# Patient Record
Sex: Male | Born: 1955
Health system: Southern US, Community
[De-identification: ages and names within clinical notes are randomized; demographics above are authoritative.]

## PROBLEM LIST (undated history)

## (undated) DIAGNOSIS — E7800 Pure hypercholesterolemia, unspecified: Secondary | ICD-10-CM

## (undated) DIAGNOSIS — Z85828 Personal history of other malignant neoplasm of skin: Secondary | ICD-10-CM

## (undated) DIAGNOSIS — F419 Anxiety disorder, unspecified: Secondary | ICD-10-CM

## (undated) DIAGNOSIS — Z8489 Family history of other specified conditions: Secondary | ICD-10-CM

## (undated) DIAGNOSIS — G4733 Obstructive sleep apnea (adult) (pediatric): Secondary | ICD-10-CM

## (undated) DIAGNOSIS — E785 Hyperlipidemia, unspecified: Secondary | ICD-10-CM

## (undated) DIAGNOSIS — Z9289 Personal history of other medical treatment: Secondary | ICD-10-CM

## (undated) DIAGNOSIS — R351 Nocturia: Secondary | ICD-10-CM

## (undated) DIAGNOSIS — E291 Testicular hypofunction: Secondary | ICD-10-CM

## (undated) DIAGNOSIS — C801 Malignant (primary) neoplasm, unspecified: Secondary | ICD-10-CM

## (undated) DIAGNOSIS — G4730 Sleep apnea, unspecified: Secondary | ICD-10-CM

## (undated) DIAGNOSIS — K224 Dyskinesia of esophagus: Secondary | ICD-10-CM

## (undated) DIAGNOSIS — M1990 Unspecified osteoarthritis, unspecified site: Secondary | ICD-10-CM

## (undated) DIAGNOSIS — M109 Gout, unspecified: Secondary | ICD-10-CM

## (undated) DIAGNOSIS — I10 Essential (primary) hypertension: Secondary | ICD-10-CM

## (undated) DIAGNOSIS — I2510 Atherosclerotic heart disease of native coronary artery without angina pectoris: Secondary | ICD-10-CM

## (undated) DIAGNOSIS — M67921 Unspecified disorder of synovium and tendon, right upper arm: Secondary | ICD-10-CM

## (undated) DIAGNOSIS — E790 Hyperuricemia without signs of inflammatory arthritis and tophaceous disease: Secondary | ICD-10-CM

## (undated) DIAGNOSIS — M25511 Pain in right shoulder: Secondary | ICD-10-CM

## (undated) DIAGNOSIS — E039 Hypothyroidism, unspecified: Secondary | ICD-10-CM

## (undated) DIAGNOSIS — Z8719 Personal history of other diseases of the digestive system: Secondary | ICD-10-CM

## (undated) DIAGNOSIS — R7303 Prediabetes: Secondary | ICD-10-CM

## (undated) DIAGNOSIS — M25512 Pain in left shoulder: Secondary | ICD-10-CM

## (undated) DIAGNOSIS — M75101 Unspecified rotator cuff tear or rupture of right shoulder, not specified as traumatic: Secondary | ICD-10-CM

## (undated) DIAGNOSIS — K219 Gastro-esophageal reflux disease without esophagitis: Secondary | ICD-10-CM

## (undated) HISTORY — PX: CARPAL TUNNEL RELEASE: SHX101

## (undated) HISTORY — PX: SHOULDER SURGERY: SHX246

## (undated) HISTORY — PX: SHOULDER ARTHROSCOPY: SHX128

## (undated) HISTORY — PX: FOOT SURGERY: SHX648

## (undated) HISTORY — PX: KNEE SURGERY: SHX244

## (undated) HISTORY — PX: CATARACT EXTRACTION W/ INTRAOCULAR LENS  IMPLANT, BILATERAL: SHX1307

## (undated) HISTORY — PX: ANAL FISSURE REPAIR: SHX2312

## (undated) HISTORY — PX: SHOULDER ARTHROSCOPY WITH ROTATOR CUFF REPAIR AND SUBACROMIAL DECOMPRESSION: SHX5686

## (undated) HISTORY — PX: PLANTAR FASCIA SURGERY: SHX746

## (undated) HISTORY — DX: Testicular hypofunction: E29.1

## (undated) HISTORY — PX: PR UNLISTED PROCEDURE SHOULDER: 23929

## (undated) HISTORY — DX: Hypothyroidism, unspecified: E03.9

## (undated) HISTORY — DX: Unspecified osteoarthritis, unspecified site: M19.90

## (undated) HISTORY — PX: CARPAL TUNNEL RELEASE: SHX5017

## (undated) HISTORY — DX: Essential (primary) hypertension: I10

## (undated) HISTORY — PX: SURGICAL HX OTHER: 99

## (undated) HISTORY — PX: PR UNLISTED PROCEDURE FEMUR/KNEE: 27599

---
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
## 2002-05-20 ENCOUNTER — Emergency Department (HOSPITAL_COMMUNITY): Admission: EM | Admit: 2002-05-20 | Discharge: 2002-05-20 | Payer: Self-pay | Admitting: Emergency Medicine

## 2002-05-20 ENCOUNTER — Encounter: Payer: Self-pay | Admitting: Emergency Medicine

## 2002-05-29 ENCOUNTER — Encounter: Admission: RE | Admit: 2002-05-29 | Discharge: 2002-05-29 | Payer: Self-pay | Admitting: Internal Medicine

## 2002-05-29 ENCOUNTER — Encounter: Payer: Self-pay | Admitting: Internal Medicine

## 2003-03-04 ENCOUNTER — Encounter: Admission: RE | Admit: 2003-03-04 | Discharge: 2003-03-04 | Payer: Self-pay | Admitting: *Deleted

## 2003-03-04 ENCOUNTER — Encounter: Payer: Self-pay | Admitting: *Deleted

## 2003-03-05 ENCOUNTER — Encounter: Admission: RE | Admit: 2003-03-05 | Discharge: 2003-03-05 | Payer: Self-pay | Admitting: Internal Medicine

## 2003-03-05 ENCOUNTER — Encounter: Payer: Self-pay | Admitting: Internal Medicine

## 2003-07-25 ENCOUNTER — Emergency Department (HOSPITAL_COMMUNITY): Admission: AD | Admit: 2003-07-25 | Discharge: 2003-07-25 | Payer: Self-pay | Admitting: Family Medicine

## 2004-07-17 ENCOUNTER — Encounter: Admission: RE | Admit: 2004-07-17 | Discharge: 2004-07-17 | Payer: Self-pay | Admitting: Internal Medicine

## 2004-07-31 ENCOUNTER — Encounter: Admission: RE | Admit: 2004-07-31 | Discharge: 2004-08-15 | Payer: Self-pay | Admitting: Neurosurgery

## 2006-06-18 DIAGNOSIS — Z8719 Personal history of other diseases of the digestive system: Secondary | ICD-10-CM

## 2006-06-18 HISTORY — DX: Personal history of other diseases of the digestive system: Z87.19

## 2006-11-05 ENCOUNTER — Ambulatory Visit: Payer: Self-pay | Admitting: Family Medicine

## 2006-11-19 ENCOUNTER — Ambulatory Visit: Payer: Self-pay | Admitting: Family Medicine

## 2006-11-19 LAB — CONVERTED CEMR LAB
ALT: 33 units/L (ref 0–40)
AST: 41 units/L — ABNORMAL HIGH (ref 0–37)
Albumin: 4.2 g/dL (ref 3.5–5.2)
Alkaline Phosphatase: 53 units/L (ref 39–117)
Basophils Absolute: 0 10*3/uL (ref 0.0–0.1)
Calcium: 9.3 mg/dL (ref 8.4–10.5)
Chloride: 101 meq/L (ref 96–112)
Eosinophils Absolute: 0.1 10*3/uL (ref 0.0–0.6)
GFR calc non Af Amer: 74 mL/min
HDL: 40.1 mg/dL (ref >39.0)
MCHC: 34.7 g/dL (ref 30.0–36.0)
MCV: 89.8 fL (ref 78.0–100.0)
Platelets: 197 10*3/uL (ref 150–400)
RBC: 4.61 M/uL (ref 4.22–5.81)
RDW: 13.6 % (ref 11.5–14.6)
TSH: >100 microintl units/mL — ABNORMAL HIGH (ref 0.35–5.50)
Total CHOL/HDL Ratio: 7.2
Triglycerides: 162 mg/dL — ABNORMAL HIGH (ref 0–149)
WBC: 6.7 10*3/uL (ref 4.5–10.5)

## 2006-11-26 ENCOUNTER — Ambulatory Visit: Payer: Self-pay | Admitting: Family Medicine

## 2006-12-11 ENCOUNTER — Ambulatory Visit: Payer: Self-pay | Admitting: Pulmonary Disease

## 2006-12-26 ENCOUNTER — Encounter: Payer: Self-pay | Admitting: Family Medicine

## 2007-01-13 ENCOUNTER — Ambulatory Visit (HOSPITAL_BASED_OUTPATIENT_CLINIC_OR_DEPARTMENT_OTHER): Admission: RE | Admit: 2007-01-13 | Discharge: 2007-01-13 | Payer: Self-pay | Admitting: Pulmonary Disease

## 2007-01-29 ENCOUNTER — Ambulatory Visit: Payer: Self-pay | Admitting: Pulmonary Disease

## 2007-02-03 ENCOUNTER — Ambulatory Visit: Payer: Self-pay | Admitting: Pulmonary Disease

## 2007-03-05 ENCOUNTER — Ambulatory Visit: Payer: Self-pay | Admitting: Family Medicine

## 2007-03-05 DIAGNOSIS — E119 Type 2 diabetes mellitus without complications: Secondary | ICD-10-CM | POA: Insufficient documentation

## 2007-03-05 DIAGNOSIS — E039 Hypothyroidism, unspecified: Secondary | ICD-10-CM | POA: Insufficient documentation

## 2007-03-05 DIAGNOSIS — M1990 Unspecified osteoarthritis, unspecified site: Secondary | ICD-10-CM | POA: Insufficient documentation

## 2007-03-05 DIAGNOSIS — E782 Mixed hyperlipidemia: Secondary | ICD-10-CM | POA: Insufficient documentation

## 2007-03-05 DIAGNOSIS — F329 Major depressive disorder, single episode, unspecified: Secondary | ICD-10-CM | POA: Insufficient documentation

## 2007-03-05 DIAGNOSIS — E785 Hyperlipidemia, unspecified: Secondary | ICD-10-CM

## 2007-03-05 DIAGNOSIS — I10 Essential (primary) hypertension: Secondary | ICD-10-CM | POA: Insufficient documentation

## 2007-03-06 ENCOUNTER — Ambulatory Visit: Payer: Self-pay | Admitting: Pulmonary Disease

## 2007-03-07 ENCOUNTER — Encounter: Payer: Self-pay | Admitting: Family Medicine

## 2007-03-07 LAB — CONVERTED CEMR LAB
Alkaline Phosphatase: 45 units/L (ref 39–117)
Cholesterol: 116 mg/dL (ref 0–200)
Creatinine,U: 141.2 mg/dL
HDL: 30.2 mg/dL — ABNORMAL LOW (ref >39.0)
Microalb, Ur: 0.2 mg/dL (ref 0.0–1.9)
Total Bilirubin: 0.5 mg/dL (ref 0.3–1.2)
Total CHOL/HDL Ratio: 3.8
Total Protein: 6.3 g/dL (ref 6.0–8.3)
Triglycerides: 88 mg/dL (ref 0–149)

## 2007-05-06 ENCOUNTER — Telehealth (INDEPENDENT_AMBULATORY_CARE_PROVIDER_SITE_OTHER): Payer: Self-pay | Admitting: *Deleted

## 2007-05-09 ENCOUNTER — Ambulatory Visit: Payer: Self-pay | Admitting: Family Medicine

## 2007-05-09 DIAGNOSIS — J018 Other acute sinusitis: Secondary | ICD-10-CM | POA: Insufficient documentation

## 2007-06-02 ENCOUNTER — Encounter (INDEPENDENT_AMBULATORY_CARE_PROVIDER_SITE_OTHER): Payer: Self-pay | Admitting: *Deleted

## 2007-06-16 ENCOUNTER — Ambulatory Visit: Payer: Self-pay | Admitting: Pulmonary Disease

## 2007-06-16 DIAGNOSIS — G4733 Obstructive sleep apnea (adult) (pediatric): Secondary | ICD-10-CM | POA: Insufficient documentation

## 2007-06-17 ENCOUNTER — Ambulatory Visit: Payer: Self-pay | Admitting: Family Medicine

## 2007-06-17 DIAGNOSIS — M722 Plantar fascial fibromatosis: Secondary | ICD-10-CM | POA: Insufficient documentation

## 2007-09-17 ENCOUNTER — Ambulatory Visit: Payer: Self-pay | Admitting: Family Medicine

## 2007-12-13 ENCOUNTER — Encounter: Payer: Self-pay | Admitting: Pulmonary Disease

## 2007-12-17 ENCOUNTER — Ambulatory Visit: Payer: Self-pay | Admitting: Family Medicine

## 2008-02-03 ENCOUNTER — Ambulatory Visit: Payer: Self-pay | Admitting: Family Medicine

## 2008-02-03 LAB — CONVERTED CEMR LAB
Blood in Urine, dipstick: NEGATIVE
Glucose, Urine, Semiquant: NEGATIVE
Protein, U semiquant: NEGATIVE
WBC Urine, dipstick: NEGATIVE
pH: 7

## 2008-02-05 LAB — CONVERTED CEMR LAB
AST: 24 units/L (ref 0–37)
Alkaline Phosphatase: 47 units/L (ref 39–117)
Basophils Absolute: 0 10*3/uL (ref 0.0–0.1)
Chloride: 103 meq/L (ref 96–112)
Eosinophils Absolute: 0.1 10*3/uL (ref 0.0–0.7)
GFR calc non Af Amer: 83 mL/min
HDL: 35.3 mg/dL — ABNORMAL LOW (ref >39.0)
Hgb A1c MFr Bld: 6.2 % — ABNORMAL HIGH (ref 4.6–6.0)
MCHC: 35 g/dL (ref 30.0–36.0)
MCV: 91 fL (ref 78.0–100.0)
Microalb Creat Ratio: 1.3 mg/g (ref 0.0–30.0)
Neutrophils Relative %: 49 % (ref 43.0–77.0)
PSA: 0.4 ng/mL (ref 0.10–4.00)
Platelets: 231 10*3/uL (ref 150–400)
Potassium: 3.7 meq/L (ref 3.5–5.1)
RDW: 12.4 % (ref 11.5–14.6)
Sodium: 139 meq/L (ref 135–145)
Total Bilirubin: 0.7 mg/dL (ref 0.3–1.2)
VLDL: 14 mg/dL (ref 0–40)
WBC: 4.8 10*3/uL (ref 4.5–10.5)

## 2008-02-10 ENCOUNTER — Ambulatory Visit: Payer: Self-pay | Admitting: Family Medicine

## 2008-04-20 ENCOUNTER — Telehealth: Payer: Self-pay | Admitting: Family Medicine

## 2008-06-18 HISTORY — PX: EYE SURGERY: SHX253

## 2008-06-25 ENCOUNTER — Telehealth: Payer: Self-pay | Admitting: Family Medicine

## 2008-07-06 ENCOUNTER — Ambulatory Visit: Payer: Self-pay | Admitting: Family Medicine

## 2008-07-14 ENCOUNTER — Encounter: Payer: Self-pay | Admitting: Family Medicine

## 2008-08-02 ENCOUNTER — Encounter: Payer: Self-pay | Admitting: Family Medicine

## 2008-08-02 ENCOUNTER — Ambulatory Visit (HOSPITAL_COMMUNITY): Admission: RE | Admit: 2008-08-02 | Discharge: 2008-08-03 | Payer: Self-pay | Admitting: Orthopedic Surgery

## 2008-08-12 ENCOUNTER — Encounter: Payer: Self-pay | Admitting: Family Medicine

## 2009-01-11 ENCOUNTER — Telehealth: Payer: Self-pay | Admitting: Family Medicine

## 2009-02-01 ENCOUNTER — Ambulatory Visit: Payer: Self-pay | Admitting: Family Medicine

## 2009-02-01 LAB — CONVERTED CEMR LAB
Bilirubin Urine: NEGATIVE
Glucose, Urine, Semiquant: NEGATIVE
Urobilinogen, UA: 0.2
WBC Urine, dipstick: NEGATIVE
pH: 7

## 2009-02-02 LAB — CONVERTED CEMR LAB
ALT: 31 units/L (ref 0–53)
AST: 32 units/L (ref 0–37)
Albumin: 3.9 g/dL (ref 3.5–5.2)
BUN: 21 mg/dL (ref 6–23)
Basophils Relative: 0.7 % (ref 0.0–3.0)
Chloride: 109 meq/L (ref 96–112)
Cholesterol: 133 mg/dL (ref 0–200)
Eosinophils Relative: 2.5 % (ref 0.0–5.0)
HCT: 37.8 % — ABNORMAL LOW (ref 39.0–52.0)
Hemoglobin: 13.2 g/dL (ref 13.0–17.0)
Hgb A1c MFr Bld: 6 % (ref 4.6–6.5)
LDL Cholesterol: 75 mg/dL (ref 0–99)
Lymphs Abs: 2.2 10*3/uL (ref 0.7–4.0)
MCV: 92.2 fL (ref 78.0–100.0)
Monocytes Absolute: 0.5 10*3/uL (ref 0.1–1.0)
Monocytes Relative: 7.8 % (ref 3.0–12.0)
Neutro Abs: 3.1 10*3/uL (ref 1.4–7.7)
Platelets: 205 10*3/uL (ref 150.0–400.0)
Potassium: 4.1 meq/L (ref 3.5–5.1)
Sodium: 143 meq/L (ref 135–145)
TSH: 2.79 microintl units/mL (ref 0.35–5.50)
Total Protein: 7 g/dL (ref 6.0–8.3)
WBC: 5.9 10*3/uL (ref 4.5–10.5)

## 2009-02-09 ENCOUNTER — Telehealth: Payer: Self-pay | Admitting: Family Medicine

## 2009-02-10 ENCOUNTER — Ambulatory Visit: Payer: Self-pay | Admitting: Family Medicine

## 2009-02-11 ENCOUNTER — Telehealth: Payer: Self-pay | Admitting: Family Medicine

## 2009-02-11 LAB — CONVERTED CEMR LAB: Testosterone: 254.73 ng/dL — ABNORMAL LOW (ref 350.00–890.00)

## 2009-02-15 ENCOUNTER — Ambulatory Visit: Payer: Self-pay | Admitting: Family Medicine

## 2009-02-15 DIAGNOSIS — E291 Testicular hypofunction: Secondary | ICD-10-CM | POA: Insufficient documentation

## 2009-02-16 ENCOUNTER — Telehealth (INDEPENDENT_AMBULATORY_CARE_PROVIDER_SITE_OTHER): Payer: Self-pay | Admitting: *Deleted

## 2009-02-17 ENCOUNTER — Ambulatory Visit: Payer: Self-pay

## 2009-02-17 ENCOUNTER — Encounter: Payer: Self-pay | Admitting: Cardiology

## 2009-02-17 DIAGNOSIS — Z9289 Personal history of other medical treatment: Secondary | ICD-10-CM

## 2009-02-17 HISTORY — DX: Personal history of other medical treatment: Z92.89

## 2009-02-18 ENCOUNTER — Telehealth: Payer: Self-pay | Admitting: Family Medicine

## 2009-02-23 ENCOUNTER — Telehealth: Payer: Self-pay | Admitting: Family Medicine

## 2009-03-15 ENCOUNTER — Telehealth: Payer: Self-pay | Admitting: Family Medicine

## 2009-04-14 ENCOUNTER — Ambulatory Visit (HOSPITAL_COMMUNITY): Admission: RE | Admit: 2009-04-14 | Discharge: 2009-04-14 | Payer: Self-pay | Admitting: Orthopedic Surgery

## 2009-06-18 HISTORY — PX: LEG SURGERY: SHX1003

## 2009-11-18 ENCOUNTER — Ambulatory Visit: Payer: Self-pay | Admitting: Family Medicine

## 2009-11-18 DIAGNOSIS — K089 Disorder of teeth and supporting structures, unspecified: Secondary | ICD-10-CM | POA: Insufficient documentation

## 2009-11-18 DIAGNOSIS — J019 Acute sinusitis, unspecified: Secondary | ICD-10-CM | POA: Insufficient documentation

## 2009-11-24 ENCOUNTER — Telehealth: Payer: Self-pay | Admitting: Family Medicine

## 2009-12-05 ENCOUNTER — Telehealth: Payer: Self-pay | Admitting: Family Medicine

## 2010-02-07 ENCOUNTER — Ambulatory Visit: Payer: Self-pay | Admitting: Family Medicine

## 2010-02-07 LAB — CONVERTED CEMR LAB
Bilirubin Urine: NEGATIVE
Blood in Urine, dipstick: NEGATIVE
Glucose, Urine, Semiquant: NEGATIVE
Protein, U semiquant: NEGATIVE
Specific Gravity, Urine: 1.025
WBC Urine, dipstick: NEGATIVE
pH: 6

## 2010-02-09 LAB — CONVERTED CEMR LAB
Albumin: 4 g/dL (ref 3.5–5.2)
BUN: 21 mg/dL (ref 6–23)
Basophils Absolute: 0 10*3/uL (ref 0.0–0.1)
CO2: 29 meq/L (ref 19–32)
Calcium: 9.2 mg/dL (ref 8.4–10.5)
Cholesterol: 122 mg/dL (ref 0–200)
Creatinine,U: 244.2 mg/dL
Eosinophils Absolute: 0.1 10*3/uL (ref 0.0–0.7)
GFR calc non Af Amer: 95.11 mL/min (ref >60)
Glucose, Bld: 96 mg/dL (ref 70–99)
HCT: 40.9 % (ref 39.0–52.0)
HDL: 39.7 mg/dL (ref >39.00)
Hemoglobin: 13.8 g/dL (ref 13.0–17.0)
Lymphs Abs: 2.6 10*3/uL (ref 0.7–4.0)
MCHC: 33.8 g/dL (ref 30.0–36.0)
Microalb Creat Ratio: 0.3 mg/g (ref 0.0–30.0)
Microalb, Ur: 0.7 mg/dL (ref 0.0–1.9)
Neutro Abs: 2.7 10*3/uL (ref 1.4–7.7)
PSA: 0.38 ng/mL (ref 0.10–4.00)
Platelets: 224 10*3/uL (ref 150.0–400.0)
Potassium: 3.9 meq/L (ref 3.5–5.1)
RDW: 13.4 % (ref 11.5–14.6)
Sodium: 140 meq/L (ref 135–145)
TSH: 3.82 microintl units/mL (ref 0.35–5.50)
Total Bilirubin: 0.6 mg/dL (ref 0.3–1.2)
VLDL: 15.8 mg/dL (ref 0.0–40.0)

## 2010-02-13 ENCOUNTER — Ambulatory Visit: Payer: Self-pay | Admitting: Family Medicine

## 2010-03-21 ENCOUNTER — Telehealth: Payer: Self-pay | Admitting: Family Medicine

## 2010-04-06 ENCOUNTER — Encounter: Payer: Self-pay | Admitting: Family Medicine

## 2010-04-27 ENCOUNTER — Ambulatory Visit: Payer: Self-pay | Admitting: Pulmonary Disease

## 2010-04-28 ENCOUNTER — Encounter: Admission: RE | Admit: 2010-04-28 | Discharge: 2010-04-28 | Payer: Self-pay | Admitting: Anesthesiology

## 2010-05-03 ENCOUNTER — Encounter: Admission: RE | Admit: 2010-05-03 | Discharge: 2010-05-03 | Payer: Self-pay | Admitting: Anesthesiology

## 2010-07-18 NOTE — Assessment & Plan Note (Signed)
Summary: acute sick visit for cpap issues.   Visit Type:  Follow-up  CC:  pt last seen 05/2007. pt states he is not being able to sleep using the cpap machine. pt is not using cpap currently at all. Marland Kitchen  History of Present Illness: the pt comes in today for f/u of his severe osa.  He has not been seen since 2008, and has not been using cpap due to pressure intolerance.  He has tried desensitization in the past, but is unable to tolerate the pressure on exhalation.  His symptomatology has really not improved from the prior visit, obviously because he is not wearing cpap.  His weight is neutral from the last visit in 2008 as well.  Current Medications (verified): 1)  Crestor 10 Mg  Tabs (Rosuvastatin Calcium) .... Once Daily 2)  Diovan Hct 160-25 Mg  Tabs (Valsartan-Hydrochlorothiazide) .... Once Daily 3)  Cymbalta 60 Mg  Cpep (Duloxetine Hcl) .... Once Daily 4)  Melatonin 5 Mg Tabs (Melatonin) .Marland Kitchen.. 1 By Mouth At Bedtime 5)  Synthroid 150 Mcg Tabs (Levothyroxine Sodium) .... Once Daily 6)  Fish Oil   Oil (Fish Oil) .... Once Daily 7)  Vitamin D 1000 Unit  Tabs (Cholecalciferol) .... 2 Once Daily 8)  Calcium 1500 Mg Tabs (Calcium Carbonate) .... Once Daily 9)  Multivitamins   Tabs (Multiple Vitamin) .... Once Daily 10)  Celebrex 200 Mg Caps (Celecoxib) .... Once Daily 11)  Testim 1 % Gel (Testosterone) .Marland Kitchen.. 10 Grams Once Daily 12)  Aspir-Low 81 Mg Tbec (Aspirin) .Marland Kitchen.. 1 Once Daily 13)  Voltaren 0.1 % Soln (Diclofenac Sodium) .... As Needed 14)  Glucosamine Chondr 1500 Complx  Caps (Glucosamine-Chondroit-Vit C-Mn) .... Once Daily  Allergies (verified): 1)  ! Sulfa 2)  ! Sulfa  Review of Systems       The patient complains of shortness of breath with activity.  The patient denies shortness of breath at rest, productive cough, non-productive cough, coughing up blood, chest pain, irregular heartbeats, acid heartburn, indigestion, loss of appetite, weight change, abdominal pain, difficulty  swallowing, sore throat, tooth/dental problems, headaches, nasal congestion/difficulty breathing through nose, sneezing, itching, ear ache, anxiety, depression, hand/feet swelling, joint stiffness or pain, rash, change in color of mucus, and fever.    Vital Signs:  Patient profile:   55 year old male Height:      73.5 inches Weight:      266.50 pounds O2 Sat:      96 % on Room air Temp:     98.1 degrees F oral Pulse rate:   65 / minute BP sitting:   140 / 80  (left arm) Cuff size:   large  Vitals Entered By: Carver Fila (April 27, 2010 3:27 PM)  O2 Flow:  Room air CC: pt last seen 05/2007. pt states he is not being able to sleep using the cpap machine. pt is not using cpap currently at all.  Comments meds and allergies updated Phone number updated Carver Fila  April 27, 2010 3:28 PM    Physical Exam  General:  ow male in nad Nose:  no skin breakdown or pressure necrosis from cpap mask Extremities:  no edema or cyanosis  Neurologic:  alert and oriented, moves all 4. does not appear sleepy.   Impression & Recommendations:  Problem # 1:  OBSTRUCTIVE SLEEP APNEA (ICD-327.23) the pt has a h/o severe osa, and has not been able to tolerate cpap.  His main complaint is that of difficulty with exhalation, and  this has continued to be an issue even with desensitization.  I think he would benefit from a change to bilevel given his severe osa and failure to tolerate cpap.  He is willing to give this a try.  I have also encouraged him to work aggressively on weight loss.  Medications Added to Medication List This Visit: 1)  Melatonin 5 Mg Tabs (Melatonin) .Marland Kitchen.. 1 by mouth at bedtime 2)  Voltaren 0.1 % Soln (Diclofenac sodium) .... As needed 3)  Glucosamine Chondr 1500 Complx Caps (Glucosamine-chondroit-vit c-mn) .... Once daily  Other Orders: Est. Patient Level [name] (74259) DME Referral (DME)  Patient Instructions: 1)  will change cpap over to bipap...will start at lower end, and  gradually work up to optimal level.  2)  will get you a new mask...do not make alterations to the structure 3)  work on weight loss 4)  followup with me in 5 weeks (insurance requires face to face after 30 days).   Immunization History:  Influenza Immunization History:    Influenza:  historical (03/18/2010)

## 2010-07-18 NOTE — Assessment & Plan Note (Signed)
Summary: CPX // RS   Vital Signs:  Patient profile:   55 year old male Weight:      262 pounds BMI:     34.22 BP sitting:   120 / 84  (left arm) Cuff size:   regular  Vitals Entered By: Raechel Ache, RN (February 13, 2010 10:16 AM) CC: CPX, labs done. Had Myoview 9/10. C/o R knee pain - sees ortho   History of Present Illness: 55 yr old male for a cpx. He feels fine in general with no complaints. he has decreased his celebrex to once a day.   Allergies: 1)  ! Sulfa 2)  ! Sulfa  Past History:  Past Medical History: Reviewed history from 11/18/2009 and no changes required. HYPERTENSION (ICD-401.9) HYPERLIPIDEMIA (ICD-272.4) DEPRESSION (ICD-311) PLANTAR FASCIITIS (ICD-728.71) OBSTRUCTIVE SLEEP APNEA (ICD-327.23), sees Dr. Shelle Iron OTHER ACUTE SINUSITIS (ICD-461.8) Hypothyroidism Osteoarthritis perianal abscess normal stress test 12-06 Diabetes mellitus, type II low testosterone sees Dr. Lovenia Kim for dermatology exams  Past Surgical History: Reviewed history from 11/18/2009 and no changes required. arthroscopy to both knees right knee open surgery twice colonoscopy 2008 per Dr. Kinnie Scales, repeat in 5 years left gastrocnemius release for plantar fasciitis 08-02-08 per Dr. Lestine Box.  Synvisc injections and cortisone shots to right knee per Dr. Lequita Halt basal cell removed from forehead with MOHS per Dr. Irene Limbo  right shoulder arthroscopy 07-2009 per Dr. Rennis Chris  Family History: Reviewed history from 02/10/2008 and no changes required. Family History of CAD Male 1st degree relative <50 Family History Diabetes 1st degree relative Family History High cholesterol Family History Hypertension Family History Kidney disease Family History of Skin cancer dementia, including Alzheimers and multi-infarct  Social History: Reviewed history from 02/10/2008 and no changes required. 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    Physical Exam  General:  overweight-appearing.   Head:  Normocephalic and atraumatic without obvious abnormalities. No apparent alopecia or balding. Eyes:  No corneal or conjunctival inflammation noted. EOMI. Perrla. Funduscopic exam benign, without hemorrhages, exudates or papilledema. Vision grossly normal. Ears:  External ear exam shows no significant lesions or deformities.  Otoscopic examination reveals clear canals, tympanic membranes are intact bilaterally without bulging, retraction, inflammation or discharge. Hearing is grossly normal bilaterally. Nose:  External nasal examination shows no deformity or inflammation. Nasal mucosa are pink and moist without lesions or exudates. Mouth:  Oral mucosa and oropharynx without lesions or exudates.  Teeth in good repair. Neck:  No deformities, masses, or tenderness noted. Chest Wall:  No deformities, masses, tenderness or gynecomastia noted. Lungs:  Normal respiratory effort, chest expands symmetrically. Lungs are clear to auscultation, no crackles or wheezes. Heart:  Normal rate and regular rhythm. S1 and S2 normal without gallop, murmur, click, rub or other extra sounds. EKG normal Abdomen:  Bowel sounds positive,abdomen soft and non-tender without masses, organomegaly or hernias noted. Rectal:  No external abnormalities noted. Normal sphincter tone. No rectal masses or tenderness. Heme neg Genitalia:   Testes bilaterally descended without nodularity, tenderness or masses. No scrotal masses or lesions. No penis lesions or  urethral discharge. Prostate:  Prostate gland firm and smooth, no enlargement, nodularity, tenderness, mass, asymmetry or induration. Msk:  No deformity or scoliosis noted of thoracic or lumbar spine.   Pulses:  R and L carotid,radial,femoral,dorsalis pedis and posterior tibial pulses are full and equal bilaterally Extremities:  No clubbing, cyanosis, edema, or deformity noted with normal full range of motion of all joints.   Neurologic:  No cranial nerve deficits noted. Station and gait are normal. Plantar reflexes are down-going bilaterally. DTRs are symmetrical throughout. Sensory, motor and coordinative functions appear intact. Skin:  Intact without suspicious lesions or rashes Cervical Nodes:  No lymphadenopathy noted Axillary Nodes:  No palpable lymphadenopathy Inguinal Nodes:  No significant adenopathy Psych:  Cognition and judgment appear intact. Alert and cooperative with normal attention span and concentration. No apparent delusions, illusions, hallucinations   Impression & Recommendations:  Problem # 1:  WELL ADULT EXAM (ICD-V70.0)  Orders: Hemoccult Guaiac-1 spec.(in office) (82270) EKG w/ Interpretation (93000)  Complete Medication List: 1)  Crestor 10 Mg Tabs (Rosuvastatin calcium) .... Once daily 2)  Diovan Hct 160-25 Mg Tabs (Valsartan-hydrochlorothiazide) .... Once daily 3)  Cymbalta 60 Mg Cpep (Duloxetine hcl) .... Once daily 4)  Benadryl 25 Mg Tabs (Diphenhydramine hcl) .Marland Kitchen.. 1 by mouth at bedtime 5)  Synthroid 150 Mcg Tabs (Levothyroxine sodium) .... Once daily 6)  Fish Oil Oil (Fish oil) .... Once daily 7)  Vitamin D 1000 Unit Tabs (Cholecalciferol) .... 2 once daily 8)  Calcium 1500 Mg Tabs (Calcium carbonate) .... Once daily 9)  Multivitamins Tabs (Multiple vitamin) .... Once daily 10)  Celebrex 200 Mg Caps (Celecoxib) .... Once daily 11)   Testim 1 % Gel (Testosterone) .Marland Kitchen.. 10 grams once daily 12)  Aspir-low 81 Mg Tbec (Aspirin) .Marland Kitchen.. 1 once daily  Patient Instructions: 1)  Please schedule a follow-up appointment in 6 months .  2)  It is important that you exercise reguarly at least 20 minutes 5 times a week. If you develop chest pain, have severe difficulty breathing, or feel very tired, stop exercising immediately and seek medical attention.  3)  You need to lose weight. Consider a lower calorie diet and regular exercise.  Prescriptions: CELEBREX 200 MG CAPS (CELECOXIB) once daily  #90 x 3   Entered and Authorized by:   [name]   Signed by:   [name] on 02/13/2010   Method used:   Print then Give to Patient   RxID:   [id number] SYNTHROID 150 MCG TABS (LEVOTHYROXINE SODIUM) once daily  #90 x 3   Entered and Authorized by:   [name]   Signed by:   [name] on 02/13/2010   Method used:   Print then Give to Patient   RxID:   [id number] CRESTOR 10 MG  TABS (ROSUVASTATIN CALCIUM) once daily  #90 x 3   Entered and Authorized by:   [name]   Signed by:   [name] on 02/13/2010   Method used:   Print then Give to Patient   RxID:   [id number]    Preventive Care Screening  Colonoscopy:    Date:  06/18/2006    Results:  normal

## 2010-07-18 NOTE — Consult Note (Signed)
Summary: [facility]  [facility]   Imported By: Maryln Gottron 04/12/2010 09:39:31  _____________________________________________________________________  External Attachment:    Type:   Image     Comment:   External Document

## 2010-07-18 NOTE — Progress Notes (Signed)
Summary: Androgel not covered  Phone Note Call from Patient Call back at [phone]   Caller: Patient Call For: [name] Summary of Call: insurance will not cover Androgel, say will pay for Testim Initial call taken by: Raechel Ache, RN,  November 24, 2009 9:55 AM  Follow-up for Phone Call        [facility] [address] Clent Ridges out of office- insurance will probably send [place] a fax. Follow-up by: Raechel Ache, RN,  November 24, 2009 9:56 AM  Additional Follow-up for Phone Call Additional follow up Details #1::        wants Rx for Testim Additional Follow-up by: Raechel Ache, RN,  November 30, 2009 8:22 AM    Additional Follow-up for Phone Call Additional follow up Details #2::    okay. Switch to testim gel. Apply 10 grams a day. Call in a 6 month supply Follow-up by: [name],  November 30, 2009 12:53 PM  Additional Follow-up for Phone Call Additional follow up Details #3:: Details for Additional Follow-up Action Taken: LMTOCB-need to know what pharmacy to call rx into Romualdo Bolk, CMA (AAMA)  November 30, 2009 2:25 PM   New/Updated Medications: TESTIM 1 % GEL (TESTOSTERONE) 10 grams once daily

## 2010-07-18 NOTE — Assessment & Plan Note (Signed)
Summary: sorethroat/gum inflamed/cjr   Vital Signs:  Patient profile:   55 year old male Weight:      262 pounds BMI:     34.22 Temp:     98.2 degrees F oral BP sitting:   138 / 90  (left arm) Cuff size:   regular  Vitals Entered By: Raechel Ache, RN (November 18, 2009 10:44 AM) CC: C/o sore spot on gum and then developed sore throat. Has dental appt next week.   History of Present Illness: Here for a ST which began about 5 days ago. He also has sinus pressure, PND, and a dry cough. No fever. Incidentally he also developed a painful tooth on the left upper jaw a few days ago. He will see his dentist early next week.   Allergies: 1)  ! Sulfa 2)  ! Sulfa  Past History:  Past Medical History: HYPERTENSION (ICD-401.9) HYPERLIPIDEMIA (ICD-272.4) DEPRESSION (ICD-311) PLANTAR FASCIITIS (ICD-728.71) OBSTRUCTIVE SLEEP APNEA (ICD-327.23), sees Dr. Shelle Iron OTHER ACUTE SINUSITIS (ICD-461.8) Hypothyroidism Osteoarthritis perianal abscess normal stress test 12-06 Diabetes mellitus, type II low testosterone sees Dr. Lovenia Kim for dermatology exams  Past Surgical History: arthroscopy to both knees right knee open surgery twice colonoscopy 2008 per Dr. Kinnie Scales, repeat in 5 years left gastrocnemius release for plantar fasciitis 08-02-08 per Dr. Lestine Box.  Synvisc injections and cortisone shots to right knee per Dr. Lequita Halt basal cell removed from forehead with MOHS per Dr. Irene Limbo  right shoulder arthroscopy 07-2009 per Dr. Rennis Chris  Review of Systems  The patient denies anorexia, fever, weight loss, weight gain, vision loss, decreased hearing, hoarseness, chest pain, syncope, dyspnea on exertion, peripheral edema, prolonged cough, headaches, hemoptysis, abdominal pain, melena, hematochezia, severe indigestion/heartburn, hematuria, incontinence, genital sores, muscle weakness, suspicious skin lesions, transient blindness, difficulty walking, depression, unusual weight change, abnormal  bleeding, enlarged lymph nodes, angioedema, breast masses, and testicular masses.    Physical Exam  General:  Well-developed,well-nourished,in no acute distress; alert,appropriate and cooperative throughout examination Head:  Normocephalic and atraumatic without obvious abnormalities. No apparent alopecia or balding. Eyes:  No corneal or conjunctival inflammation noted. EOMI. Perrla. Funduscopic exam benign, without hemorrhages, exudates or papilledema. Vision grossly normal. Ears:  External ear exam shows no significant lesions or deformities.  Otoscopic examination reveals clear canals, tympanic membranes are intact bilaterally without bulging, retraction, inflammation or discharge. Hearing is grossly normal bilaterally. Nose:  External nasal examination shows no deformity or inflammation. Nasal mucosa are pink and moist without lesions or exudates. Mouth:  Oral mucosa and oropharynx without lesions or exudates.  Teeth in good repair. Neck:  No deformities, masses, or tenderness noted. Lungs:  Normal respiratory effort, chest expands symmetrically. Lungs are clear to auscultation, no crackles or wheezes.   Impression & Recommendations:  Problem # 1:  ACUTE SINUSITIS, UNSPECIFIED (ICD-461.9)  His updated medication list for this problem includes:    Augmentin 875-125 Mg Tabs (Amoxicillin-pot clavulanate) .Marland Kitchen..Marland Kitchen Two times a day  Problem # 2:  DENTAL PAIN (ICD-525.9)  Complete Medication List: 1)  Crestor 10 Mg Tabs (Rosuvastatin calcium) .... Once daily 2)  Diovan Hct 160-25 Mg Tabs (Valsartan-hydrochlorothiazide) .... Once daily 3)  Cymbalta 60 Mg Cpep (Duloxetine hcl) .... Once daily 4)  Benadryl 25 Mg Tabs (Diphenhydramine hcl) .Marland Kitchen.. 1 by mouth at bedtime 5)  Synthroid 150 Mcg Tabs (Levothyroxine sodium) .... Once daily 6)  Fish Oil Oil (Fish oil) .... Once daily 7)  Vitamin D 1000 Unit Tabs (Cholecalciferol) .... 2 once daily 8)  Calcium 1500 Mg  Tabs (Calcium carbonate) .... Once  daily 9)  Multivitamins Tabs (Multiple vitamin) .... Once daily 10)  Celebrex 200 Mg Caps (Celecoxib) .... Two times a day 11)  Androgel Pump 1 % Gel (Testosterone) .... Apply 10 grams once daily 12)  Augmentin 875-125 Mg Tabs (Amoxicillin-pot clavulanate) .... Two times a day  Patient Instructions: 1)  Will use Augmentin for the sinusitis because this will cover any dental infections as well. Motrin as needed . 2)  Please schedule a follow-up appointment as needed .  Prescriptions: AUGMENTIN 875-125 MG TABS (AMOXICILLIN-POT CLAVULANATE) two times a day  #20 x 0   Entered and Authorized by:   [name]   Signed by:   [name] on 11/18/2009   Method used:   Electronically to        [facility] [address] [phone]* (retail)       [address]       [place], [place]  [address]       Ph: [phone] or [phone]       Fax: [phone]   RxID:   [id number]

## 2010-07-18 NOTE — Progress Notes (Signed)
Summary: pain clinic  Phone Note Call from Patient   Caller: Patient Call For: [name] Reason for Call: Acute Illness Summary of Call: [facility] .....Marland KitchenMarland KitchenDamek [name] would like a referral to this [phone] pain clinic of his arthritis diagnosis.  Initial call taken by: Lynann Beaver CMA,  March 21, 2010 4:42 PM  Follow-up for Phone Call        okay please do so Follow-up by: [name],  March 22, 2010 12:59 PM  Additional Follow-up for Phone Call Additional follow up Details #1::        done sent referral to terri Additional Follow-up by: Pura Spice, RN,  March 22, 2010 1:32 PM

## 2010-07-18 NOTE — Progress Notes (Signed)
Summary: Pt says to call Testim,Diovan and Cymbalta into [facility] mail order  Phone Note Call from Patient Call back at Home Phone [phone] Call back at [phone] cell   Caller: Patient Summary of Call: Pt called and said to call Testim into [facility] mail order pharmacy, along will script for Diovan and Cymbalta.   Initial call taken by: Lucy Antigua,  December 05, 2009 1:29 PM    Prescriptions: TESTIM 1 % GEL (TESTOSTERONE) 10 grams once daily  #90 days x 3   Entered by:   Raechel Ache, RN   Authorized by:   [name]   Signed by:   Raechel Ache, RN on 12/05/2009   Method used:   Printed then faxed to ...       [facility] (mail-order)             , [place]         Ph: [phone]       Fax: [phone]   RxID:   [id number] CYMBALTA 60 MG  CPEP (DULOXETINE HCL) once daily  #90 x 3   Entered by:   Raechel Ache, RN   Authorized by:   [name]   Signed by:   Raechel Ache, RN on 12/05/2009   Method used:   Printed then faxed to ...       [facility] (mail-order)             , [place]         Ph: [phone]       Fax: [phone]   RxID:   [id number] DIOVAN HCT 160-25 MG  TABS (VALSARTAN-HYDROCHLOROTHIAZIDE) once daily  #90 x 3   Entered by:   Raechel Ache, RN   Authorized by:   [name]   Signed by:   Raechel Ache, RN on 12/05/2009   Method used:   Printed then faxed to ...       [facility] (mail-order)             , [place]         Ph: [phone]       Fax: [phone]   RxID:   [id number]

## 2010-07-25 ENCOUNTER — Other Ambulatory Visit: Payer: Self-pay

## 2010-07-25 DIAGNOSIS — E291 Testicular hypofunction: Secondary | ICD-10-CM

## 2010-07-25 MED ORDER — TESTOSTERONE 50 MG/5GM (1%) TD GEL: 10.0000 g | Freq: Every day | TRANSDERMAL | Status: DC

## 2010-07-25 NOTE — Telephone Encounter (Signed)
Okay for 6 months 

## 2010-07-25 NOTE — Telephone Encounter (Signed)
rx called in and pt aware 

## 2010-07-28 ENCOUNTER — Telehealth: Payer: Self-pay | Admitting: Family Medicine

## 2010-07-28 DIAGNOSIS — E291 Testicular hypofunction: Secondary | ICD-10-CM

## 2010-07-28 MED ORDER — TESTOSTERONE 50 MG/5GM (1%) TD GEL: 10.0000 g | Freq: Every day | TRANSDERMAL | Status: DC

## 2010-07-28 NOTE — Telephone Encounter (Signed)
[facility] called

## 2010-07-28 NOTE — Telephone Encounter (Signed)
Attempted to call in testim with 11 refills per order from Dr Clent Ridges  but could not due to controlled . [facility] notified and 5 refills given

## 2010-07-28 NOTE — Telephone Encounter (Signed)
Call in Testim gel 50 mg /5 gm, to apply 10 grams to skin daily, 30 day with 11 rf

## 2010-09-21 LAB — CBC
HCT: 42.2 % (ref 39.0–52.0)
MCHC: 34.3 g/dL (ref 30.0–36.0)
MCV: 91.1 fL (ref 78.0–100.0)
Platelets: 209 10*3/uL (ref 150–400)
RDW: 12.7 % (ref 11.5–15.5)

## 2010-09-21 LAB — GLUCOSE, CAPILLARY: Glucose-Capillary: 119 mg/dL — ABNORMAL HIGH (ref 70–99)

## 2010-09-21 LAB — COMPREHENSIVE METABOLIC PANEL
Albumin: 4 g/dL (ref 3.5–5.2)
BUN: 13 mg/dL (ref 6–23)
Calcium: 9.2 mg/dL (ref 8.4–10.5)
Chloride: 104 mEq/L (ref 96–112)
Creatinine, Ser: 0.94 mg/dL (ref 0.4–1.5)
Total Bilirubin: 0.5 mg/dL (ref 0.3–1.2)

## 2010-09-21 LAB — URINALYSIS, ROUTINE W REFLEX MICROSCOPIC
Bilirubin Urine: NEGATIVE
Ketones, ur: NEGATIVE mg/dL
Nitrite: NEGATIVE
Specific Gravity, Urine: 1.008 (ref 1.005–1.030)
Urobilinogen, UA: 0.2 mg/dL (ref 0.0–1.0)

## 2010-09-21 LAB — APTT: aPTT: 27 seconds (ref 24–37)

## 2010-09-21 LAB — PROTIME-INR: INR: 0.94 (ref 0.00–1.49)

## 2010-10-03 LAB — BASIC METABOLIC PANEL
BUN: 14 mg/dL (ref 6–23)
CO2: 29 mEq/L (ref 19–32)
Chloride: 103 mEq/L (ref 96–112)
Creatinine, Ser: 0.94 mg/dL (ref 0.4–1.5)

## 2010-10-03 LAB — CBC
MCHC: 35.5 g/dL (ref 30.0–36.0)
MCV: 89.9 fL (ref 78.0–100.0)
Platelets: 206 10*3/uL (ref 150–400)

## 2010-10-03 LAB — PROTIME-INR: Prothrombin Time: 13.1 seconds (ref 11.6–15.2)

## 2010-10-31 NOTE — Assessment & Plan Note (Signed)
[facility]                             PULMONARY OFFICE NOTE   NAME:Gilson, ELAM ELLIS                  MRN:          [id number]  DATE:02/03/2007                            DOB:          1956/02/23    SUBJECTIVE:  Mr. Vanvalkenburgh comes in today for followup after his recent  sleep study.  He was found to have 47 obstructive events per hour, and  O2 desaturation as low as 75%.  I have gone over the study in great  detail with Mr. Dhanani, and answered all of his questions, as well as  his wife's.   PHYSICAL EXAMINATION:  GENERAL:  He is an obese white male in no acute  distress.  Blood pressure is 138/80.  Pulse 88.  Temperature is 98.6.  Weight is  258 pounds.  His )2 saturation on room air is 96%.   IMPRESSION:  Severe obstructive sleep apnea by nocturnal  polysomnography.  I had a long discussion with him concerning the  importance of treating this secondary to cardiovascular health issues.  Really, given the severity of his sleep apnea, I think CPAP coupled with  weight loss are his best treatment options.  He is agreeable to this.   PLAN:  1. Initiate CPAP at 10 cm with followup at 4 weeks.  2. Work on weight loss.  3. The patient will follow up after the above.     Barbaraann Share, MD,FCCP  Electronically Signed    [facility]  DD: 02/03/2007  DT: 02/04/2007  Job #: [id number]   cc:   Jeannett Senior A. Clent Ridges, MD

## 2010-10-31 NOTE — Assessment & Plan Note (Signed)
[facility]                             PULMONARY OFFICE NOTE   NAME:MCCORMICKAlvan, Culpepper                  MRN:          [id number]  DATE:12/11/2006                            DOB:          1956/04/28    SLEEP MEDICINE CONSULTATION.   HISTORY OF PRESENT ILLNESS:  The patient is a 55 year old gentleman who  I have been asked to see for possible obstructive sleep apnea.  Patient  states that he does have loud snoring for many years and his wife has  noticed pauses in his breathing during sleep.  Typically, he gets to bed  between 10 and 11 and gets up at 6:30 to start his day.  He is not  rested upon arising.  The patient notes significant fatigue during the  day as well as significant daytime sleepiness with periods of  inactivity.  He finds it very difficult to get through a t.v. show or  movie without dozing.  He also notes some sleep pressure with long  distance driving.  Patient has quit smoking and has noted a 30 pound  weight gain the last 9 months.   PAST MEDICAL HISTORY:  Significant for:  1. Hypertension.  2. Dyslipidemia.  3. History of knee surgery.  4. History of recently diagnosed hypothyroidism for which he is      started on Synthroid.   CURRENT MEDICATIONS:  1. Cymbalta 60 mg daily.  2. Diovan HCTZ 160/25 one daily.  3. Synthroid 100 mcg daily.  4. Crestor 10 mg daily.  5. Aspirin 325 daily.   PATIENT HAS AN ALLERGY TO SULFA.   SOCIAL HISTORY:  1. Patient is married and has children.  2. 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  3. He has a history of smoking 1 pack per day for 25 years.  He has      not smoked in a year.   FAMILY HISTORY:  Remarkable for his father having heart disease;  otherwise, noncontributory.   REVIEW OF SYSTEMS:  As per History of Present Illness.  Also see Patient  Intake Form documented in the chart.   PHYSICAL EXAM:  GENERAL:  He is an overweight male in no acute  distress.  Blood pressure is 128/80, pulse 73, temperature is 98, weight is 258  pounds.  The O2 saturation on room air is 97%.  HEENT:  Pupils equal, round, reactive to light and accommodation,  extraocular muscles are intact.  Nares show turbinate hypertrophy with  narrowing.  Oropharynx shows mild to moderate elongation of the soft  palate and uvula.  NECK:  Supple without JVD or lymphadenopathy.  There is no palpable  thyromegaly.  CHEST:  Totally clear.  CARDIAC EXAM:  Reveals regular rate and rhythm with no murmurs, rubs or  gallops.  ABDOMEN:  Soft, nontender with good bowel sounds.  GENITAL EXAM, RECTAL EXAM, BREAST EXAM:  Was not done and not indicated.  LOWER EXTREMITIES:  Without edema, pulses are intact distally.  NEUROLOGICALLY:  Alert and oriented with no evidence of motor deficits.   IMPRESSION:  Probable obstructive sleep apnea.  The patient gives a  classic history for this and is overweight and has abnormal upper airway  anatomy with a large neck.  I have had a long discussion with him about  sleep apnea and how we make the diagnosis as well as some forms of  treatment.  At this point in time, I think he does need to have a  nocturnal polysomnogram.   PLAN:  1. Schedule for NPSG.  2. Work on weight loss.  3. The patient will follow up after the above.     Barbaraann Share, MD,FCCP  Electronically Signed    [facility]/MedQ  DD: 12/12/2006  DT: 12/12/2006  Job #: [id number]   cc:   Tera Mater. Clent Ridges, MD

## 2010-10-31 NOTE — Procedures (Signed)
NAME:  Howard [name], Howard [name] NO.:  [id number]   MEDICAL RECORD NO.:  [id number]          PATIENT TYPE:  OUT   LOCATION:  SLEEP CENTER                 FACILITY[facility]  [facility]   PHYSICIAN:  Barbaraann Share, MD,FCCPDATE OF BIRTH:  August 11, 1955   DATE OF STUDY:  01/13/2007                            NOCTURNAL POLYSOMNOGRAM   REFERRING PHYSICIAN:   INDICATION FOR STUDY:  Hypersomnia with sleep apnea.   EPWORTH SLEEPINESS SCORE:  15.   SLEEP ARCHITECTURE:  The patient had a total sleep time of 313 minutes  with no slow wave sleep and only 45 minutes of REM.  Sleep onset latency  was normal, and REM onset was very prolonged at 221 minutes.  Sleep  efficiency was decreased at 87%.   RESPIRATORY DATA:  The patient was found to have 297 obstructive  hypopneas and 1 obstructive apnea for an apnea/hypopnea index of 57  events per hour.  The events occurred in all body positions, and there  was moderate to severe snoring noted throughout.   OXYGEN DATA:  There was O2 desaturation as low as 75% during the  patient's REM events.   CARDIAC DATA:  Rare PVCs were noted with no clinically significant  arrhythmia.   MOVEMENT-PARASOMNIA:  None.   IMPRESSION/RECOMMENDATION:  1. Severe obstructive sleep apnea with an apnea/hypopnea index of 57      events per hour and O2 desaturation as low as 75%.  Treatment for      this degree of sleep apnea should focus primarily on weight loss as      well as continuous positive airway pressure (CPAP).  2. Rare premature ventricular contraction without clinically      significant arrhythmia is noted.      Barbaraann Share, MD,FCCP  Diplomate, American Board of Sleep  Medicine  Electronically Signed     [facility]/MEDQ  D:  01/29/2007 16:15:06  T:  01/30/2007 21:32:42  Job:  [id number]

## 2010-10-31 NOTE — Op Note (Signed)
NAME:  Howard [name], Howard [name] NO.:  [id number]   MEDICAL RECORD NO.:  [id number]          PATIENT TYPE:  AMB   LOCATION:  SDS                          FACILITY:  [facility]   PHYSICIAN:  Leonides Grills, M.D.     DATE OF BIRTH:  09/06/1955   DATE OF PROCEDURE:  08/02/2008  DATE OF DISCHARGE:                               OPERATIVE REPORT   PREOPERATIVE DIAGNOSES:  1. Left tight gastroc.  2. Left chronic plantar fasciitis.   POSTOPERATIVE DIAGNOSES:  1. Left tight gastroc.  2. Left chronic plantar fasciitis.   OPERATIONS:  1. Left gastroc slide.  2. Left Topaz application, plantar fascia.   ANESTHESIA:  General.   SURGEON:  Leonides Grills, MD   ASSISTANT:  Richardean Canal, PA-C   ESTIMATED BLOOD LOSS:  Minimal.   TOURNIQUET TIME:  None.   COMPLICATIONS:  None.   DISPOSITION:  Stable to PR.   INDICATIONS:  This is a 55 year old male who has had longstanding left  plantar fasciitis with a tight gastroc that is interfering with his life  despite conservative management.  He was consented with the above  procedure.  All risks which include infection or vessel injury,  persistent pain, worsening pain, prolonged recovery, possibility of  plantar fascial rupture again were all explained.  Questions were  encouraged and answered.   OPERATION:  The patient brought to the operating room, placed in a  supine position.  After adequate general anesthesia was administered as  well as Ancef 1 g IV piggyback, left lower extremity was prepped and  draped in sterile manner.  No tourniquet was used.  A longitudinal  incision over the medial aspect of gastrocnemius muscle tendinous  junction was then made.  Dissection was carried down through the skin.  Hemostasis was obtained.  Fascia was opened along the incision.  Conjoined region was then developed to the gastroc soleus.  Soft tissue  was elevated at posterior aspect gastrocnemius.  Sural nerve was  identified and protected  posteriorly.  Gastrocnemius was then released  with curved Mayo scissors.  This had an extra release tight gastroc.  The area was copiously irrigated with normal saline.  Subcu was closed  with 3-0 Vicryl.  Skin was closed with 4-0 Monocryl subcuticular stitch.  Steri-Strips were applied.  We then mapped out the intended treatment  area on the medial tubercle, left calcaneus, then with a 2 mm 0.062 K-  wire, holes were made to the plantar fascia.  These were done at about 8  mm apart and were done in a grin, then using a Topaz applicator, we  applied the Topaz through the holes to the plantar fascia in the grin  that was drawn out preoperatively as well as intraoperatively.  This was  applied at the level of plantar fascia and just deep to this.  After  this was done, the wound was copiously irrigated with normal saline.  Sterile dressing was applied.  Cam walker boot was applied.  Steri-  Strips were applied to the calf wounds.  The patient was stable to the  PR.      Renae Fickle  Lestine Box, M.D.  Electronically Signed     PB/MEDQ  D:  08/02/2008  T:  08/03/2008  Job:  [id number]

## 2010-10-31 NOTE — Assessment & Plan Note (Signed)
[facility] OFFICE NOTE   NAME:Howard [name], Howard [name]                  MRN:          [id number]  DATE:11/05/2006                            DOB:          12-Feb-1956    This is a 55 year old gentleman here to establish with our practice.  He  is for treatment of hypertension primarily, but we also ended discussing  a number of issues.  He had previously seen Dr. Kevan Ny for primary care,  but is now transferring to [place].  He says he had a history of borderline  blood pressures over the past couple of years, but in the past 2 months,  his pressures are rising much higher than they have ever been.  He  attributes this primarily to putting on 30 pounds of weight over the  past year as a result of stopping smoking.  He also has a lot of joint  pains, including his low back and both knees, which prevent him from  doing much in the way of exercise.  He also admits to not eating a very  smart diet.  In any event, he is having his blood pressure checked at  work, and often gets systolic values from 170 up to 180, and diastolic  values up to about 110 or so lately.  He denies any shortness of breath  or chest pains, although he has very limited exercise tolerance since he  is so out of shape.   REVIEW OF SYSTEMS:  Otherwise, is positive degenerative arthritis in  L4/5 and L5/S1 of his spine.  He also has chronic knee pain, especially  on the right side, as noted below.  He usually takes nothing for it.  To  help him stop smoking last summer, he took Wellbutrin for a while, but  his caused problems with sleep.  Then, he developed a bit of a problem  with depression, partly due to some medical problems in his family.  He  was placed on Cymbalta in September 2007, and has remained on it since.  He still feels sluggish, anhedonia, and mildly depressed at times.  He  does sleep better, however, and may sometimes take Benadryl at night  to  help him sleep.  He has a long history of acid reflux.  He was on  Aciphex for a while, and then switched to over-the-counter agents.  He  now seems to be doing fairly well with these, and watching his diet.   PAST MEDICAL HISTORY:  Includes:  1. Acid reflux.  2. Degenerative arthritis in the knees and spine.  3. Elevated blood pressure as above.  4. Some mild depression.  5. He has had arthroscopy to both of his knees, and has had surgery to      the right knee twice, the last one being in 1984.  6. Dr. Earlene Plater did a lancing-type outpatient procedure for a perianal      abscess a few years ago.  7. Patient had a flexible sigmoidoscopy in 2005, which was normal      except for some hemorrhoids.  He has never had  a colonoscopy.  8. He had a treadmill stress test in December 2006, which was normal.   ALLERGIES:  SULFA CAUSES A RASH.   CURRENT MEDICATIONS:  1. Cymbalta 60 mg per day.  2. Famotidine over the counter as needed for heartburn.   HABITS:  After smoking cigarettes for 25 years, he quit smoking last  summer.  He does not use any other form of tobacco.  He drinks alcohol  occasionally.   SOCIAL HISTORY:  He is married.  He has 2 grown sons.  He is a  Designer, jewellery and a paramedic by training.  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   FAMILY HISTORY:  Remarkable for a lot of chronic illnesses in his  father, including high cholesterol, diabetes, heart disease,  hypertension, and renal disease.  His father, age 37, is actually  getting ready to start dialysis.  His father has also had some skin  cancers removed.  His mother died at the age of 47 of a combination of  multi-infarct, dementia, and Alzheimer's dementia.   IMMUNIZATIONS:  He had a tetanus booster in September 2007.   OBJECTIVE:  Height 6 feet 1 inch.  Weight 261.  BP 152/120.  Pulse 88  and regular.  GENERAL:  He is fairly anxious.  He  is overweight.  NECK:  Supple without lymphadenopathy or thyromegaly.  LUNGS:  Clear.  CARDIAC:  Rate and rhythm are regular without gallops, murmurs, rubs.  Distal pulses are full.  EXTREMITIES:  Show no edema.  Affect is mildly depressed, but he has good eye contact, and is quite  appropriate.   ASSESSMENT AND PLAN:  1. Hypertension.  I gave him samples to begin Diovan      hydrochlorothiazide 160/25 to take once a day.  Obviously,      increasing exercise and losing weight will be important longterm.      We will check his blood pressure again in 3 weeks when he comes      back for a complete physical.  At that time, we will also get      screening lab work, and EKG, etc.  2. Probable sleep apnea.  Patient reports his wife tells him he snores      a lot, and stops breathing a lot during the night.  We will set him      up for evaluation with Dr. Shelle Iron some time soon.  3. Depression.  Apparently stable.  4. Degenerative joint disease.  Stable.  5. Gastroesophageal reflux disease.  Stable.  6. Health maintenance.  We will have him return in 3 weeks with lab      work to do a complete physical exam.  We also await copies of his      records from his previous physician's office.     Howard [name]. Clent Ridges, MD  Electronically Signed    SAF/MedQ  DD: 11/05/2006  DT: 11/05/2006  Job #: [id number]

## 2010-11-03 NOTE — Assessment & Plan Note (Signed)
[facility] OFFICE NOTE   NAME:[name], [name]                  MRN:          [id number]  DATE:11/05/2006                            DOB:          06-07-1956    This is a 55 year old gentleman here for a complete physical  examination. I had an introductory visit with him on May 20. I refer to  this note concerning details of his past medical history, family  history, social history, habits, etc. At that point, we noted that he  had high blood pressure and started him on samples of Diovan HCT. He has  tolerated these quite well and says that this blood pressure has  responded nicely. We felt that he probably had sleep apnea and I  referred him to see Dr. Shelle Iron in our pulmonary department for further  evaluation. He is due to see him on June 25.   ALLERGIES:  SULFA causes a rash.   CURRENT MEDICATIONS:  1. Cymbalta 60 mg per day.  2. Diovan HCT 160/25 once a day.   OBJECTIVE:  VITAL SIGNS:  Height 6 foot 1 inch, weight 258, blood  pressure 112/82, pulse 92 and regular.  GENERAL:  He is overweight.  SKIN:  Clear.  HEENT:  Eyes clear. He wears glasses. Ears are clear. Pharynx is clear.  NECK:  Supple without lymphadenopathy or masses.  LUNGS:  Clear.  CARDIAC:  Rate and rhythm regular without gallops, murmurs or rubs.  Distal pulses are full. EKG is within normal limits.  ABDOMEN:  Soft, normal bowel sounds, nontender, no masses.  GENITALIA:  Normal male. He is circumcised.  RECTAL:  No masses or tenderness. Prostate is within normal limits.  Stool Hemoccult negative.  EXTREMITIES:  No clubbing, cyanosis or edema.  NEUROLOGIC:  Grossly intact.   The patient was here for fasting labs on June 3. This was remarkable for  3 distinct abnormalities. First off his lipid panel was by his own  admission the worst he has ever had. Total cholesterol was high at 290,  triglycerides high at 162, HDL was adequate  at 40 but LDL was up to 181.  His fasting glucose was elevated to 111 with hemoglobin A1c of 6.8. Also  his TSH was extremely elevated to greater than 100, otherwise his labs  were unremarkable.   ASSESSMENT/PLAN:  1. Complete physical. We talked about increasing exercise and losing      weight.  2. Hypertension well controlled. I refilled his medication as above.      Will also begin aspirin 81 mg once daily for maintenance.  3. New onset type 2 diabetes mellitus. I feel confident that diet and      exercise can control this adequately as does the patient. He seems      motivated to take his health more seriously now and begin an      exercise and diet program in earnest. I asked to see him back in 3      months for another A1c.  4. Hyperlipidemia. Will begin Crestor 10 mg once daily. Will check  this again in 3 months.  5. New diagnosis of hypothyroidism. Will begin Synthroid 100 mcg per      day. He will take this for the next 3 months and will recheck his      levels again in 3 months.  6. Sleep apnea. He will followup with Dr. Shelle Iron as noted above.  7. Depression, stable.     Tera Mater. Clent Ridges, MD  Electronically Signed    SAF/MedQ  DD: 11/26/2006  DT: 11/27/2006  Job #: [id number]

## 2010-11-11 ENCOUNTER — Other Ambulatory Visit: Payer: Self-pay | Admitting: Family Medicine

## 2011-02-09 ENCOUNTER — Other Ambulatory Visit: Payer: Self-pay | Admitting: Family Medicine

## 2011-02-12 NOTE — Telephone Encounter (Signed)
Scribe sent E-scribe

## 2011-04-20 ENCOUNTER — Other Ambulatory Visit: Payer: Self-pay | Admitting: Family Medicine

## 2011-04-20 NOTE — Telephone Encounter (Signed)
Time for an office visit 

## 2011-07-19 ENCOUNTER — Telehealth: Payer: Self-pay | Admitting: Family Medicine

## 2011-07-19 NOTE — Telephone Encounter (Signed)
I called in script to [facility] and left voice message for pt to schedule a office visit.

## 2011-07-19 NOTE — Telephone Encounter (Signed)
Call in 30 day supply only. He needs an OV after that

## 2011-11-25 ENCOUNTER — Other Ambulatory Visit: Payer: Self-pay | Admitting: Orthopedic Surgery

## 2011-11-25 NOTE — Progress Notes (Signed)
Preoperative surgical orders have been place into the [facility] hospital system for Howard [name] on 11/25/2011, 10:00 PM  by Patrica Duel for surgery on 12/12/2011.  Preop Knee orders including IV Tylenol, and IV Decadron as long as there are no contraindications to the above medications.

## 2011-12-05 ENCOUNTER — Encounter (HOSPITAL_COMMUNITY): Payer: Self-pay | Admitting: Pharmacy Technician

## 2011-12-06 ENCOUNTER — Encounter (HOSPITAL_COMMUNITY): Payer: Self-pay

## 2011-12-06 ENCOUNTER — Ambulatory Visit (HOSPITAL_COMMUNITY)
Admission: RE | Admit: 2011-12-06 | Discharge: 2011-12-06 | Disposition: A | Discharge status: Home / Self Care | Payer: 59 | Source: Ambulatory Visit | Attending: Orthopedic Surgery | Admitting: Orthopedic Surgery

## 2011-12-06 ENCOUNTER — Encounter (HOSPITAL_COMMUNITY)
Admission: RE | Admit: 2011-12-06 | Discharge: 2011-12-06 | Disposition: A | Discharge status: Home / Self Care | Payer: 59 | Source: Ambulatory Visit | Attending: Orthopedic Surgery | Admitting: Orthopedic Surgery

## 2011-12-06 DIAGNOSIS — Z01812 Encounter for preprocedural laboratory examination: Secondary | ICD-10-CM | POA: Insufficient documentation

## 2011-12-06 DIAGNOSIS — Z01818 Encounter for other preprocedural examination: Secondary | ICD-10-CM | POA: Insufficient documentation

## 2011-12-06 DIAGNOSIS — X58XXXA Exposure to other specified factors, initial encounter: Secondary | ICD-10-CM | POA: Insufficient documentation

## 2011-12-06 DIAGNOSIS — Z87891 Personal history of nicotine dependence: Secondary | ICD-10-CM | POA: Insufficient documentation

## 2011-12-06 DIAGNOSIS — Z01810 Encounter for preprocedural cardiovascular examination: Secondary | ICD-10-CM | POA: Insufficient documentation

## 2011-12-06 DIAGNOSIS — IMO0002 Reserved for concepts with insufficient information to code with codable children: Secondary | ICD-10-CM | POA: Insufficient documentation

## 2011-12-06 DIAGNOSIS — I10 Essential (primary) hypertension: Secondary | ICD-10-CM | POA: Insufficient documentation

## 2011-12-06 HISTORY — DX: Hypothyroidism, unspecified: E03.9

## 2011-12-06 HISTORY — DX: Unspecified osteoarthritis, unspecified site: M19.90

## 2011-12-06 HISTORY — DX: Essential (primary) hypertension: I10

## 2011-12-06 HISTORY — DX: Hyperuricemia without signs of inflammatory arthritis and tophaceous disease: E79.0

## 2011-12-06 HISTORY — DX: Sleep apnea, unspecified: G47.30

## 2011-12-06 HISTORY — DX: Testicular hypofunction: E29.1

## 2011-12-06 HISTORY — DX: Malignant (primary) neoplasm, unspecified: C80.1

## 2011-12-06 HISTORY — DX: Dyskinesia of esophagus: K22.4

## 2011-12-06 HISTORY — DX: Pure hypercholesterolemia, unspecified: E78.00

## 2011-12-06 LAB — SURGICAL PCR SCREEN: Staphylococcus aureus: NEGATIVE

## 2011-12-06 LAB — CBC
HCT: 39.4 % (ref 39.0–52.0)
Hemoglobin: 13 g/dL (ref 13.0–17.0)
MCHC: 33 g/dL (ref 30.0–36.0)
MCV: 88.7 fL (ref 78.0–100.0)
WBC: 5.1 10*3/uL (ref 4.0–10.5)

## 2011-12-06 LAB — BASIC METABOLIC PANEL
CO2: 28 mEq/L (ref 19–32)
Chloride: 101 mEq/L (ref 96–112)
Glucose, Bld: 96 mg/dL (ref 70–99)
Sodium: 138 mEq/L (ref 135–145)

## 2011-12-06 MED ORDER — CHLORHEXIDINE GLUCONATE 4 % EX LIQD
60.0000 mL | Freq: Once | CUTANEOUS | Status: DC
  Filled 2011-12-06: qty 60

## 2011-12-06 NOTE — Patient Instructions (Addendum)
20 GIANFRANCO ARAKI  12/06/2011   Your procedure is scheduled on:  12/12/11  AT 2:00 PM  Report to SHORT STAY DEPT  at 11:30 AM AM.  Call this number if you have problems the morning of surgery: [phone]   Remember:   Do not eat food or drink liquids AFTER MIDNIGHT  May have clear liquids UNTIL 6 HOURS BEFORE SURGERY ( 8:00 AM)  Clear liquids include soda, tea, black coffee, apple or grape juice, broth.  Take these medicines the morning of surgery with A SIP OF WATER: CRESTOR / CYMBALTA / ALLOPURINOL / LEVOTHYROXINE / RANITIDINE   Do not wear jewelry, make-up or nail polish.  Do not wear lotions, powders, or perfumes.   Do not shave legs or underarms 48 hrs. before surgery (men may shave face)  Do not bring valuables to the hospital.  Contacts, dentures or bridgework may not be worn into surgery.  Leave suitcase in the car. After surgery it may be brought to your room.  For patients admitted to the hospital, checkout time is 11:00 AM the day of discharge.   Patients discharged the day of surgery will not be allowed to drive home.    Special Instructions:   Please read over the following fact sheets that you were given: MRSA  Information / Incentive Spirometer               SHOWER WITH BETASEPT THE NIGHT BEFORE SURGERY AND THE MORNING OF SURGERY

## 2011-12-12 ENCOUNTER — Ambulatory Visit (HOSPITAL_COMMUNITY)
Admission: RE | Admit: 2011-12-12 | Discharge: 2011-12-12 | Disposition: A | Discharge status: Home / Self Care | Payer: 59 | Source: Ambulatory Visit | Attending: Orthopedic Surgery | Admitting: Orthopedic Surgery

## 2011-12-12 ENCOUNTER — Encounter (HOSPITAL_COMMUNITY): Payer: Self-pay | Admitting: Anesthesiology

## 2011-12-12 ENCOUNTER — Encounter (HOSPITAL_COMMUNITY)
Admission: RE | Disposition: A | Discharge status: Home / Self Care | Payer: Self-pay | Source: Ambulatory Visit | Attending: Orthopedic Surgery

## 2011-12-12 ENCOUNTER — Ambulatory Visit (HOSPITAL_COMMUNITY): Payer: 59 | Admitting: Anesthesiology

## 2011-12-12 ENCOUNTER — Encounter (HOSPITAL_COMMUNITY): Payer: Self-pay | Admitting: *Deleted

## 2011-12-12 DIAGNOSIS — X58XXXA Exposure to other specified factors, initial encounter: Secondary | ICD-10-CM | POA: Insufficient documentation

## 2011-12-12 DIAGNOSIS — K219 Gastro-esophageal reflux disease without esophagitis: Secondary | ICD-10-CM | POA: Insufficient documentation

## 2011-12-12 DIAGNOSIS — I10 Essential (primary) hypertension: Secondary | ICD-10-CM | POA: Insufficient documentation

## 2011-12-12 DIAGNOSIS — E039 Hypothyroidism, unspecified: Secondary | ICD-10-CM | POA: Insufficient documentation

## 2011-12-12 DIAGNOSIS — S83249A Other tear of medial meniscus, current injury, unspecified knee, initial encounter: Secondary | ICD-10-CM

## 2011-12-12 DIAGNOSIS — Z79899 Other long term (current) drug therapy: Secondary | ICD-10-CM | POA: Insufficient documentation

## 2011-12-12 DIAGNOSIS — E7800 Pure hypercholesterolemia, unspecified: Secondary | ICD-10-CM | POA: Insufficient documentation

## 2011-12-12 DIAGNOSIS — IMO0002 Reserved for concepts with insufficient information to code with codable children: Secondary | ICD-10-CM | POA: Insufficient documentation

## 2011-12-12 DIAGNOSIS — G4730 Sleep apnea, unspecified: Secondary | ICD-10-CM | POA: Insufficient documentation

## 2011-12-12 HISTORY — PX: KNEE ARTHROSCOPY: SHX127

## 2011-12-12 LAB — GLUCOSE, CAPILLARY: Glucose-Capillary: 99 mg/dL (ref 70–99)

## 2011-12-12 SURGERY — ARTHROSCOPY, KNEE
Anesthesia: General | Site: Knee | Laterality: Left | Wound class: Clean

## 2011-12-12 MED ORDER — LACTATED RINGERS IR SOLN
Status: DC | PRN
  Administered 2011-12-12: 3000 mL

## 2011-12-12 MED ORDER — BUPIVACAINE-EPINEPHRINE PF 0.25-1:200000 % IJ SOLN
INTRAMUSCULAR | Status: AC
  Filled 2011-12-12: qty 30

## 2011-12-12 MED ORDER — FENTANYL CITRATE 0.05 MG/ML IJ SOLN
INTRAMUSCULAR | Status: DC | PRN
  Administered 2011-12-12 (×2): 100 ug via INTRAVENOUS

## 2011-12-12 MED ORDER — LACTATED RINGERS IV SOLN: INTRAVENOUS | Status: DC

## 2011-12-12 MED ORDER — BUPIVACAINE-EPINEPHRINE 0.25% -1:200000 IJ SOLN
INTRAMUSCULAR | Status: DC | PRN
  Administered 2011-12-12 (×2): 20 mL

## 2011-12-12 MED ORDER — OXYCODONE-ACETAMINOPHEN 5-325 MG PO TABS: 1.0000 | ORAL_TABLET | ORAL | Status: AC | PRN

## 2011-12-12 MED ORDER — ACETAMINOPHEN 10 MG/ML IV SOLN
INTRAVENOUS | Status: AC
  Filled 2011-12-12: qty 100

## 2011-12-12 MED ORDER — ACETAMINOPHEN 10 MG/ML IV SOLN
1000.0000 mg | Freq: Once | INTRAVENOUS | Status: AC
  Administered 2011-12-12: 1000 mg via INTRAVENOUS

## 2011-12-12 MED ORDER — CEFAZOLIN SODIUM-DEXTROSE 2-3 GM-% IV SOLR
2.0000 g | INTRAVENOUS | Status: AC
  Administered 2011-12-12: 2 g via INTRAVENOUS

## 2011-12-12 MED ORDER — MIDAZOLAM HCL 5 MG/5ML IJ SOLN
INTRAMUSCULAR | Status: DC | PRN
  Administered 2011-12-12: 2 mg via INTRAVENOUS

## 2011-12-12 MED ORDER — PROPOFOL 10 MG/ML IV BOLUS
INTRAVENOUS | Status: DC | PRN
  Administered 2011-12-12: 200 mg via INTRAVENOUS

## 2011-12-12 MED ORDER — MEPERIDINE HCL 50 MG/ML IJ SOLN: 6.2500 mg | INTRAMUSCULAR | Status: DC | PRN

## 2011-12-12 MED ORDER — LACTATED RINGERS IV SOLN
INTRAVENOUS | Status: DC
  Administered 2011-12-12: 1000 mL via INTRAVENOUS

## 2011-12-12 MED ORDER — CEFAZOLIN SODIUM-DEXTROSE 2-3 GM-% IV SOLR
INTRAVENOUS | Status: AC
  Filled 2011-12-12: qty 50

## 2011-12-12 MED ORDER — METHOCARBAMOL 500 MG PO TABS: 500.0000 mg | ORAL_TABLET | Freq: Four times a day (QID) | ORAL | Status: AC

## 2011-12-12 MED ORDER — PROMETHAZINE HCL 25 MG/ML IJ SOLN: 6.2500 mg | INTRAMUSCULAR | Status: DC | PRN

## 2011-12-12 MED ORDER — SODIUM CHLORIDE 0.9 % IV SOLN: INTRAVENOUS | Status: DC

## 2011-12-12 MED ORDER — FENTANYL CITRATE 0.05 MG/ML IJ SOLN: 25.0000 ug | INTRAMUSCULAR | Status: DC | PRN

## 2011-12-12 MED ORDER — DEXAMETHASONE SODIUM PHOSPHATE 10 MG/ML IJ SOLN: 10.0000 mg | Freq: Once | INTRAMUSCULAR | Status: DC

## 2011-12-12 SURGICAL SUPPLY — 29 items
BANDAGE ELASTIC 6 VELCRO ST LF (GAUZE/BANDAGES/DRESSINGS) ×1 IMPLANT
BLADE 4.2CUDA (BLADE) ×2 IMPLANT
CLOTH BEACON ORANGE TIMEOUT ST (SAFETY) ×2 IMPLANT
CUFF TOURN SGL QUICK 34 (TOURNIQUET CUFF) ×2
CUFF TRNQT CYL 34X4X40X1 (TOURNIQUET CUFF) ×1 IMPLANT
DRAPE U-SHAPE 47X51 STRL (DRAPES) ×2 IMPLANT
DRSG ADAPTIC 3X8 NADH LF (GAUZE/BANDAGES/DRESSINGS) ×1 IMPLANT
DRSG EMULSION OIL 3X3 NADH (GAUZE/BANDAGES/DRESSINGS) ×2 IMPLANT
DRSG PAD ABDOMINAL 8X10 ST (GAUZE/BANDAGES/DRESSINGS) ×2 IMPLANT
DURAPREP 26ML APPLICATOR (WOUND CARE) ×2 IMPLANT
GLOVE BIO SURGEON STRL SZ7.5 (GLOVE) ×2 IMPLANT
GLOVE BIO SURGEON STRL SZ8 (GLOVE) ×2 IMPLANT
GLOVE BIOGEL PI IND STRL 8 (GLOVE) ×1 IMPLANT
GLOVE BIOGEL PI INDICATOR 8 (GLOVE) ×1
GOWN STRL NON-REIN LRG LVL3 (GOWN DISPOSABLE) ×2 IMPLANT
IV LACTATED RINGER IRRG 3000ML (IV SOLUTION) ×6
IV LR IRRIG 3000ML ARTHROMATIC (IV SOLUTION) IMPLANT
MANIFOLD NEPTUNE II (INSTRUMENTS) ×4 IMPLANT
PACK ARTHROSCOPY WL (CUSTOM PROCEDURE TRAY) ×2 IMPLANT
PACK ICE MAXI GEL EZY WRAP (MISCELLANEOUS) ×6 IMPLANT
PADDING CAST COTTON 6X4 STRL (CAST SUPPLIES) ×2 IMPLANT
POSITIONER SURGICAL ARM (MISCELLANEOUS) ×2 IMPLANT
SET ARTHROSCOPY TUBING (MISCELLANEOUS) ×2
SET ARTHROSCOPY TUBING LN (MISCELLANEOUS) ×1 IMPLANT
SPONGE GAUZE 4X4 12PLY (GAUZE/BANDAGES/DRESSINGS) ×1 IMPLANT
SUT ETHILON 4 0 PS 2 18 (SUTURE) ×2 IMPLANT
TOWEL OR 17X26 10 PK STRL BLUE (TOWEL DISPOSABLE) ×2 IMPLANT
WAND 90 DEG TURBOVAC W/CORD (SURGICAL WAND) ×2 IMPLANT
WRAP KNEE MAXI GEL POST OP (GAUZE/BANDAGES/DRESSINGS) ×4 IMPLANT

## 2011-12-12 NOTE — H&P (Signed)
CC- ADAIR LAUDERBACK is a 56 y.o. male who presents with left knee pain.  HPI- . Knee Pain: Patient presents with knee pain involving the  left knee. Onset of the symptoms was several months ago. Inciting event: none known. Current symptoms include giving out, pain located medially, popping sensation and stiffness. Pain is aggravated by going up and down stairs, pivoting, rising after sitting and squatting.  Patient has had no prior knee problems. Evaluation to date: MRI: abnormal medial meniscal tear. Treatment to date: corticosteroid injection which was not very effective and rest.  Past Medical History  Diagnosis Date  . Hypertension   . Elevated cholesterol   . Arthritis   . Elevated uric acid in blood   . Esophageal spasm   . Reflux   . Hypothyroidism   . Sleep apnea     uses c-pap  . Cancer     skin cancer removed  . Hypogonadism male     Past Surgical History  Procedure Date  . Knee surgery 1973 / 1983 / 1996    rt and left   . Eye surgery 2010  . Shoulder surgery     rt  . Leg surgery 2011    left  . Carpal tunnel release 2012 /2013    bil    Prior to Admission medications   Medication Sig Start Date End Date Taking? Authorizing Provider  allopurinol (ZYLOPRIM) 300 MG tablet Take 300 mg by mouth every morning.    Historical Provider, MD  aspirin 81 MG tablet Take 81 mg by mouth every morning.    Historical Provider, MD  buprenorphine (BUTRANS) 10 MCG/HR PTWK Place 10 mcg onto the skin once a week.    Historical Provider, MD  celecoxib (CELEBREX) 200 MG capsule Take 200 mg by mouth daily.    Historical Provider, MD  cholecalciferol (VITAMIN D) 1000 UNITS tablet Take 1,000 Units by mouth daily.    Historical Provider, MD  Coenzyme Q10 (CO Q 10 PO) Take 1 tablet by mouth daily.    Historical Provider, MD  CRESTOR 10 MG tablet TAKE 1 TABLET ONCE DAILY 02/09/11   Nelwyn Salisbury, MD  CYMBALTA 60 MG capsule TAKE 1 CAPSULE DAILY 07/19/11   Nelwyn Salisbury, MD  diclofenac  sodium (VOLTAREN) 1 % GEL Apply 1 application topically as needed. pain    Historical Provider, MD  fish oil-omega-3 fatty acids 1000 MG capsule Take 2 g by mouth daily.    Historical Provider, MD  glucosamine-chondroitin 500-400 MG tablet Take 2 tablets by mouth daily.    Historical Provider, MD  levothyroxine (SYNTHROID, LEVOTHROID) 175 MCG tablet Take 175 mcg by mouth daily.    Historical Provider, MD  Pyridoxine HCl (VITAMIN B-6) 250 MG tablet Take 250 mg by mouth daily.    Historical Provider, MD  ranitidine (ZANTAC) 150 MG tablet Take 150 mg by mouth 1 day or 1 dose.    Historical Provider, MD  Saw Palmetto, Serenoa repens, (SAW PALMETTO PO) Take 1 capsule by mouth daily.    Historical Provider, MD  testosterone (ANDROGEL) 50 MG/5GM GEL Place 15 g onto the skin daily.    Historical Provider, MD  valsartan-hydrochlorothiazide (DIOVAN-HCT) 320-25 MG per tablet Take 1 tablet by mouth daily.    Historical Provider, MD   KNEE EXAM soft tissue tenderness over medial joint line, negative drawer sign, collateral ligaments intact, normal ipsilateral hip exam  Physical Examination: General appearance - alert, well appearing, and in no distress Mental status -  alert, oriented to person, place, and time Chest - clear to auscultation, no wheezes, rales or rhonchi, symmetric air entry Heart - normal rate, regular rhythm, normal S1, S2, no murmurs, rubs, clicks or gallops Abdomen - soft, nontender, nondistended, no masses or organomegaly Neurological - alert, oriented, normal speech, no focal findings or movement disorder noted   Asessment/Plan--- Left knee medial meniscal tear- - Plan left knee arthroscopy with meniscal debridement. Procedure risks and potential comps discussed with patient who elects to proceed. Goals are decreased pain and increased function with a high likelihood of achieving both

## 2011-12-12 NOTE — Progress Notes (Signed)
Ice bags sent home with pt. Teach back method done

## 2011-12-12 NOTE — Op Note (Signed)
Preoperative diagnosis-  Left knee medial meniscal tear  Postoperative diagnosis Left- knee medial meniscal tear   Plus Left medial femoral chondral defect  Procedure- Left knee arthroscopy with medial Meniscal debridement and chondroplasty   Surgeon- Gus Rankin. Tiwatope Emmitt, MD  Anesthesia-General  EBL-  minimal Complications- None  Condition- PACU - hemodynamically stable.  Brief clinical note- -Howard [name] is a 56 y.o.  male with a several month history of left knee pain and mechanical symptoms. Exam and history suggested medial meniscal tear confirmed by MRI. The patient presents now for arthroscopy and debridement   Procedure in detail -       After successful administration of General anesthetic, a tourmiquet is placed high on the Left  thigh and the Left lower extremity is prepped and draped in the usual sterile fashion. Time out is performed by the surgical team. Standard superomedial and inferolateral portal sites are marked and incisions made with an 11 blade. The inflow cannula is passed through the superomedial portal and camera through the inferolateral portal and inflow is initiated. Arthroscopic visualization proceeds.      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 The medial and lateral gutters are visualized and there are no loose bodies. Flexion and valgus force is applied to the knee and the medial compartment is entered. A spinal needle is passed into the joint through the site marked for the inferomedial portal. A small incision is made and the dilator passed into the joint. The findings for the medial compartment are posterior horn and body unstable medial meniscal tear and 1 x 2 cm area chondral defect of medial femoral condyle. The tear is debrided to a stable base with baskets and a shaver and sealed off with the Arthrocare. The shaver is used to debride the unstable cartilage to a  stable cartilaginous base with stable edges. It is probed and found to be stable.    The intercondylar notch is visualized and the ACL appears normal. The lateral compartment is entered and the findings are normal .      The joint is again inspected and there are no other tears, defects or loose bodies identified. The unstable cartilage in the trochlea is debrided back to a stable cartilaginous base.The arthroscopic equipment is then removed from the inferior portals which are closed with interrupted 4-0 nylon. 20 ml of .25% Marcaine with epinephrine are injected through the inflow cannula and the cannula is then removed and the portal closed with nylon. The incisions are cleaned and dried and a bulky sterile dressing is applied. The patient is then awakened and transported to recovery in stable condition.   12/12/2011, 1:43 PM

## 2011-12-12 NOTE — Transfer of Care (Signed)
Immediate Anesthesia Transfer of Care Note  Patient: Howard [name]  Procedure(s) Performed: Procedure(s) (LRB): ARTHROSCOPY KNEE (Left)  Patient Location: PACU  Anesthesia Type: General  Level of Consciousness: awake, alert , oriented and patient cooperative  Airway & Oxygen Therapy: Patient Spontanous Breathing and Patient connected to face mask oxygen  Post-op Assessment: Report given to PACU RN and Post -op Vital signs reviewed and stable  Post vital signs: Reviewed and stable  Complications: No apparent anesthesia complications

## 2011-12-12 NOTE — Discharge Instructions (Signed)
Arthroscopic Procedure, Knee An arthroscopic procedure can find what is wrong with your knee. PROCEDURE Arthroscopy is a surgical technique that allows your orthopedic surgeon to diagnose and treat your knee injury with accuracy. They will look into your knee through a small instrument. This is almost like a small (pencil sized) telescope. Because arthroscopy affects your knee less than open knee surgery, you can anticipate a more rapid recovery. Taking an active role by following your caregiver's instructions will help with rapid and complete recovery. Use crutches, rest, elevation, ice, and knee exercises as instructed. The length of recovery depends on various factors including type of injury, age, physical condition, medical conditions, and your rehabilitation. Your knee is the joint between the large bones (femur and tibia) in your leg. Cartilage covers these bone ends which are smooth and slippery and allow your knee to bend and move smoothly. Two menisci, thick, semi-lunar shaped pads of cartilage which form a rim inside the joint, help absorb shock and stabilize your knee. Ligaments bind the bones together and support your knee joint. Muscles move the joint, help support your knee, and take stress off the joint itself. Because of this all programs and physical therapy to rehabilitate an injured or repaired knee require rebuilding and strengthening your muscles. AFTER THE PROCEDURE  After the procedure, you will be moved to a recovery area until most of the effects of the medication have worn off. Your caregiver will discuss the test results with you.   Only take over-the-counter or prescription medicines for pain, discomfort, or fever as directed by your caregiver.  SEEK MEDICAL CARE IF:   You have increased bleeding from your wounds.   You see redness, swelling, or have increasing pain in your wounds.   You have pus coming from your wound.   You have an oral temperature above 102 F (38.9  C).   You notice a bad smell coming from the wound or dressing.   You have severe pain with any motion of your knee.  SEEK IMMEDIATE MEDICAL CARE IF:   You develop a rash.   You have difficulty breathing.   You have any allergic problems.  Document Released: 06/01/2000 Document Revised: 05/24/2011 Document Reviewed: 12/24/2007 [facility] Patient Information 2012 [place], [place].Chondral Injury Localized injuries to the surface of joints are known as chondral injuries. In chondral injuries, the articular cartiledge sustains and injury. This may or may not involve the separation of the articular cartiledge from the bone. Chondral injuries do not involve an injury to the underlying bone. These injuries can occur in any joint; although, the knee joint is the most commonly injured, followed by the ankle, elbow, and shoulder. Chondral injuries occur most frequently in adolescent males. Chondral injuries are difficult to treat because cartilage has a limited ability to heal.  SYMPTOMS   Pain, swelling, or pain or tenderness in the affected joint.   Giving way, locking, or catching of the joint.   Feeling of a free-floating piece of cartilage in the joint.   A crackling sound (crepitation) within the joint with motion.   Often, injuries to other structures within the joint (ligament tears or meniscus injury).  CAUSES  Direct trauma (impaction, avulsion, or shearing or rotational forces) to the joint.  RISK INCREASES WITH:  Participation in contact sports or sports in which playing on and falling on hard surfaces may occur.   Adolescence.   Other knee injury (anterior cruciate ligament (ACL) or meniscus tear).   Poor physical strength and flexibility  of the joint.  PREVENTION  Wear protective equipment (knee, elbow, or shoulder pads) to soften direct trauma.   Wear appropriate-length footwear (cleats for field sorts) for playing surface.   Warm up and stretch properly before physical  activity.   Maintain appropriate physical fitness:   Flexibility, strength, and endurance of muscles around joints.   Cardiovascular fitness  PROGNOSIS  The prognosis of chondral injuries is dependent on the size of the lesion (injury). Small chondral lesions may not cause problems. Large and/or deep chondral lesions are more problematic, because cartilage does not have the ability to heal. Chondral injuries may go on to develop arthritis of the joint. Usually the symptoms resolve with appropriate treatment, which can include removal of or fixing loose pieces of cartilage.  RELATED COMPLICATIONS   Frequent recurrence of symptoms, resulting in chronic pain and swelling.   Arthritis of the affected joint.   Loose bodies that cause locking of affected joint.  TREATMENT  Treatment initially consists of taking medication and applying ice to reduce pain and inflammation of the affected joint. For injuries to the joints of the leg (knee or ankle), walking with crutches (still allow weight to be placed on the leg) until you walk without a limp is often recommended. You may be asked to perform range-of-motion, stretching, and strengthening exercises. These exercises may be carried out at home, although you may be given a referral to a therapist. Occasionally it may be recommend that you wear a brace, cast, or crutches (for the knee or ankle) to protect or immobilize the joint. If pain persists despite conservative treatment or if loose fragments are present within the joint, surgery is usually recommended. The surgery is typically performed arthroscopically to remove the loose fragments or to re-attach the fragment (if large enough and not deformed). After immobilization or surgery it is important to stretch and strengthen the weakened joint and surrounding muscles (due to the injury, surgery, or the immobilization). This may be done with or without the assistance of a therapist. MEDICATION   If pain  medication is necessary, nonsteroidal anti-inflammatory medications, such as aspirin and ibuprofen, or other minor pain relievers, such as acetaminophen, are often recommended.   Do not take pain medication for 7 days before surgery.   Prescription pain relievers are usually only prescribed after surgery. Use only as directed and only as much as you need.  HEAT AND COLD  Cold treatment (icing) relieves pain and reduces inflammation. Cold treatment should be applied for 10 to 15 minutes every 2 to 3 hours for inflammation and pain and immediately after any activity that aggravates your symptoms. Use ice packs or an ice massage.   Heat treatment may be used prior to performing the stretching and strengthening activities prescribed by your caregiver, physical therapist, or athletic trainer. Use a heat pack or a warm soak.  SEEK MEDICAL CARE IF:   Symptoms worsen or do not improve in 2 weeks despite treatment.   Any of the following occur after surgery:   Signs of infection: fever, increased pain, swelling, redness, drainage, or bleeding in the surgical area.   Pain, numbness, or coldness in the foot or hand.   Blue, gray, or dark color appears in the toenails or fingernails.   New, unexplained symptoms develop (this may be due to side affects of the drugs used in treatment).  Document Released: 06/04/2005 Document Revised: 05/24/2011 Document Reviewed: 09/16/2008 [facility] Patient Information 2012 [place], [place].Cast or Splint Care Casts and splints support injured  limbs and keep bones from moving while they heal.  HOME CARE  Keep the cast or splint uncovered during the drying period.   A plaster cast can take 24 to 48 hours to dry.   A fiberglass cast will dry in less than 1 hour.   Do not rest the cast on anything harder than a pillow for 24 hours.   Do not put weight on your injured limb. Do not put pressure on the cast. Wait for your doctor's approval.   Keep the cast or splint  dry.   Cover the cast or splint with a plastic bag during baths or wet weather.   If you have a cast over your chest and belly (trunk), take sponge baths until the cast is taken off.   Keep your cast or splint clean. Wash a dirty cast with a damp cloth.   Do not put any objects under your cast or splint. Do not scratch the skin under the cast with an object.   Do not take out the padding from inside your cast.   Exercise your joints near the cast as told by your doctor.   Raise (elevate) your injured limb on 1 or 2 pillows for the first 1 to 3 days.  GET HELP RIGHT AWAY IF:  Your cast or splint cracks.   Your cast or splint is too tight or too loose.   You itch badly under the cast.   Your cast gets wet or has a soft spot.   You have a bad smell coming from the cast.   You get an object stuck under the cast.   Your skin around the cast becomes red or raw.   You have new or more pain after the cast is put on.   You have fluid leaking through the cast.   You cannot move your fingers or toes.   Your fingers or toes turn colors or are cool, painful, or puffy (swollen).   You have tingling or lose feeling (numbness) around the injured area.   You have pain or pressure under the cast.   You have trouble breathing or have shortness of breath.   You have chest pain.  MAKE SURE YOU:  Understand these instructions.   Will watch your condition.   Will get help right away if you are not doing well or get worse.  Document Released: 10/04/2010 Document Revised: 05/24/2011 Document Reviewed: 10/04/2010 [facility] Patient Information 2012 [place], [place].Dislocation or Subluxation Dislocation of a joint occurs when ends of two or more adjacent bones no longer touch each other. A subluxation is a minor form of a dislocation, in which two or more adjacent bones are no longer properly aligned. The most common joints susceptible to a dislocation are the shoulder, kneecap, and  fingers.  SYMPTOMS   Sudden pain at the time of injury.   Noticeable deformity in the area of the joint.   Limited range of motion.  CAUSES   Usually a traumatic injury that stretches or tears ligaments that surround a joint and hold the bones together.   Condition present at birth (congenital) in which the joint surfaces are shallow or abnormally formed.   Joint disease such as arthritis or other diseases of ligaments and tissues around a joint.  RISK INCREASES WITH:  Repeated injury to a joint.   Previous dislocation of a joint.   Contact sports (football, rugby, hockey, lacrosse) or sports that require repetitive overhead arm motion (throwing, swimming, volleyball).  Rheumatoid arthritis.   Congenital joint condition.  PREVENTION  Warm up and stretch properly before activity.   Maintain physical fitness:   Joint flexibility.   Muscle strength and endurance.   Cardiovascular fitness.   Wear proper protective equipment and ensure correct fit.   Learn and use proper technique.  PROGNOSIS  This condition is usually curable with prompt treatment. After the dislocation has been put back in place, the joint may require immobilization with a cast, splint, or sling for 2 to 6 weeks, often followed by strength and stretching exercises that may be performed at home or with a therapist. RELATED COMPLICATIONS   Damage to nearby nerves or major blood vessels, causing numbness, coldness, or paleness.   Recurrent injury to the joint.   Arthritis of affected joint.   Fracture of joint.  TREATMENT Treatment initially involves realigning the bones (reduction) of the joint. Reductions should only be performed by someone who is trained in the procedure. After the joint is reduced, medicine and ice should be used to reduce pain and inflammation. The joint may be immobilized to allow for the muscles and ligaments to heal. If a joint is subjected to recurrent dislocations, surgery  may be necessary to tighten or replace injured structures. After surgery, stretching and strengthening exercises may be required. These may be performed at home or with a therapist. MEDICATION   Patients may require medicine to help them relax (sedative) or muscle relaxants in order to reduce the joint.   If pain medicine is necessary, nonsteroidal anti-inflammatory medicines, such as aspirin and ibuprofen, or other minor pain relievers, such as acetaminophen, are often recommended.   Do not take pain medicine for 7 days before surgery.   Prescription pain relievers may be necessary. Use only as directed and only as much as you need.  SEEK MEDICAL CARE IF:   Symptoms get worse or do not improve despite treatment.   You have difficulty moving a joint after injury.   Any extremity becomes numb, pale, or cool after injury. This is an emergency.   Dislocations or subluxations occur repeatedly.  Document Released: 06/04/2005 Document Revised: 05/24/2011 Document Reviewed: 09/16/2008 [facility] Patient Information 2012 [place], [place].Knee, Cartilage (Meniscus) Injury It is suspected that you have a torn cartilage (meniscus) in your knee. The menisci are made of tough cartilage, and fit between the surfaces of the thigh and leg bones. The menisci are "C"shaped and have a wedged profile. The wedged profile helps the stability of the joint by keeping the rounded femur surface from sliding off the flat tibial surface. The menisci are fed (nourished) by small blood vessels; but there is also a large area at the inner edge of the meniscus that does not have a good blood supply (avascular). This presents a problem when there is an injury to the meniscus, because areas without good blood supply heal poorly. As a result when there is a torn cartilage in the knee, surgery is often required to fix it. This is usually done with a surgical procedure less invasive than open surgery (arthroscopy). Some times open  surgery of the knee is required if there is other damage. PURPOSE OF THE MENISCUS The medial meniscus rests on the medial tibial plateau. The tibia is the large bone in your lower leg (the shin bone). The medial tibial plateau is the upper end of the bone making up the inner part of your knee. The lateral meniscus serves the same purpose and is located on the outside of  the knee. The menisci help to distribute your body weight across the knee joint; they act as shock absorbers. Without the meniscus present, the weight of your body would be unevenly applied to the bones in your legs (the femur and tibia). The femur is the large bone in your thigh. This uneven weight distribution would cause increased wear and tear on the cartilage lining the joint surfaces, leading to early damage (arthritis) of these areas. The presence of the menisci cartilage is necessary for a healthy knee. PURPOSE OF THE KNEE CARTILAGE The knee joint is made up of three bones: the thigh bone (femur), the shin bone (tibia), and the knee cap (patella). The surfaces of these bones at the knee joint are covered with cartilage called articular cartilage. This smooth slippery surface allows the bones to slide against each other without causing bone damage. The meniscus sits between these cartilaginous surfaces of the bones. It distributes the weight evenly in the joints and helps with the stability of the joint (keeps the joint steady). HOME CARE INSTRUCTIONS  Use crutches and external braces as instructed.   Once home an ice pack applied to your injured knee may help with discomfort and keep the swelling down. An ice pack can be used for the first couple of days or as instructed.   Only take over-the-counter or prescription medicines for pain, discomfort, or fever as directed by your caregiver.   Call if you do not have relief of pain with medications or if there is increasing in pain.   Call if your foot becomes cold or blue.   You  may resume normal diet and activities as directed.   Make sure to keep your appointment with your follow-up caregiver. This injury may require further evaluation and treatment beyond the temporary treatment given today.  Document Released: 08/25/2002 Document Revised: 05/24/2011 Document Reviewed: 12/17/2008 [facility] Patient Information 2012 [place], [place].Knee, Cartilage (Meniscus) Injury It is suspected that you have a torn cartilage (meniscus) in your knee. The menisci are made of tough cartilage, and fit between the surfaces of the thigh and leg bones. The menisci are "C"shaped and have a wedged profile. The wedged profile helps the stability of the joint by keeping the rounded femur surface from sliding off the flat tibial surface. The menisci are fed (nourished) by small blood vessels; but there is also a large area at the inner edge of the meniscus that does not have a good blood supply (avascular). This presents a problem when there is an injury to the meniscus, because areas without good blood supply heal poorly. As a result when there is a torn cartilage in the knee, surgery is often required to fix it. This is usually done with a surgical procedure less invasive than open surgery (arthroscopy). Some times open surgery of the knee is required if there is other damage. PURPOSE OF THE MENISCUS The medial meniscus rests on the medial tibial plateau. The tibia is the large bone in your lower leg (the shin bone). The medial tibial plateau is the upper end of the bone making up the inner part of your knee. The lateral meniscus serves the same purpose and is located on the outside of the knee. The menisci help to distribute your body weight across the knee joint; they act as shock absorbers. Without the meniscus present, the weight of your body would be unevenly applied to the bones in your legs (the femur and tibia). The femur is the large bone in your thigh.  This uneven weight distribution would cause  increased wear and tear on the cartilage lining the joint surfaces, leading to early damage (arthritis) of these areas. The presence of the menisci cartilage is necessary for a healthy knee. PURPOSE OF THE KNEE CARTILAGE The knee joint is made up of three bones: the thigh bone (femur), the shin bone (tibia), and the knee cap (patella). The surfaces of these bones at the knee joint are covered with cartilage called articular cartilage. This smooth slippery surface allows the bones to slide against each other without causing bone damage. The meniscus sits between these cartilaginous surfaces of the bones. It distributes the weight evenly in the joints and helps with the stability of the joint (keeps the joint steady). HOME CARE INSTRUCTIONS  Use crutches and external braces as instructed.   Once home an ice pack applied to your injured knee may help with discomfort and keep the swelling down. An ice pack can be used for the first couple of days or as instructed.   Only take over-the-counter or prescription medicines for pain, discomfort, or fever as directed by your caregiver.   Call if you do not have relief of pain with medications or if there is increasing in pain.   Call if your foot becomes cold or blue.   You may resume normal diet and activities as directed.   Make sure to keep your appointment with your follow-up caregiver. This injury may require further evaluation and treatment beyond the temporary treatment given today.  Document Released: 08/25/2002 Document Revised: 05/24/2011 Document Reviewed: 12/17/2008 [facility] Patient Information 2012 [place], [place].

## 2011-12-12 NOTE — Anesthesia Postprocedure Evaluation (Signed)
  Anesthesia Post-op Note  Patient: Howard [name]  Procedure(s) Performed: Procedure(s) (LRB): ARTHROSCOPY KNEE (Left)  Patient Location: PACU  Anesthesia Type: General  Level of Consciousness: awake and alert   Airway and Oxygen Therapy: Patient Spontanous Breathing  Post-op Pain: mild  Post-op Assessment: Post-op Vital signs reviewed, Patient's Cardiovascular Status Stable, Respiratory Function Stable, Patent Airway and No signs of Nausea or vomiting  Post-op Vital Signs: stable  Complications: No apparent anesthesia complications

## 2011-12-12 NOTE — Interval H&P Note (Signed)
History and Physical Interval Note:  12/12/2011 12:42 PM  Howard [name]  has presented today for surgery, with the diagnosis of Left Knee Medial Meniscal Tear  The various methods of treatment have been discussed with the patient and family. After consideration of risks, benefits and other options for treatment, the patient has consented to  Procedure(s) (LRB): ARTHROSCOPY KNEE (Left) as a surgical intervention .  The patient's history has been reviewed, patient examined, no change in status, stable for surgery.  I have reviewed the patients' chart and labs.  Questions were answered to the patient's satisfaction.     Loanne Drilling

## 2011-12-12 NOTE — Anesthesia Preprocedure Evaluation (Addendum)
Anesthesia Evaluation  Patient identified by MRN, date of birth, ID band Patient awake    Reviewed: Allergy & Precautions, H&P , NPO status , Patient's Chart, lab work & pertinent test results  Airway Mallampati: II TM Distance: >3 FB Neck ROM: Full    Dental No notable dental hx.    Pulmonary neg pulmonary ROS, sleep apnea and Continuous Positive Airway Pressure Ventilation ,  breath sounds clear to auscultation  Pulmonary exam normal       Cardiovascular hypertension, Pt. on medications negative cardio ROS  Rhythm:Regular Rate:Normal     Neuro/Psych PSYCHIATRIC DISORDERS Depression negative neurological ROS  negative psych ROS   GI/Hepatic negative GI ROS, Neg liver ROS,   Endo/Other  negative endocrine ROSDiabetes mellitus-, Type 2Hypothyroidism   Renal/GU negative Renal ROS  negative genitourinary   Musculoskeletal negative musculoskeletal ROS (+)   Abdominal   Peds negative pediatric ROS (+)  Hematology negative hematology ROS (+)   Anesthesia Other Findings   Reproductive/Obstetrics negative OB ROS                           Anesthesia Physical Anesthesia Plan  ASA: [name]  Anesthesia Plan: General   Post-op Pain Management:    Induction: Intravenous  Airway Management Planned: LMA  Additional Equipment:   Intra-op Plan:   Post-operative Plan: Extubation in OR  Informed Consent: I have reviewed the patients History and Physical, chart, labs and discussed the procedure including the risks, benefits and alternatives for the proposed anesthesia with the patient or authorized representative who has indicated his/her understanding and acceptance.   Dental advisory given  Plan Discussed with: CRNA  Anesthesia Plan Comments:        Anesthesia Quick Evaluation

## 2011-12-13 ENCOUNTER — Encounter (HOSPITAL_COMMUNITY): Payer: Self-pay | Admitting: Orthopedic Surgery

## 2012-10-16 ENCOUNTER — Other Ambulatory Visit: Payer: Self-pay | Admitting: Orthopedic Surgery

## 2012-10-16 ENCOUNTER — Encounter (HOSPITAL_COMMUNITY): Payer: Self-pay | Admitting: Pharmacy Technician

## 2012-10-16 MED ORDER — DEXAMETHASONE SODIUM PHOSPHATE 10 MG/ML IJ SOLN: 10.0000 mg | Freq: Once | INTRAMUSCULAR | Status: DC

## 2012-10-16 NOTE — Progress Notes (Signed)
Preoperative surgical orders have been place into the Epic hospital system for Howard [name] on 10/16/2012, 12:21 PM  by Patrica Duel for surgery on 10/29/2012.  Preop Knee Scope orders including IV Tylenol and IV Decadron as long as there are no contraindications to the above medications. Avel Peace, PA-C

## 2012-10-16 NOTE — Progress Notes (Signed)
Drew-  NEED PRE OP ORDERS WHEN YOU CAN   THANK YOU

## 2012-10-22 ENCOUNTER — Encounter (HOSPITAL_COMMUNITY): Payer: Self-pay

## 2012-10-22 ENCOUNTER — Encounter (HOSPITAL_COMMUNITY)
Admission: RE | Admit: 2012-10-22 | Discharge: 2012-10-22 | Disposition: A | Discharge status: Home / Self Care | Payer: 59 | Source: Ambulatory Visit | Attending: Orthopedic Surgery | Admitting: Orthopedic Surgery

## 2012-10-22 HISTORY — DX: Gastro-esophageal reflux disease without esophagitis: K21.9

## 2012-10-22 LAB — SURGICAL PCR SCREEN
MRSA, PCR: NEGATIVE
Staphylococcus aureus: NEGATIVE

## 2012-10-22 LAB — BASIC METABOLIC PANEL
CO2: 30 mEq/L (ref 19–32)
Calcium: 9.4 mg/dL (ref 8.4–10.5)
GFR calc non Af Amer: >90 mL/min (ref >90)
Glucose, Bld: 91 mg/dL (ref 70–99)
Potassium: 4 mEq/L (ref 3.5–5.1)
Sodium: 137 mEq/L (ref 135–145)

## 2012-10-22 LAB — CBC
Hemoglobin: 14.2 g/dL (ref 13.0–17.0)
Platelets: 264 10*3/uL (ref 150–400)
RBC: 4.91 MIL/uL (ref 4.22–5.81)

## 2012-10-22 NOTE — Pre-Procedure Instructions (Signed)
CXR AND EKG REPORTS 12/06/11 ARE IN EPIC.

## 2012-10-22 NOTE — Patient Instructions (Addendum)
YOUR SURGERY IS SCHEDULED AT [facility]  ON:    Wednesday  5/14  REPORT TO [facility] SHORT STAY CENTER AT:   10:00 AM      PHONE # FOR SHORT STAY IS [phone]  DO NOT EAT OR DRINK ANYTHING AFTER MIDNIGHT THE NIGHT BEFORE YOUR SURGERY.  YOU MAY BRUSH YOUR TEETH, RINSE OUT YOUR MOUTH--BUT NO WATER, NO FOOD, NO CHEWING GUM, NO MINTS, NO CANDIES, NO CHEWING TOBACCO.  PLEASE TAKE THE FOLLOWING MEDICATIONS THE AM OF YOUR SURGERY WITH A FEW SIPS OF WATER:  ALLOPURINOL, PAIN PATCH, CYMBALTA, SYNTHROID, ZANTAC, CRESTOR.    IF YOU HAVE SLEEP APNEA AND USE CPAP OR BIPAP--PLEASE BRING THE MASK AND THE TUBING.  DO NOT BRING YOUR MACHINE.  DO NOT BRING VALUABLES, MONEY, CREDIT CARDS.  DO NOT WEAR JEWELRY, MAKE-UP, NAIL POLISH AND NO METAL PINS OR CLIPS IN YOUR HAIR. CONTACT LENS, DENTURES / PARTIALS, GLASSES SHOULD NOT BE WORN TO SURGERY AND IN MOST CASES-HEARING AIDS WILL NEED TO BE REMOVED.  BRING YOUR GLASSES CASE, ANY EQUIPMENT NEEDED FOR YOUR CONTACT LENS. FOR PATIENTS ADMITTED TO THE HOSPITAL--CHECK OUT TIME THE DAY OF DISCHARGE IS 11:00 AM.  ALL INPATIENT ROOMS ARE PRIVATE - WITH BATHROOM, TELEPHONE, TELEVISION AND WIFI INTERNET.  IF YOU ARE BEING DISCHARGED THE SAME DAY OF YOUR SURGERY--YOU CAN NOT DRIVE YOURSELF HOME--AND SHOULD NOT GO HOME ALONE BY TAXI OR BUS.  NO DRIVING OR OPERATING MACHINERY FOR 24 HOURS FOLLOWING ANESTHESIA / PAIN MEDICATIONS.  PLEASE MAKE ARRANGEMENTS FOR SOMEONE TO BE WITH YOU AT HOME THE FIRST 24 HOURS AFTER SURGERY. RESPONSIBLE DRIVER'S NAME  DONNA Shad - PT'S WIFE                                               PHONE #  [phone]                            PLEASE READ OVER ANY  FACT SHEETS THAT YOU WERE GIVEN: MRSA INFORMATION, INCENTIVE SPIROMETER INFORMATION.   FAILURE TO FOLLOW THESE INSTRUCTIONS MAY RESULT IN THE CANCELLATION OF YOUR SURGERY.   PATIENT SIGNATURE_________________________________

## 2012-10-28 NOTE — H&P (Signed)
CC- Howard [name] is a 57 y.o. male who presents with left knee pain.  HPI- . Knee Pain: Patient presents with knee pain involving the  left knee. Onset of the symptoms was several months ago. Inciting event: none known. Current symptoms include giving out, pain located medially, stiffness and swelling. Pain is aggravated by any weight bearing, going up and down stairs, kneeling, pivoting, rising after sitting, squatting, standing and walking.  Patient has had prior knee problems. Evaluation to date: MRI: abnormal medial meniscal tear. Treatment to date: corticosteroid injection which was not very effective.  Past Medical History  Diagnosis Date  . Hypertension   . Elevated cholesterol   . Arthritis   . Elevated uric acid in blood   . Esophageal spasm   . Reflux   . Hypothyroidism   . Cancer     skin cancer removed  . Hypogonadism male   . Sleep apnea     uses c-pap- SELDOM USES SETTING IS 10  . GERD (gastroesophageal reflux disease)   . History of meniscal tear     LEFT KNEE- CAUSING PAIN AND SWELLING  . Pain     BACK PAIN - BULGING DISKS CERVICAL AND LUMBAR  . Toe pain 10/22/12    2ND TOE RIGHT FOOT HURTING AND PT STATES TOE IS DARK BLUE - STATES HIS MEDICAL DOCTOR DID AN XRAY - DID NOT THING TOE FRACTURED BUT UNSURE WHAT IS WRONG WITH THE TOE - PT NOT AWARE OF ANY INJURY TO THE TOE--PT STATES CIRCULATION SEEMS OK.    Past Surgical History  Procedure Laterality Date  . Knee surgery  1973 / 1983 / 1996    rt and left   . Eye surgery  2010  . Shoulder surgery      rt  . Leg surgery  2011    left  . Carpal tunnel release  2012 /2013    bil  . Knee arthroscopy  12/12/2011    Procedure: ARTHROSCOPY KNEE;  Surgeon: Loanne Drilling, MD;  Location: [facility];  Service: Orthopedics;  Laterality: Left;  medial menisectomy and chondroplasty    Prior to Admission medications   Medication Sig Start Date End Date Taking? Authorizing Provider  acetaminophen (TYLENOL) 650 MG CR tablet  Take 650 mg by mouth every 8 (eight) hours as needed for pain.    Historical Provider, MD  allopurinol (ZYLOPRIM) 300 MG tablet Take 300 mg by mouth every morning.    Historical Provider, MD  aspirin 81 MG tablet Take 81 mg by mouth every morning.    Historical Provider, MD  buprenorphine (BUTRANS) 10 MCG/HR PTWK Place 10 mcg onto the skin once a week.    Historical Provider, MD  celecoxib (CELEBREX) 200 MG capsule Take 200 mg by mouth daily.    Historical Provider, MD  cholecalciferol (VITAMIN D) 1000 UNITS tablet Take 1,000 Units by mouth daily.    Historical Provider, MD  Coenzyme Q10 (CO Q 10 PO) Take 1 tablet by mouth daily.    Historical Provider, MD  diclofenac sodium (VOLTAREN) 1 % GEL Apply 1 application topically 2 (two) times daily as needed (for pain). Apply to painful areas on knees    Historical Provider, MD  DULoxetine (CYMBALTA) 60 MG capsule Take 60 mg by mouth every morning.    Historical Provider, MD  fish oil-omega-3 fatty acids 1000 MG capsule Take 2 g by mouth daily.    Historical Provider, MD  levothyroxine (SYNTHROID, LEVOTHROID) 175 MCG tablet Take 175  mcg by mouth every morning.     Historical Provider, MD  OVER THE COUNTER MEDICATION KRILL OIL  ( MEGA RED )  ONE DAILY FOR JOINT PAIN    Historical Provider, MD  ranitidine (ZANTAC) 150 MG tablet Take 150 mg by mouth daily.     Historical Provider, MD  rosuvastatin (CRESTOR) 10 MG tablet Take 10 mg by mouth every morning.    Historical Provider, MD  Saw Palmetto, Serenoa repens, (SAW PALMETTO PO) Take 1 capsule by mouth daily.    Historical Provider, MD  testosterone (ANDROGEL) 50 MG/5GM GEL Place 15 g onto the skin daily.    Historical Provider, MD  valsartan-hydrochlorothiazide (DIOVAN-HCT) 320-25 MG per tablet Take 1 tablet by mouth every morning.     Historical Provider, MD   KNEE EXAM antalgic gait, soft tissue tenderness over medial joint line, effusion, reduced range of motion, negative drawer sign, collateral  ligaments intact  Physical Examination: General appearance - alert, well appearing, and in no distress Mental status - alert, oriented to person, place, and time Chest - clear to auscultation, no wheezes, rales or rhonchi, symmetric air entry Heart - normal rate, regular rhythm, normal S1, S2, no murmurs, rubs, clicks or gallops Abdomen - soft, nontender, nondistended, no masses or organomegaly Neurological - alert, oriented, normal speech, no focal findings or movement disorder noted   Asessment/Plan--- Left knee medial meniscal tear- - Plan left knee arthroscopy with meniscal debridement. Procedure risks and potential comps discussed with patient who elects to proceed. Goals are decreased pain and increased function with a high likelihood of achieving both

## 2012-10-29 ENCOUNTER — Ambulatory Visit (HOSPITAL_COMMUNITY)
Admission: RE | Admit: 2012-10-29 | Discharge: 2012-10-29 | Disposition: A | Discharge status: Home / Self Care | Payer: 59 | Source: Ambulatory Visit | Attending: Orthopedic Surgery | Admitting: Orthopedic Surgery

## 2012-10-29 ENCOUNTER — Encounter (HOSPITAL_COMMUNITY)
Admission: RE | Disposition: A | Discharge status: Home / Self Care | Payer: Self-pay | Source: Ambulatory Visit | Attending: Orthopedic Surgery

## 2012-10-29 ENCOUNTER — Encounter (HOSPITAL_COMMUNITY): Payer: Self-pay | Admitting: *Deleted

## 2012-10-29 ENCOUNTER — Encounter (HOSPITAL_COMMUNITY): Payer: Self-pay | Admitting: Anesthesiology

## 2012-10-29 ENCOUNTER — Ambulatory Visit (HOSPITAL_COMMUNITY): Payer: 59 | Admitting: Anesthesiology

## 2012-10-29 DIAGNOSIS — IMO0002 Reserved for concepts with insufficient information to code with codable children: Secondary | ICD-10-CM | POA: Insufficient documentation

## 2012-10-29 DIAGNOSIS — K219 Gastro-esophageal reflux disease without esophagitis: Secondary | ICD-10-CM | POA: Insufficient documentation

## 2012-10-29 DIAGNOSIS — X58XXXA Exposure to other specified factors, initial encounter: Secondary | ICD-10-CM | POA: Insufficient documentation

## 2012-10-29 DIAGNOSIS — Z79899 Other long term (current) drug therapy: Secondary | ICD-10-CM | POA: Insufficient documentation

## 2012-10-29 DIAGNOSIS — G4730 Sleep apnea, unspecified: Secondary | ICD-10-CM | POA: Insufficient documentation

## 2012-10-29 DIAGNOSIS — S83242A Other tear of medial meniscus, current injury, left knee, initial encounter: Secondary | ICD-10-CM

## 2012-10-29 DIAGNOSIS — I10 Essential (primary) hypertension: Secondary | ICD-10-CM | POA: Insufficient documentation

## 2012-10-29 DIAGNOSIS — M2240 Chondromalacia patellae, unspecified knee: Secondary | ICD-10-CM | POA: Insufficient documentation

## 2012-10-29 DIAGNOSIS — E039 Hypothyroidism, unspecified: Secondary | ICD-10-CM | POA: Insufficient documentation

## 2012-10-29 HISTORY — PX: KNEE ARTHROSCOPY: SHX127

## 2012-10-29 SURGERY — ARTHROSCOPY, KNEE
Anesthesia: General | Site: Knee | Laterality: Left | Wound class: Clean

## 2012-10-29 MED ORDER — PROPOFOL 10 MG/ML IV BOLUS
INTRAVENOUS | Status: DC | PRN
  Administered 2012-10-29: 200 mg via INTRAVENOUS

## 2012-10-29 MED ORDER — BUPIVACAINE-EPINEPHRINE 0.25% -1:200000 IJ SOLN
INTRAMUSCULAR | Status: DC | PRN
  Administered 2012-10-29: 20 mL

## 2012-10-29 MED ORDER — CEFAZOLIN SODIUM-DEXTROSE 2-3 GM-% IV SOLR
INTRAVENOUS | Status: AC
  Filled 2012-10-29: qty 50

## 2012-10-29 MED ORDER — ACETAMINOPHEN 10 MG/ML IV SOLN
1000.0000 mg | Freq: Once | INTRAVENOUS | Status: AC
  Administered 2012-10-29: 1000 mg via INTRAVENOUS

## 2012-10-29 MED ORDER — MIDAZOLAM HCL 5 MG/5ML IJ SOLN
INTRAMUSCULAR | Status: DC | PRN
  Administered 2012-10-29: 2 mg via INTRAVENOUS

## 2012-10-29 MED ORDER — METHOCARBAMOL 500 MG PO TABS: 500.0000 mg | ORAL_TABLET | Freq: Four times a day (QID) | ORAL | Status: DC

## 2012-10-29 MED ORDER — LACTATED RINGERS IV SOLN: INTRAVENOUS | Status: DC

## 2012-10-29 MED ORDER — SODIUM CHLORIDE 0.9 % IV SOLN: INTRAVENOUS | Status: DC

## 2012-10-29 MED ORDER — HYDROMORPHONE HCL PF 1 MG/ML IJ SOLN
0.2500 mg | INTRAMUSCULAR | Status: DC | PRN
  Administered 2012-10-29 (×2): 0.5 mg via INTRAVENOUS

## 2012-10-29 MED ORDER — CHLORHEXIDINE GLUCONATE 4 % EX LIQD: 60.0000 mL | Freq: Once | CUTANEOUS | Status: DC

## 2012-10-29 MED ORDER — OXYCODONE HCL 5 MG PO TABS: 5.0000 mg | ORAL_TABLET | ORAL | Status: DC | PRN

## 2012-10-29 MED ORDER — GLYCOPYRROLATE 0.2 MG/ML IJ SOLN
INTRAMUSCULAR | Status: DC | PRN
  Administered 2012-10-29: 0.2 mg via INTRAVENOUS

## 2012-10-29 MED ORDER — BUPIVACAINE-EPINEPHRINE PF 0.25-1:200000 % IJ SOLN
INTRAMUSCULAR | Status: AC
  Filled 2012-10-29: qty 30

## 2012-10-29 MED ORDER — ONDANSETRON HCL 4 MG/2ML IJ SOLN
INTRAMUSCULAR | Status: DC | PRN
  Administered 2012-10-29: 4 mg via INTRAVENOUS

## 2012-10-29 MED ORDER — FENTANYL CITRATE 0.05 MG/ML IJ SOLN
INTRAMUSCULAR | Status: DC | PRN
  Administered 2012-10-29 (×2): 50 ug via INTRAVENOUS

## 2012-10-29 MED ORDER — CEFAZOLIN SODIUM-DEXTROSE 2-3 GM-% IV SOLR
2.0000 g | INTRAVENOUS | Status: AC
  Administered 2012-10-29: 2 g via INTRAVENOUS

## 2012-10-29 MED ORDER — METHOCARBAMOL 500 MG PO TABS
ORAL_TABLET | ORAL | Status: AC
  Filled 2012-10-29: qty 1

## 2012-10-29 MED ORDER — LACTATED RINGERS IV SOLN
INTRAVENOUS | Status: DC
  Administered 2012-10-29: 1000 mL via INTRAVENOUS

## 2012-10-29 MED ORDER — HYDROMORPHONE HCL PF 1 MG/ML IJ SOLN
INTRAMUSCULAR | Status: AC
  Filled 2012-10-29: qty 1

## 2012-10-29 MED ORDER — LACTATED RINGERS IR SOLN
Status: DC | PRN
  Administered 2012-10-29 (×3): 3000 mL

## 2012-10-29 MED ORDER — EPHEDRINE SULFATE 50 MG/ML IJ SOLN
INTRAMUSCULAR | Status: DC | PRN
  Administered 2012-10-29: 10 mg via INTRAVENOUS

## 2012-10-29 MED ORDER — ACETAMINOPHEN 10 MG/ML IV SOLN
INTRAVENOUS | Status: AC
  Filled 2012-10-29: qty 100

## 2012-10-29 MED ORDER — PROMETHAZINE HCL 25 MG/ML IJ SOLN: 6.2500 mg | INTRAMUSCULAR | Status: DC | PRN

## 2012-10-29 MED ORDER — METHOCARBAMOL 500 MG PO TABS
500.0000 mg | ORAL_TABLET | Freq: Once | ORAL | Status: AC
  Administered 2012-10-29: 500 mg via ORAL

## 2012-10-29 MED ORDER — LIDOCAINE HCL 1 % IJ SOLN
INTRAMUSCULAR | Status: DC | PRN
  Administered 2012-10-29: 60 mg via INTRADERMAL

## 2012-10-29 MED ORDER — LACTATED RINGERS IV SOLN
INTRAVENOUS | Status: DC | PRN
  Administered 2012-10-29 (×2): via INTRAVENOUS

## 2012-10-29 SURGICAL SUPPLY — 25 items
BANDAGE ELASTIC 6 VELCRO ST LF (GAUZE/BANDAGES/DRESSINGS) ×1 IMPLANT
BLADE 4.2CUDA (BLADE) ×2 IMPLANT
CLOTH BEACON ORANGE TIMEOUT ST (SAFETY) ×2 IMPLANT
CUFF TOURN SGL QUICK 34 (TOURNIQUET CUFF) ×2
CUFF TRNQT CYL 34X4X40X1 (TOURNIQUET CUFF) ×1 IMPLANT
DRAPE U-SHAPE 47X51 STRL (DRAPES) ×2 IMPLANT
DRSG EMULSION OIL 3X3 NADH (GAUZE/BANDAGES/DRESSINGS) ×1 IMPLANT
DURAPREP 26ML APPLICATOR (WOUND CARE) ×2 IMPLANT
GLOVE BIO SURGEON STRL SZ8 (GLOVE) ×2 IMPLANT
GLOVE BIOGEL PI IND STRL 8 (GLOVE) ×1 IMPLANT
GLOVE BIOGEL PI INDICATOR 8 (GLOVE) ×1
GOWN STRL NON-REIN LRG LVL3 (GOWN DISPOSABLE) ×2 IMPLANT
MANIFOLD NEPTUNE II (INSTRUMENTS) ×3 IMPLANT
PACK ARTHROSCOPY WL (CUSTOM PROCEDURE TRAY) ×2 IMPLANT
PACK ICE MAXI GEL EZY WRAP (MISCELLANEOUS) ×6 IMPLANT
PAD ABD 7.5X8 STRL (GAUZE/BANDAGES/DRESSINGS) ×1 IMPLANT
PADDING CAST COTTON 6X4 STRL (CAST SUPPLIES) ×3 IMPLANT
POSITIONER SURGICAL ARM (MISCELLANEOUS) ×2 IMPLANT
SET ARTHROSCOPY TUBING (MISCELLANEOUS) ×2
SET ARTHROSCOPY TUBING LN (MISCELLANEOUS) ×1 IMPLANT
SPONGE GAUZE 4X4 12PLY (GAUZE/BANDAGES/DRESSINGS) ×1 IMPLANT
SUT ETHILON 4 0 PS 2 18 (SUTURE) ×2 IMPLANT
TOWEL OR 17X26 10 PK STRL BLUE (TOWEL DISPOSABLE) ×2 IMPLANT
WAND 90 DEG TURBOVAC W/CORD (SURGICAL WAND) ×2 IMPLANT
WRAP KNEE MAXI GEL POST OP (GAUZE/BANDAGES/DRESSINGS) ×4 IMPLANT

## 2012-10-29 NOTE — Transfer of Care (Signed)
Immediate Anesthesia Transfer of Care Note  Patient: Howard [name]  Procedure(s) Performed: Procedure(s): LEFT ARTHROSCOPY KNEE WITH CHONDROPLASTY AND DEBRIDMENT OF MENISCUS (Left)  Patient Location: PACU  Anesthesia Type:General  Level of Consciousness: awake, alert , oriented and patient cooperative  Airway & Oxygen Therapy: Patient Spontanous Breathing and Patient connected to face mask oxygen  Post-op Assessment: Report given to PACU RN, Post -op Vital signs reviewed and stable and Patient moving all extremities X 4  Post vital signs: Reviewed and stable  Complications: No apparent anesthesia complications

## 2012-10-29 NOTE — Anesthesia Postprocedure Evaluation (Signed)
Anesthesia Post Note  Patient: Howard [name]  Procedure(s) Performed: Procedure(s) (LRB): LEFT ARTHROSCOPY KNEE WITH CHONDROPLASTY AND DEBRIDMENT OF MENISCUS (Left)  Anesthesia type: General  Patient location: PACU  Post pain: Pain level controlled  Post assessment: Post-op Vital signs reviewed  Last Vitals:  Filed Vitals:   10/29/12 1345  BP: 132/91  Pulse: 71  Temp:   Resp: 16    Post vital signs: Reviewed  Level of consciousness: sedated  Complications: No apparent anesthesia complications

## 2012-10-29 NOTE — Op Note (Signed)
Preoperative diagnosis-  Left knee medial meniscal tear  Postoperative diagnosis Left- knee medial meniscal tear   Plus Left trochlear and medial femoral chondral defect  Procedure- Left knee arthroscopy with medial Meniscal debridement and chondroplasty   Surgeon- Gus Rankin. Ella Guillotte, MD  Anesthesia-General  EBL-  minimal Complications- None  Condition- PACU - hemodynamically stable.  Brief clinical note- -Howard [name] is a 57 y.o.  male with a several month history of left knee pain, recurrent effusions and mechanical symptoms. Exam and history suggested medial meniscal tear confirmed by MRI. The patient presents now for arthroscopy and debridement   Procedure in detail -       After successful administration of General anesthetic, a tourmiquet is placed high on the Left  thigh and the Left lower extremity is prepped and draped in the usual sterile fashion. Time out is performed by the surgical team. Standard superomedial and inferolateral portal sites are marked and incisions made with an 11 blade. The inflow cannula is passed through the superomedial portal and camera through the inferolateral portal and inflow is initiated. Arthroscopic visualization proceeds.      The undersurface of the patella and trochlea are visualized and there is an area of exposed bone on the undersurface of the patella with surounding stable Grade [name] chondromalacia. The trochlea has unstable grade [name] changes centrally.The medial and lateral gutters are visualized and there are  no loose bodies. Flexion and valgus force is applied to the knee and the medial compartment is entered. A spinal needle is passed into the joint through the site marked for the inferomedial portal. A small incision is made and the dilator passed into the joint. The findings for the medial compartment are medial meniscal tear posterior horn and focal area of grade [name] unstable chondromalacia . The tear is debrided to a stable base with  baskets and a shaver and sealed off with the Arthrocare. The shaver is used to debride the unstable cartilage to a stable cartilaginous base with stable edges. It is probed and found to be stable. Similar debridement is performed on the unstable lesion in the trochlea.    The intercondylar notch is visualized and the ACL appears normal. The lateral compartment is entered and the findings are normal .      The joint is again inspected and there are no other tears, defects or loose bodies identified. The arthroscopic equipment is then removed from the inferior portals which are closed with interrupted 4-0 nylon. 20 ml of .25% Marcaine with epinephrine are injected through the inflow cannula and the cannula is then removed and the portal closed with nylon. The incisions are cleaned and dried and a bulky sterile dressing is applied. The patient is then awakened and transported to recovery in stable condition.   10/29/2012, 1:15 PM

## 2012-10-29 NOTE — Anesthesia Preprocedure Evaluation (Addendum)
Anesthesia Evaluation  Patient identified by MRN, date of birth, ID band Patient awake    Reviewed: Allergy & Precautions, H&P , NPO status , Patient's Chart, lab work & pertinent test results  Airway Mallampati: II TM Distance: >3 FB Neck ROM: Full    Dental no notable dental hx. (+) Teeth Intact and Dental Advisory Given   Pulmonary neg pulmonary ROS, sleep apnea and Continuous Positive Airway Pressure Ventilation ,  breath sounds clear to auscultation  Pulmonary exam normal       Cardiovascular hypertension, Pt. on medications negative cardio ROS  Rhythm:Regular Rate:Normal     Neuro/Psych PSYCHIATRIC DISORDERS Depression  Neuromuscular disease negative neurological ROS  negative psych ROS   GI/Hepatic negative GI ROS, Neg liver ROS, GERD-  ,  Endo/Other  negative endocrine ROSdiabetes, Type 2Hypothyroidism   Renal/GU negative Renal ROS  negative genitourinary   Musculoskeletal negative musculoskeletal ROS (+)   Abdominal   Peds  Hematology negative hematology ROS (+)   Anesthesia Other Findings   Reproductive/Obstetrics                           Anesthesia Physical Anesthesia Plan  ASA: II  Anesthesia Plan: General   Post-op Pain Management:    Induction: Intravenous  Airway Management Planned: LMA  Additional Equipment:   Intra-op Plan:   Post-operative Plan: Extubation in OR  Informed Consent: I have reviewed the patients History and Physical, chart, labs and discussed the procedure including the risks, benefits and alternatives for the proposed anesthesia with the patient or authorized representative who has indicated his/her understanding and acceptance.   Dental advisory given  Plan Discussed with: CRNA  Anesthesia Plan Comments:         Anesthesia Quick Evaluation

## 2012-10-29 NOTE — Preoperative (Signed)
Beta Blockers   Reason not to administer Beta Blockers:Not Applicable 

## 2012-10-29 NOTE — Interval H&P Note (Signed)
History and Physical Interval Note:  10/29/2012 12:19 PM  Howard [name]  has presented today for surgery, with the diagnosis of left knee medial miniscal tear  The various methods of treatment have been discussed with the patient and family. After consideration of risks, benefits and other options for treatment, the patient has consented to  Procedure(s): LEFT ARTHROSCOPY KNEE WITH CHONDROPLASTY AND DEBRIDMENT (Left) as a surgical intervention .  The patient's history has been reviewed, patient examined, no change in status, stable for surgery.  I have reviewed the patient's chart and labs.  Questions were answered to the patient's satisfaction.     Loanne Drilling

## 2012-10-30 ENCOUNTER — Encounter (HOSPITAL_COMMUNITY): Payer: Self-pay | Admitting: Orthopedic Surgery

## 2012-11-21 DIAGNOSIS — M109 Gout, unspecified: Secondary | ICD-10-CM | POA: Insufficient documentation

## 2013-06-18 HISTORY — PX: PR ANES; TOTAL KNEE REPLACEMENT: AN27447

## 2013-10-30 ENCOUNTER — Ambulatory Visit
Admission: RE | Admit: 2013-10-30 | Discharge: 2013-10-30 | Disposition: A | Discharge status: Home / Self Care | Payer: 59 | Source: Ambulatory Visit | Attending: Family Medicine | Admitting: Family Medicine

## 2013-10-30 ENCOUNTER — Other Ambulatory Visit: Payer: Self-pay | Admitting: Family Medicine

## 2013-10-30 DIAGNOSIS — R51 Headache: Principal | ICD-10-CM

## 2013-10-30 DIAGNOSIS — R519 Headache, unspecified: Secondary | ICD-10-CM

## 2013-10-30 MED ORDER — IOHEXOL 300 MG/ML  SOLN
100.0000 mL | Freq: Once | INTRAMUSCULAR | Status: AC | PRN
  Administered 2013-10-30: 100 mL via INTRAVENOUS

## 2013-12-24 DIAGNOSIS — F329 Major depressive disorder, single episode, unspecified: Secondary | ICD-10-CM | POA: Insufficient documentation

## 2014-06-21 ENCOUNTER — Ambulatory Visit: Payer: Self-pay | Admitting: Orthopedic Surgery

## 2014-06-21 NOTE — Progress Notes (Signed)
Preoperative surgical orders have been place into the Epic hospital system for Howard [name] on 06/21/2014, 12:33 PM  by Mickel Crow for surgery on 07-05-2014.  Preop Total Knee orders including Experal, IV Tylenol, and IV Decadron as long as there are no contraindications to the above medications. Arlee Muslim, PA-C

## 2014-06-28 NOTE — Patient Instructions (Addendum)
Howard [name]  06/28/2014   Your procedure is scheduled on: 07/05/14   Report to [facility] Main  Entrance and follow signs to               [place] at 9:45 AM.   Call this number if you have problems the morning of surgery [phone]   Remember:  Do not eat food or drink liquids :After Midnight.     Take these medicines the morning of surgery with A SIP OF WATER: ALLOPURINOL / CRESTOR / CYMBALTA / PEPCID / LEVOTHYROXINE                               You may not have any metal on your body including hair pins and              piercings  Do not wear jewelry, make-up, lotions, powders or perfumes.             Do not wear nail polish.  Do not shave  48 hours prior to surgery.              Men may shave face and neck.   Do not bring valuables to the hospital. [place].  Contacts, dentures or bridgework may not be worn into surgery.  Leave suitcase in the car. After surgery it may be brought to your room.     Patients discharged the day of surgery will not be allowed to drive home.  Name and phone number of your driver:  Special Instructions: N/A              Please read over the following fact sheets you were given: _____________________________________________________________________                                                     [place]  Before surgery, you can play an important role.  Because skin is not sterile, your skin needs to be as free of germs as possible.  You can reduce the number of germs on your skin by washing with CHG (chlorahexidine gluconate) soap before surgery.  CHG is an antiseptic cleaner which kills germs and bonds with the skin to continue killing germs even after washing. Please DO NOT use if you have an allergy to CHG or antibacterial soaps.  If your skin becomes reddened/irritated stop using the CHG and inform your nurse when you  arrive at Short Stay. Do not shave (including legs and underarms) for at least 48 hours prior to the first CHG shower.  You may shave your face. Please follow these instructions carefully:   1.  Shower with CHG Soap the night before surgery and the  morning of Surgery.   2.  If you choose to wash your hair, wash your hair first as usual with your  normal  Shampoo.   3.  After you shampoo, rinse your hair and body thoroughly to remove the  shampoo.  4.  Use CHG as you would any other liquid soap.  You can apply chg directly  to the skin and wash . Gently wash with scrungie or clean wascloth    5.  Apply the CHG Soap to your body ONLY FROM THE NECK DOWN.   Do not use on open                           Wound or open sores. Avoid contact with eyes, ears mouth and genitals (private parts).                        Genitals (private parts) with your normal soap.              6.  Wash thoroughly, paying special attention to the area where your surgery  will be performed.   7.  Thoroughly rinse your body with warm water from the neck down.   8.  DO NOT shower/wash with your normal soap after using and rinsing off  the CHG Soap .                9.  Pat yourself dry with a clean towel.             10.  Wear clean pajamas.             11.  Place clean sheets on your bed the night of your first shower and do not  sleep with pets.  Day of Surgery : Do not apply any lotions/deodorants the morning of surgery.  Please wear clean clothes to the hospital/surgery center.  FAILURE TO FOLLOW THESE INSTRUCTIONS MAY RESULT IN THE CANCELLATION OF YOUR SURGERY    PATIENT SIGNATURE_________________________________  ______________________________________________________________________     Howard [name]  An incentive spirometer is a tool that can help keep your lungs clear and active. This tool measures how well you are filling your lungs with each breath.  Taking long deep breaths may help reverse or decrease the chance of developing breathing (pulmonary) problems (especially infection) following:  A long period of time when you are unable to move or be active. BEFORE THE PROCEDURE   If the spirometer includes an indicator to show your best effort, your nurse or respiratory therapist will set it to a desired goal.  If possible, sit up straight or lean slightly forward. Try not to slouch.  Hold the incentive spirometer in an upright position. INSTRUCTIONS FOR USE   Sit on the edge of your bed if possible, or sit up as far as you can in bed or on a chair.  Hold the incentive spirometer in an upright position.  Breathe out normally.  Place the mouthpiece in your mouth and seal your lips tightly around it.  Breathe in slowly and as deeply as possible, raising the piston or the ball toward the top of the column.  Hold your breath for 3-5 seconds or for as long as possible. Allow the piston or ball to fall to the bottom of the column.  Remove the mouthpiece from your mouth and breathe out normally.  Rest for a few seconds and repeat Steps 1 through 7 at least 10 times every 1-2 hours when you are awake. Take your time and take a few normal breaths between deep breaths.  The spirometer may include an indicator to show your best effort. Use the indicator as a goal to work toward during  each repetition.  After each set of 10 deep breaths, practice coughing to be sure your lungs are clear. If you have an incision (the cut made at the time of surgery), support your incision when coughing by placing a pillow or rolled up towels firmly against it. Once you are able to get out of bed, walk around indoors and cough well. You may stop using the incentive spirometer when instructed by your caregiver.  RISKS AND COMPLICATIONS  Take your time so you do not get dizzy or light-headed.  If you are in pain, you may need to take or ask for pain medication  before doing incentive spirometry. It is harder to take a deep breath if you are having pain. AFTER USE  Rest and breathe slowly and easily.  It can be helpful to keep track of a log of your progress. Your caregiver can provide you with a simple table to help with this. If you are using the spirometer at home, follow these instructions: [place] IF:   You are having difficultly using the spirometer.  You have trouble using the spirometer as often as instructed.  Your pain medication is not giving enough relief while using the spirometer.  You develop fever of 100.5 F (38.1 C) or higher. SEEK IMMEDIATE MEDICAL CARE IF:   You cough up bloody sputum that had not been present before.  You develop fever of 102 F (38.9 C) or greater.  You develop worsening pain at or near the incision site. MAKE SURE YOU:   Understand these instructions.  Will watch your condition.  Will get help right away if you are not doing well or get worse. Document Released: 10/15/2006 Document Revised: 08/27/2011 Document Reviewed: 12/16/2006 ExitCare Patient Information 2014 ExitCare, [place].   ________________________________________________________________________  WHAT IS A BLOOD TRANSFUSION? Blood Transfusion Information  A transfusion is the replacement of blood or some of its parts. Blood is made up of multiple cells which provide different functions.  Red blood cells carry oxygen and are used for blood loss replacement.  White blood cells fight against infection.  Platelets control bleeding.  Plasma helps clot blood.  Other blood products are available for specialized needs, such as hemophilia or other clotting disorders. BEFORE THE TRANSFUSION  Who gives blood for transfusions?   Healthy volunteers who are fully evaluated to make sure their blood is safe. This is blood bank blood. Transfusion therapy is the safest it has ever been in the practice of medicine. Before blood is  taken from a donor, a complete history is taken to make sure that person has no history of diseases nor engages in risky social behavior (examples are intravenous drug use or sexual activity with multiple partners). The donor's travel history is screened to minimize risk of transmitting infections, such as malaria. The donated blood is tested for signs of infectious diseases, such as HIV and hepatitis. The blood is then tested to be sure it is compatible with you in order to minimize the chance of a transfusion reaction. If you or a relative donates blood, this is often done in anticipation of surgery and is not appropriate for emergency situations. It takes many days to process the donated blood. RISKS AND COMPLICATIONS Although transfusion therapy is very safe and saves many lives, the main dangers of transfusion include:   Getting an infectious disease.  Developing a transfusion reaction. This is an allergic reaction to something in the blood you were given. Every precaution is taken to prevent  this. The decision to have a blood transfusion has been considered carefully by your caregiver before blood is given. Blood is not given unless the benefits outweigh the risks. AFTER THE TRANSFUSION  Right after receiving a blood transfusion, you will usually feel much better and more energetic. This is especially true if your red blood cells have gotten low (anemic). The transfusion raises the level of the red blood cells which carry oxygen, and this usually causes an energy increase.  The nurse administering the transfusion will monitor you carefully for complications. HOME CARE INSTRUCTIONS  No special instructions are needed after a transfusion. You may find your energy is better. Speak with your caregiver about any limitations on activity for underlying diseases you may have. SEEK MEDICAL CARE IF:   Your condition is not improving after your transfusion.  You develop redness or irritation at the  intravenous (IV) site. SEEK IMMEDIATE MEDICAL CARE IF:  Any of the following symptoms occur over the next 12 hours:  Shaking chills.  You have a temperature by mouth above 102 F (38.9 C), not controlled by medicine.  Chest, back, or muscle pain.  People around you feel you are not acting correctly or are confused.  Shortness of breath or difficulty breathing.  Dizziness and fainting.  You get a rash or develop hives.  You have a decrease in urine output.  Your urine turns a dark color or changes to pink, red, or brown. Any of the following symptoms occur over the next 10 days:  You have a temperature by mouth above 102 F (38.9 C), not controlled by medicine.  Shortness of breath.  Weakness after normal activity.  The white part of the eye turns yellow (jaundice).  You have a decrease in the amount of urine or are urinating less often.  Your urine turns a dark color or changes to pink, red, or brown. Document Released: 06/01/2000 Document Revised: 08/27/2011 Document Reviewed: 01/19/2008 [facility] Patient Information 2014 [place], [place].  _______________________________________________________________________

## 2014-06-29 ENCOUNTER — Encounter (HOSPITAL_COMMUNITY): Payer: Self-pay

## 2014-06-29 ENCOUNTER — Encounter (HOSPITAL_COMMUNITY)
Admission: RE | Admit: 2014-06-29 | Discharge: 2014-06-29 | Disposition: A | Discharge status: Home / Self Care | Payer: 59 | Source: Ambulatory Visit | Attending: Orthopedic Surgery | Admitting: Orthopedic Surgery

## 2014-06-29 ENCOUNTER — Other Ambulatory Visit: Payer: Self-pay

## 2014-06-29 ENCOUNTER — Ambulatory Visit (HOSPITAL_COMMUNITY)
Admission: RE | Admit: 2014-06-29 | Discharge: 2014-06-29 | Disposition: A | Discharge status: Home / Self Care | Payer: 59 | Source: Ambulatory Visit | Attending: Anesthesiology | Admitting: Anesthesiology

## 2014-06-29 DIAGNOSIS — I10 Essential (primary) hypertension: Secondary | ICD-10-CM | POA: Diagnosis not present

## 2014-06-29 DIAGNOSIS — Z01818 Encounter for other preprocedural examination: Secondary | ICD-10-CM | POA: Diagnosis present

## 2014-06-29 HISTORY — DX: Pain in right shoulder: M25.511

## 2014-06-29 HISTORY — DX: Pain in left shoulder: M25.512

## 2014-06-29 HISTORY — DX: Family history of other specified conditions: Z84.89

## 2014-06-29 HISTORY — DX: Anxiety disorder, unspecified: F41.9

## 2014-06-29 HISTORY — DX: Personal history of other malignant neoplasm of skin: Z85.828

## 2014-06-29 HISTORY — DX: Prediabetes: R73.03

## 2014-06-29 LAB — COMPREHENSIVE METABOLIC PANEL
ALBUMIN: 4.4 g/dL (ref 3.5–5.2)
ALK PHOS: 66 U/L (ref 39–117)
ALT: 33 U/L (ref 0–53)
ANION GAP: 8 (ref 5–15)
AST: 42 U/L — ABNORMAL HIGH (ref 0–37)
BUN: 15 mg/dL (ref 6–23)
CHLORIDE: 98 meq/L (ref 96–112)
CO2: 28 mmol/L (ref 19–32)
Calcium: 9.5 mg/dL (ref 8.4–10.5)
Creatinine, Ser: 0.74 mg/dL (ref 0.50–1.35)
GFR calc Af Amer: >90 mL/min (ref >90)
Glucose, Bld: 104 mg/dL — ABNORMAL HIGH (ref 70–99)
POTASSIUM: 4.4 mmol/L (ref 3.5–5.1)
SODIUM: 134 mmol/L — AB (ref 135–145)
Total Bilirubin: 0.7 mg/dL (ref 0.3–1.2)
Total Protein: 7.7 g/dL (ref 6.0–8.3)

## 2014-06-29 LAB — URINALYSIS, ROUTINE W REFLEX MICROSCOPIC
Bilirubin Urine: NEGATIVE
GLUCOSE, UA: NEGATIVE mg/dL
Hgb urine dipstick: NEGATIVE
KETONES UR: NEGATIVE mg/dL
LEUKOCYTES UA: NEGATIVE
NITRITE: NEGATIVE
PROTEIN: NEGATIVE mg/dL
Specific Gravity, Urine: 1.01 (ref 1.005–1.030)
Urobilinogen, UA: 0.2 mg/dL (ref 0.0–1.0)
pH: 7 (ref 5.0–8.0)

## 2014-06-29 LAB — CBC
HEMATOCRIT: 40.7 % (ref 39.0–52.0)
Hemoglobin: 13.8 g/dL (ref 13.0–17.0)
MCH: 31.2 pg (ref 26.0–34.0)
MCHC: 33.9 g/dL (ref 30.0–36.0)
MCV: 91.9 fL (ref 78.0–100.0)
Platelets: 266 10*3/uL (ref 150–400)
RBC: 4.43 MIL/uL (ref 4.22–5.81)
RDW: 12.4 % (ref 11.5–15.5)
WBC: 6.8 10*3/uL (ref 4.0–10.5)

## 2014-06-29 LAB — APTT: APTT: 33 s (ref 24–37)

## 2014-06-29 LAB — SURGICAL PCR SCREEN
MRSA, PCR: NEGATIVE
Staphylococcus aureus: NEGATIVE

## 2014-06-29 LAB — PROTIME-INR
INR: 0.94 (ref 0.00–1.49)
PROTHROMBIN TIME: 12.7 s (ref 11.6–15.2)

## 2014-07-04 ENCOUNTER — Ambulatory Visit: Payer: Self-pay | Admitting: Orthopedic Surgery

## 2014-07-04 NOTE — H&P (Signed)
Howard [name] DOB: September 21, 1955 Married / Language: English / Race: White Male Date of Admission:  07/05/2014 CC:  Right Knee Pain History of Present Illness ([name] PA-C; 06/07/2014 12:08 PM) The patient is a 59 year old male who comes in for a preoperative History and Physical. The patient is scheduled for a right total knee arthroplasty to be performed by Dr. Dione Plover. Aluisio, MD at [facility] on 07-05-2014. The patient is being followed for their right knee pain and osteoarthritis. They are now out from a cortisone injection. Symptoms reported today include: pain and swelling. The patient feels that they are doing well and report their pain level to be mild to moderate. The following medication has been used for pain control: antiinflammatory medication (Celebrex). The patient has reported improvement of their symptoms with: Cortisone injections. The pain had gotten progressively worse. He had an aspiration and cortisone injection a couple of months ago and the swelling came back quickly. He was able to go [place] seas for [place] and tolerated it well. The knee has gotten to the point where it is affecting everything he does. It is limiting what he can and cannot do. He does have pain even at night. He is ready to proceed with surgery at this time.  They have been treated conservatively in the past for the above stated problem and despite conservative measures, they continue to have progressive pain and severe functional limitations and dysfunction. They have failed non-operative management including home exercise, medications, and injections. It is felt that they would benefit from undergoing total joint replacement. Risks and benefits of the procedure have been discussed with the patient and they elect to proceed with surgery. There are no active contraindications to surgery such as ongoing infection or rapidly progressive neurological disease.  Problem List/Past  Medical ([name], [name] PA-C; 06/07/2014 12:09 PM) S/P arthroscopy of knee (V45.89) Primary osteoarthritis of right knee (M17.11) Carpal tunnel syndrome (G56.00) Chronic Pain Hypercholesterolemia Gout High blood pressure Skin Cancer Basal Cell Hypothyroidism Osteoarthritis Sleep Apnea Gastroesophageal Reflux Disease mild Degenerative Disc Disease  Allergies Sulfanilamide  Rash.  Family History ([name], [name] PA-C; 06/07/2014 9:26 AM) Osteoarthritis mother and father Kidney disease father Hypertension father Osteoporosis father Heart disease in male family member before age 46 Cerebrovascular Accident mother Heart Disease father Diabetes Mellitus father Cancer father Congestive Heart Failure father  Social History ([name], [name] PA-C; 06/07/2014 9:26 AM) Tobacco / smoke exposure no Tobacco use Former smoker. former smoker; smoke(d) 1 pack(s) per day Living situation live with spouse Alcohol use Occasional alcohol use. current drinker; drinks beer; 5-7 per week Number of flights of stairs before winded greater than 5 Pain Contract yes Marital status married Children 2 Copy of Drug/Alcohol Rehab (Previously) no Current work status working part time Drug/Alcohol Rehab (Currently) no Exercise Exercises weekly; does other Illicit drug use no  Medication History ([name], [name] PA-C; 06/07/2014 9:26 AM) Voltaren (1% Gel, Transdermal) Active. Butrans (10MCG/HR Patch Weekly, Transdermal) Active. CeleBREX (200MG  Capsule, Oral) Active. Ketamine HCl Active. Fish Oil Active. Allopurinol (300MG  Tablet, Oral) Active. Cymbalta (60MG  Capsule DR Part, Oral) Active. Diovan HCT (320-12.5MG  Tablet, Oral) Active. Testim (50MG /5GM Gel, Transdermal) Active. Crestor (10MG  Tablet, Oral) Active. Synthroid (175MCG Tablet, Oral) Active. Vitamin D (400UNIT Capsule, Oral) Active. Calcium 600  (1500MG  Tablet, Oral) Active. Ranitidine HCl (150MG  Tablet, Oral) Active. Flax Seed Oil (1000MG  Capsule, Oral) Active.  Past Surgical History ([name], [name] PA-C; 06/07/2014  12:10 PM) Anal Fissure Repair Rotator Cuff Repair Date: 11/2010. right Arthroscopy of Knee bilateral Arthroscopy of Shoulder right Foot Surgery left   Review of Systems ([name] PA-C; 06/07/2014 12:11 PM) General Not Present- Chills, Fatigue, Fever, Memory Loss, Night Sweats, Weight Gain and Weight Loss. Skin Not Present- Eczema, Hives, Itching, Lesions and Rash. HEENT Not Present- Dentures, Double Vision, Headache, Hearing Loss, Tinnitus and Visual Loss. Respiratory Not Present- Allergies, Chronic Cough, Coughing up blood, Shortness of breath at rest and Shortness of breath with exertion. Cardiovascular Not Present- Chest Pain, Difficulty Breathing Lying Down, Murmur, Palpitations, Racing/skipping heartbeats and Swelling. Gastrointestinal Not Present- Abdominal Pain, Bloody Stool, Constipation, Diarrhea, Difficulty Swallowing, Heartburn, Jaundice, Loss of appetitie, Nausea and Vomiting. Male Genitourinary Present- Urinating at Night. Not Present- Blood in Urine, Discharge, Flank Pain, Incontinence, Painful Urination, Urgency, Urinary frequency, Urinary Retention and Weak urinary stream. Musculoskeletal Present- Joint Pain and Joint Swelling. Not Present- Back Pain, Morning Stiffness, Muscle Pain, Muscle Weakness and Spasms. Neurological Not Present- Blackout spells, Difficulty with balance, Dizziness, Paralysis, Tremor and Weakness. Psychiatric Not Present- Insomnia.   Vitals ([name] PA-C; 06/07/2014 9:33 AM) 06/07/2014 9:31 AM Weight: 245 lb Height: 74in Weight was reported by patient. Height was reported by patient. Body Surface Area: 2.37 m Body Mass Index: 31.46 kg/m  BP: 130/82 (Sitting, Right Arm, Standard)   Physical Exam (Alexzandrew L.  Perkins [name] PA-C; 06/07/2014 9:21 AM) General Mental Status -Alert, cooperative and good historian. General Appearance-pleasant, Not in acute distress. Orientation-Oriented X3. Build & Nutrition-Well nourished and Well developed.  Head and Neck Head-normocephalic, atraumatic . Neck Global Assessment - supple, no bruit auscultated on the right, no bruit auscultated on the left.  Eye Pupil - Bilateral-Regular and Round. Motion - Bilateral-EOMI.  Chest and Lung Exam Auscultation Breath sounds - clear at anterior chest wall and clear at posterior chest wall. Adventitious sounds - No Adventitious sounds.  Cardiovascular Auscultation Rhythm - Regular rate and rhythm. Heart Sounds - S1 WNL and S2 WNL. Murmurs & Other Heart Sounds - Auscultation of the heart reveals - No Murmurs.  Abdomen Palpation/Percussion Tenderness - Abdomen is non-tender to palpation. Rigidity (guarding) - Abdomen is soft. Auscultation Auscultation of the abdomen reveals - Bowel sounds normal.  Male Genitourinary Note: Not done, not pertinent to present illness   Musculoskeletal Note: On exam well developed male in no distress. The right knee moderate effusion. There is marked crepitus on range of motion, range 10 to 120. He is tender medial and lateral. There is no instability noted. Pulse sensation and motor intact distally.   Assessment & Plan ([name] PA-C; 06/07/2014 9:37 AM) Primary osteoarthritis of right knee (M17.11) Current Plans Pt Education - Aluisio - Postoperative Constipation Education: discussed with patient and provided information. Pt Education - [place] Procedure Education: discussed with patient and provided information. Note:Plan is for a Right Total Knee Replacement by Dr. Wynelle Link.  Plan is to go home.  PCP - Dr. Crist Infante - Patient has been seen preoperatively and felt to be stable for surgery.  The patient does not have any  contraindications and will receive TXA (tranexamic acid) prior to surgery.  Signed electronically by Joelene Millin, [name] PA-C

## 2014-07-05 ENCOUNTER — Inpatient Hospital Stay (HOSPITAL_COMMUNITY)
Admission: RE | Admit: 2014-07-05 | Discharge: 2014-07-07 | DRG: 470 | Disposition: A | Discharge status: Home Health | Payer: 59 | Source: Ambulatory Visit | Attending: Orthopedic Surgery | Admitting: Orthopedic Surgery

## 2014-07-05 ENCOUNTER — Inpatient Hospital Stay (HOSPITAL_COMMUNITY): Payer: 59 | Admitting: Anesthesiology

## 2014-07-05 ENCOUNTER — Encounter (HOSPITAL_COMMUNITY)
Admission: RE | Disposition: A | Discharge status: Home Health | Payer: Self-pay | Source: Ambulatory Visit | Attending: Orthopedic Surgery

## 2014-07-05 ENCOUNTER — Encounter (HOSPITAL_COMMUNITY): Payer: Self-pay | Admitting: *Deleted

## 2014-07-05 DIAGNOSIS — G4730 Sleep apnea, unspecified: Secondary | ICD-10-CM | POA: Diagnosis present

## 2014-07-05 DIAGNOSIS — M25561 Pain in right knee: Secondary | ICD-10-CM | POA: Diagnosis present

## 2014-07-05 DIAGNOSIS — K219 Gastro-esophageal reflux disease without esophagitis: Secondary | ICD-10-CM | POA: Diagnosis present

## 2014-07-05 DIAGNOSIS — Z79899 Other long term (current) drug therapy: Secondary | ICD-10-CM

## 2014-07-05 DIAGNOSIS — M1710 Unilateral primary osteoarthritis, unspecified knee: Secondary | ICD-10-CM | POA: Diagnosis present

## 2014-07-05 DIAGNOSIS — E039 Hypothyroidism, unspecified: Secondary | ICD-10-CM | POA: Diagnosis present

## 2014-07-05 DIAGNOSIS — M109 Gout, unspecified: Secondary | ICD-10-CM | POA: Diagnosis present

## 2014-07-05 DIAGNOSIS — I10 Essential (primary) hypertension: Secondary | ICD-10-CM | POA: Diagnosis present

## 2014-07-05 DIAGNOSIS — M1711 Unilateral primary osteoarthritis, right knee: Secondary | ICD-10-CM

## 2014-07-05 DIAGNOSIS — Z87891 Personal history of nicotine dependence: Secondary | ICD-10-CM | POA: Diagnosis not present

## 2014-07-05 DIAGNOSIS — M179 Osteoarthritis of knee, unspecified: Secondary | ICD-10-CM | POA: Diagnosis present

## 2014-07-05 HISTORY — PX: TOTAL KNEE ARTHROPLASTY: SHX125

## 2014-07-05 LAB — GLUCOSE, CAPILLARY: Glucose-Capillary: 95 mg/dL (ref 70–99)

## 2014-07-05 LAB — TYPE AND SCREEN
ABO/RH(D): A NEG
Antibody Screen: NEGATIVE

## 2014-07-05 LAB — ABO/RH: ABO/RH(D): A NEG

## 2014-07-05 SURGERY — ARTHROPLASTY, KNEE, TOTAL
Anesthesia: Spinal | Site: Knee | Laterality: Right

## 2014-07-05 MED ORDER — ALLOPURINOL 300 MG PO TABS
300.0000 mg | ORAL_TABLET | Freq: Every day | ORAL | Status: DC
Start: 2014-07-06 — End: 2014-07-07
  Administered 2014-07-06 – 2014-07-07 (×2): 300 mg via ORAL
  Filled 2014-07-05 (×2): qty 1

## 2014-07-05 MED ORDER — TESTOSTERONE 50 MG/5GM (1%) TD GEL
10.0000 g | Freq: Every day | TRANSDERMAL | Status: DC
  Filled 2014-07-05 (×9): qty 10

## 2014-07-05 MED ORDER — BUPIVACAINE LIPOSOME 1.3 % IJ SUSP
INTRAMUSCULAR | Status: DC | PRN
  Administered 2014-07-05: 20 mL

## 2014-07-05 MED ORDER — MENTHOL 3 MG MT LOZG
1.0000 | LOZENGE | OROMUCOSAL | Status: DC | PRN
  Filled 2014-07-05: qty 9

## 2014-07-05 MED ORDER — CEFAZOLIN SODIUM-DEXTROSE 2-3 GM-% IV SOLR
2.0000 g | INTRAVENOUS | Status: AC
  Administered 2014-07-05: 2 g via INTRAVENOUS

## 2014-07-05 MED ORDER — MORPHINE SULFATE 2 MG/ML IJ SOLN
1.0000 mg | INTRAMUSCULAR | Status: DC | PRN
  Administered 2014-07-06 (×2): 2 mg via INTRAVENOUS
  Filled 2014-07-05 (×2): qty 1

## 2014-07-05 MED ORDER — ROSUVASTATIN CALCIUM 10 MG PO TABS
10.0000 mg | ORAL_TABLET | Freq: Every day | ORAL | Status: DC
Start: 2014-07-06 — End: 2014-07-07
  Administered 2014-07-06 – 2014-07-07 (×2): 10 mg via ORAL
  Filled 2014-07-05 (×2): qty 1

## 2014-07-05 MED ORDER — HYDROCHLOROTHIAZIDE 25 MG PO TABS
25.0000 mg | ORAL_TABLET | Freq: Every day | ORAL | Status: DC
  Administered 2014-07-06 – 2014-07-07 (×2): 25 mg via ORAL
  Filled 2014-07-05 (×2): qty 1

## 2014-07-05 MED ORDER — PROPOFOL 10 MG/ML IV BOLUS
INTRAVENOUS | Status: AC
  Filled 2014-07-05: qty 20

## 2014-07-05 MED ORDER — SODIUM CHLORIDE 0.9 % IR SOLN
Status: DC | PRN
  Administered 2014-07-05: 1000 mL

## 2014-07-05 MED ORDER — ONDANSETRON HCL 4 MG/2ML IJ SOLN
INTRAMUSCULAR | Status: AC
  Filled 2014-07-05: qty 2

## 2014-07-05 MED ORDER — CEFAZOLIN SODIUM-DEXTROSE 2-3 GM-% IV SOLR
INTRAVENOUS | Status: AC
  Filled 2014-07-05: qty 50

## 2014-07-05 MED ORDER — FLEET ENEMA 7-19 GM/118ML RE ENEM: 1.0000 | ENEMA | Freq: Once | RECTAL | Status: AC | PRN

## 2014-07-05 MED ORDER — CEFAZOLIN SODIUM-DEXTROSE 2-3 GM-% IV SOLR
2.0000 g | Freq: Four times a day (QID) | INTRAVENOUS | Status: AC
  Administered 2014-07-05 (×2): 2 g via INTRAVENOUS
  Filled 2014-07-05 (×2): qty 50

## 2014-07-05 MED ORDER — DOCUSATE SODIUM 100 MG PO CAPS
100.0000 mg | ORAL_CAPSULE | Freq: Two times a day (BID) | ORAL | Status: DC
  Administered 2014-07-05 – 2014-07-07 (×4): 100 mg via ORAL

## 2014-07-05 MED ORDER — BISACODYL 10 MG RE SUPP: 10.0000 mg | Freq: Every day | RECTAL | Status: DC | PRN

## 2014-07-05 MED ORDER — BUPIVACAINE LIPOSOME 1.3 % IJ SUSP
20.0000 mL | Freq: Once | INTRAMUSCULAR | Status: DC
  Filled 2014-07-05: qty 20

## 2014-07-05 MED ORDER — TRANEXAMIC ACID 100 MG/ML IV SOLN
1000.0000 mg | INTRAVENOUS | Status: AC
  Administered 2014-07-05: 1000 mg via INTRAVENOUS
  Filled 2014-07-05: qty 10

## 2014-07-05 MED ORDER — ACETAMINOPHEN 500 MG PO TABS
1000.0000 mg | ORAL_TABLET | Freq: Four times a day (QID) | ORAL | Status: AC
  Administered 2014-07-05 – 2014-07-06 (×4): 1000 mg via ORAL
  Filled 2014-07-05 (×4): qty 2

## 2014-07-05 MED ORDER — LIDOCAINE HCL (CARDIAC) 20 MG/ML IV SOLN
INTRAVENOUS | Status: AC
  Filled 2014-07-05: qty 5

## 2014-07-05 MED ORDER — OXYCODONE HCL 5 MG PO TABS
5.0000 mg | ORAL_TABLET | ORAL | Status: DC | PRN
  Administered 2014-07-05 – 2014-07-07 (×7): 10 mg via ORAL
  Filled 2014-07-05 (×7): qty 2

## 2014-07-05 MED ORDER — DEXTROSE 5 % IV SOLN
500.0000 mg | Freq: Four times a day (QID) | INTRAVENOUS | Status: DC | PRN
  Administered 2014-07-05: 500 mg via INTRAVENOUS
  Filled 2014-07-05 (×2): qty 5

## 2014-07-05 MED ORDER — POLYETHYLENE GLYCOL 3350 17 G PO PACK: 17.0000 g | PACK | Freq: Every day | ORAL | Status: DC | PRN

## 2014-07-05 MED ORDER — BUPIVACAINE HCL 0.25 % IJ SOLN
INTRAMUSCULAR | Status: DC | PRN
  Administered 2014-07-05: 20 mL

## 2014-07-05 MED ORDER — FENTANYL CITRATE 0.05 MG/ML IJ SOLN
INTRAMUSCULAR | Status: AC
  Filled 2014-07-05: qty 2

## 2014-07-05 MED ORDER — SODIUM CHLORIDE 0.9 % IJ SOLN
INTRAMUSCULAR | Status: DC | PRN
  Administered 2014-07-05: 30 mL

## 2014-07-05 MED ORDER — TRAMADOL HCL 50 MG PO TABS: 50.0000 mg | ORAL_TABLET | Freq: Four times a day (QID) | ORAL | Status: DC | PRN

## 2014-07-05 MED ORDER — ACETAMINOPHEN 650 MG RE SUPP: 650.0000 mg | Freq: Four times a day (QID) | RECTAL | Status: DC | PRN

## 2014-07-05 MED ORDER — METOCLOPRAMIDE HCL 10 MG PO TABS: 5.0000 mg | ORAL_TABLET | Freq: Three times a day (TID) | ORAL | Status: DC | PRN

## 2014-07-05 MED ORDER — METHOCARBAMOL 500 MG PO TABS
500.0000 mg | ORAL_TABLET | Freq: Four times a day (QID) | ORAL | Status: DC | PRN
Start: 2014-07-05 — End: 2014-07-07
  Administered 2014-07-05 – 2014-07-06 (×2): 500 mg via ORAL
  Filled 2014-07-05 (×3): qty 1

## 2014-07-05 MED ORDER — HYDROMORPHONE HCL 1 MG/ML IJ SOLN: 0.2500 mg | INTRAMUSCULAR | Status: DC | PRN

## 2014-07-05 MED ORDER — LEVOTHYROXINE SODIUM 175 MCG PO TABS
175.0000 ug | ORAL_TABLET | Freq: Every day | ORAL | Status: DC
  Administered 2014-07-06 – 2014-07-07 (×2): 175 ug via ORAL
  Filled 2014-07-05 (×3): qty 1

## 2014-07-05 MED ORDER — MIDAZOLAM HCL 2 MG/2ML IJ SOLN
INTRAMUSCULAR | Status: AC
  Filled 2014-07-05: qty 2

## 2014-07-05 MED ORDER — DULOXETINE HCL 60 MG PO CPEP
60.0000 mg | ORAL_CAPSULE | Freq: Every morning | ORAL | Status: DC
  Administered 2014-07-06 – 2014-07-07 (×2): 60 mg via ORAL
  Filled 2014-07-05 (×2): qty 1

## 2014-07-05 MED ORDER — ONDANSETRON HCL 4 MG/2ML IJ SOLN: 4.0000 mg | Freq: Four times a day (QID) | INTRAMUSCULAR | Status: DC | PRN

## 2014-07-05 MED ORDER — DEXAMETHASONE SODIUM PHOSPHATE 10 MG/ML IJ SOLN
10.0000 mg | Freq: Once | INTRAMUSCULAR | Status: AC
  Administered 2014-07-05: 10 mg via INTRAVENOUS

## 2014-07-05 MED ORDER — DIPHENHYDRAMINE HCL 12.5 MG/5ML PO ELIX: 12.5000 mg | ORAL_SOLUTION | ORAL | Status: DC | PRN

## 2014-07-05 MED ORDER — ONDANSETRON HCL 4 MG/2ML IJ SOLN
INTRAMUSCULAR | Status: DC | PRN
  Administered 2014-07-05: 4 mg via INTRAVENOUS

## 2014-07-05 MED ORDER — PROPOFOL INFUSION 10 MG/ML OPTIME
INTRAVENOUS | Status: DC | PRN
  Administered 2014-07-05: 100 ug/kg/min via INTRAVENOUS

## 2014-07-05 MED ORDER — SODIUM CHLORIDE 0.9 % IV SOLN: INTRAVENOUS | Status: DC

## 2014-07-05 MED ORDER — METOCLOPRAMIDE HCL 5 MG/ML IJ SOLN: 5.0000 mg | Freq: Three times a day (TID) | INTRAMUSCULAR | Status: DC | PRN

## 2014-07-05 MED ORDER — MIDAZOLAM HCL 5 MG/5ML IJ SOLN
INTRAMUSCULAR | Status: DC | PRN
  Administered 2014-07-05 (×2): 1 mg via INTRAVENOUS

## 2014-07-05 MED ORDER — DEXAMETHASONE SODIUM PHOSPHATE 10 MG/ML IJ SOLN
INTRAMUSCULAR | Status: AC
  Filled 2014-07-05: qty 1

## 2014-07-05 MED ORDER — FAMOTIDINE 20 MG PO TABS
20.0000 mg | ORAL_TABLET | Freq: Every day | ORAL | Status: DC
  Administered 2014-07-06 – 2014-07-07 (×2): 20 mg via ORAL
  Filled 2014-07-05 (×2): qty 1

## 2014-07-05 MED ORDER — LACTATED RINGERS IV SOLN
INTRAVENOUS | Status: DC
  Administered 2014-07-05 (×2): 1000 mL via INTRAVENOUS

## 2014-07-05 MED ORDER — ACETAMINOPHEN 325 MG PO TABS
650.0000 mg | ORAL_TABLET | Freq: Four times a day (QID) | ORAL | Status: DC | PRN
Start: 2014-07-06 — End: 2014-07-07

## 2014-07-05 MED ORDER — CHLORHEXIDINE GLUCONATE 4 % EX LIQD: 60.0000 mL | Freq: Once | CUTANEOUS | Status: DC

## 2014-07-05 MED ORDER — EPHEDRINE SULFATE 50 MG/ML IJ SOLN
INTRAMUSCULAR | Status: DC | PRN
  Administered 2014-07-05: 5 mg via INTRAVENOUS

## 2014-07-05 MED ORDER — SODIUM CHLORIDE 0.9 % IJ SOLN
INTRAMUSCULAR | Status: AC
  Filled 2014-07-05: qty 50

## 2014-07-05 MED ORDER — PROMETHAZINE HCL 25 MG/ML IJ SOLN: 6.2500 mg | INTRAMUSCULAR | Status: DC | PRN

## 2014-07-05 MED ORDER — ACETAMINOPHEN 10 MG/ML IV SOLN
1000.0000 mg | Freq: Once | INTRAVENOUS | Status: AC
  Administered 2014-07-05: 1000 mg via INTRAVENOUS
  Filled 2014-07-05: qty 100

## 2014-07-05 MED ORDER — VALSARTAN-HYDROCHLOROTHIAZIDE 320-25 MG PO TABS: 1.0000 | ORAL_TABLET | Freq: Every morning | ORAL | Status: DC

## 2014-07-05 MED ORDER — BUPIVACAINE HCL (PF) 0.25 % IJ SOLN
INTRAMUSCULAR | Status: AC
  Filled 2014-07-05: qty 30

## 2014-07-05 MED ORDER — FENTANYL CITRATE 0.05 MG/ML IJ SOLN
INTRAMUSCULAR | Status: DC | PRN
  Administered 2014-07-05: 50 ug via INTRAVENOUS

## 2014-07-05 MED ORDER — SODIUM CHLORIDE 0.9 % IV SOLN
INTRAVENOUS | Status: DC
  Administered 2014-07-05: 16:00:00 via INTRAVENOUS

## 2014-07-05 MED ORDER — ONDANSETRON HCL 4 MG PO TABS: 4.0000 mg | ORAL_TABLET | Freq: Four times a day (QID) | ORAL | Status: DC | PRN

## 2014-07-05 MED ORDER — LACTATED RINGERS IV SOLN
INTRAVENOUS | Status: DC | PRN
  Administered 2014-07-05 (×2): via INTRAVENOUS

## 2014-07-05 MED ORDER — RIVAROXABAN 10 MG PO TABS
10.0000 mg | ORAL_TABLET | Freq: Every day | ORAL | Status: DC
  Administered 2014-07-06 – 2014-07-07 (×2): 10 mg via ORAL
  Filled 2014-07-05 (×3): qty 1

## 2014-07-05 MED ORDER — DEXAMETHASONE SODIUM PHOSPHATE 10 MG/ML IJ SOLN
10.0000 mg | Freq: Once | INTRAMUSCULAR | Status: AC
  Administered 2014-07-06: 10 mg via INTRAVENOUS
  Filled 2014-07-05: qty 1

## 2014-07-05 MED ORDER — PHENOL 1.4 % MT LIQD: 1.0000 | OROMUCOSAL | Status: DC | PRN

## 2014-07-05 MED ORDER — KETOROLAC TROMETHAMINE 15 MG/ML IJ SOLN: 7.5000 mg | Freq: Four times a day (QID) | INTRAMUSCULAR | Status: AC | PRN

## 2014-07-05 MED ORDER — IRBESARTAN 300 MG PO TABS
300.0000 mg | ORAL_TABLET | Freq: Every day | ORAL | Status: DC
  Administered 2014-07-06 – 2014-07-07 (×2): 300 mg via ORAL
  Filled 2014-07-05 (×2): qty 1

## 2014-07-05 SURGICAL SUPPLY — 62 items
BAG SPEC THK2 15X12 ZIP CLS (MISCELLANEOUS) ×1
BAG ZIPLOCK 12X15 (MISCELLANEOUS) ×2 IMPLANT
BANDAGE ELASTIC 6 VELCRO ST LF (GAUZE/BANDAGES/DRESSINGS) ×2 IMPLANT
BANDAGE ESMARK 6X9 LF (GAUZE/BANDAGES/DRESSINGS) ×1 IMPLANT
BLADE SAG 18X100X1.27 (BLADE) ×2 IMPLANT
BLADE SAW SGTL 11.0X1.19X90.0M (BLADE) ×2 IMPLANT
BNDG CMPR 9X6 STRL LF SNTH (GAUZE/BANDAGES/DRESSINGS) ×1
BNDG ESMARK 6X9 LF (GAUZE/BANDAGES/DRESSINGS) ×2
BOWL SMART MIX CTS (DISPOSABLE) ×2 IMPLANT
CAPT KNEE TOTAL 3 ATTUNE ×1 IMPLANT
CEMENT HV SMART SET (Cement) ×4 IMPLANT
CUFF TOURN SGL QUICK 34 (TOURNIQUET CUFF) ×2
CUFF TRNQT CYL 34X4X40X1 (TOURNIQUET CUFF) ×1 IMPLANT
DECANTER SPIKE VIAL GLASS SM (MISCELLANEOUS) ×2 IMPLANT
DRAPE EXTREMITY T 121X128X90 (DRAPE) ×2 IMPLANT
DRAPE POUCH INSTRU U-SHP 10X18 (DRAPES) ×2 IMPLANT
DRAPE U-SHAPE 47X51 STRL (DRAPES) ×2 IMPLANT
DRSG ADAPTIC 3X8 NADH LF (GAUZE/BANDAGES/DRESSINGS) ×2 IMPLANT
DRSG PAD ABDOMINAL 8X10 ST (GAUZE/BANDAGES/DRESSINGS) ×1 IMPLANT
DURAPREP 26ML APPLICATOR (WOUND CARE) ×2 IMPLANT
ELECT REM PT RETURN 9FT ADLT (ELECTROSURGICAL) ×2
ELECTRODE REM PT RTRN 9FT ADLT (ELECTROSURGICAL) ×1 IMPLANT
EVACUATOR 1/8 PVC DRAIN (DRAIN) ×2 IMPLANT
FACESHIELD WRAPAROUND (MASK) ×10 IMPLANT
FACESHIELD WRAPAROUND OR TEAM (MASK) ×5 IMPLANT
GAUZE SPONGE 4X4 12PLY STRL (GAUZE/BANDAGES/DRESSINGS) ×2 IMPLANT
GLOVE BIO SURGEON STRL SZ7.5 (GLOVE) IMPLANT
GLOVE BIO SURGEON STRL SZ8 (GLOVE) ×2 IMPLANT
GLOVE BIOGEL PI IND STRL 6.5 (GLOVE) IMPLANT
GLOVE BIOGEL PI IND STRL 8 (GLOVE) ×1 IMPLANT
GLOVE BIOGEL PI INDICATOR 6.5 (GLOVE)
GLOVE BIOGEL PI INDICATOR 8 (GLOVE) ×1
GLOVE SURG SS PI 6.5 STRL IVOR (GLOVE) IMPLANT
GOWN STRL REUS W/TWL LRG LVL3 (GOWN DISPOSABLE) ×2 IMPLANT
GOWN STRL REUS W/TWL XL LVL3 (GOWN DISPOSABLE) IMPLANT
HANDPIECE INTERPULSE COAX TIP (DISPOSABLE) ×2
IMMOBILIZER KNEE 20 (SOFTGOODS) ×2
IMMOBILIZER KNEE 20 THIGH 36 (SOFTGOODS) ×1 IMPLANT
KIT BASIN OR (CUSTOM PROCEDURE TRAY) ×2 IMPLANT
MANIFOLD NEPTUNE II (INSTRUMENTS) ×2 IMPLANT
NDL SAFETY ECLIPSE 18X1.5 (NEEDLE) ×2 IMPLANT
NEEDLE HYPO 18GX1.5 SHARP (NEEDLE) ×4
NS IRRIG 1000ML POUR BTL (IV SOLUTION) ×2 IMPLANT
PACK TOTAL JOINT (CUSTOM PROCEDURE TRAY) ×2 IMPLANT
PAD ABD 8X10 STRL (GAUZE/BANDAGES/DRESSINGS) ×1 IMPLANT
PADDING CAST COTTON 6X4 STRL (CAST SUPPLIES) ×4 IMPLANT
POSITIONER SURGICAL ARM (MISCELLANEOUS) ×2 IMPLANT
SET HNDPC FAN SPRY TIP SCT (DISPOSABLE) ×1 IMPLANT
STRIP CLOSURE SKIN 1/2X4 (GAUZE/BANDAGES/DRESSINGS) ×3 IMPLANT
SUCTION FRAZIER 12FR DISP (SUCTIONS) ×2 IMPLANT
SUT MNCRL AB 4-0 PS2 18 (SUTURE) ×2 IMPLANT
SUT VIC AB 2-0 CT1 27 (SUTURE) ×6
SUT VIC AB 2-0 CT1 TAPERPNT 27 (SUTURE) ×3 IMPLANT
SUT VLOC 180 0 24IN GS25 (SUTURE) ×2 IMPLANT
SYR 20CC LL (SYRINGE) ×2 IMPLANT
SYR 50ML LL SCALE MARK (SYRINGE) ×2 IMPLANT
TOWEL OR 17X26 10 PK STRL BLUE (TOWEL DISPOSABLE) ×2 IMPLANT
TOWEL OR NON WOVEN STRL DISP B (DISPOSABLE) IMPLANT
TRAY FOLEY CATH 14FRSI W/METER (CATHETERS) ×1 IMPLANT
TRAY FOLEY CATH 16FRSI W/METER (SET/KITS/TRAYS/PACK) ×1 IMPLANT
WATER STERILE IRR 1500ML POUR (IV SOLUTION) ×2 IMPLANT
WRAP KNEE MAXI GEL POST OP (GAUZE/BANDAGES/DRESSINGS) ×2 IMPLANT

## 2014-07-05 NOTE — Anesthesia Postprocedure Evaluation (Signed)
  Anesthesia Post-op Note  Patient: Howard [name]  Procedure(s) Performed: Procedure(s) (LRB): RIGHT TOTAL KNEE ARTHROPLASTY (Right)  Patient Location: PACU  Anesthesia Type: Spinal  Level of Consciousness: awake and alert   Airway and Oxygen Therapy: Patient Spontanous Breathing  Post-op Pain: mild  Post-op Assessment: Post-op Vital signs reviewed, Patient's Cardiovascular Status Stable, Respiratory Function Stable, Patent Airway and No signs of Nausea or vomiting  Last Vitals:  Filed Vitals:   07/05/14 1411  BP: 103/61  Pulse: 67  Temp:   Resp: 6    Post-op Vital Signs: stable   Complications: No apparent anesthesia complications

## 2014-07-05 NOTE — Interval H&P Note (Signed)
History and Physical Interval Note:  07/05/2014 11:28 AM  Howard [name]  has presented today for surgery, with the diagnosis of OA RIGHT KNEE   The various methods of treatment have been discussed with the patient and family. After consideration of risks, benefits and other options for treatment, the patient has consented to  Procedure(s): RIGHT TOTAL KNEE ARTHROPLASTY (Right) as a surgical intervention .  The patient's history has been reviewed, patient examined, no change in status, stable for surgery.  I have reviewed the patient's chart and labs.  Questions were answered to the patient's satisfaction.     Gearlean Alf

## 2014-07-05 NOTE — Op Note (Signed)
Pre-operative diagnosis- Osteoarthritis  Right knee(s)  Post-operative diagnosis- Osteoarthritis Right knee(s)  Procedure-  Right  Total Knee Arthroplasty  Surgeon- Dione Plover. Joaquina Nissen, MD  Assistant- Arlee Muslim, PA-C   Anesthesia-  Spinal  EBL-* No blood loss amount entered *   Drains Hemovac  Tourniquet time- 43 minutes @ 510 mm Hg  Complications- None  Condition-PACU - hemodynamically stable.   Brief Clinical Note  Howard [name] is a 59 y.o. year old male with end stage OA of his right knee with progressively worsening pain and dysfunction. He has constant pain, with activity and at rest and significant functional deficits with difficulties even with ADLs. He has had extensive non-op management including analgesics, injections of cortisone and viscosupplements, and home exercise program, but remains in significant pain with significant dysfunction. Radiographs show tricompartmental bone on bone changes with tibial subluxation. He presents now for right Total Knee Arthroplasty.    Procedure in detail---   The patient is brought into the operating room and positioned supine on the operating table. After successful administration of  Spinal,   a tourniquet is placed high on the  Right thigh(s) and the lower extremity is prepped and draped in the usual sterile fashion. Time out is performed by the operating team and then the  Right lower extremity is wrapped in Esmarch, knee flexed and the tourniquet inflated to 300 mmHg.       A midline incision is made with a ten blade through the subcutaneous tissue to the level of the extensor mechanism. A fresh blade is used to make a medial parapatellar arthrotomy. Soft tissue over the proximal medial tibia is subperiosteally elevated to the joint line with a knife and into the semimembranosus bursa with a Cobb elevator. Soft tissue over the proximal lateral tibia is elevated with attention being paid to avoiding the patellar tendon on the tibial  tubercle. The patella is everted, knee flexed 90 degrees and the ACL and PCL are removed. Findings are bone on bone all 3 compartments with massive global osteophytes.        The drill is used to create a starting hole in the distal femur and the canal is thoroughly irrigated with sterile saline to remove the fatty contents. The 5 degree Right  valgus alignment guide is placed into the femoral canal and the distal femoral cutting block is pinned to remove 10 mm off the distal femur. Resection is made with an oscillating saw.      The tibia is subluxed forward and the menisci are removed. The extramedullary alignment guide is placed referencing proximally at the medial aspect of the tibial tubercle and distally along the second metatarsal axis and tibial crest. The block is pinned to remove 79mm off the more deficient medial  side. Resection is made with an oscillating saw. Size 9is the most appropriate size for the tibia and the proximal tibia is prepared with the modular drill and keel punch for that size.      The femoral sizing guide is placed and size 8 is most appropriate. Rotation is marked off the epicondylar axis and confirmed by creating a rectangular flexion gap at 90 degrees. The size 8 cutting block is pinned in this rotation and the anterior, posterior and chamfer cuts are made with the oscillating saw. The intercondylar block is then placed and that cut is made.      Trial size 9 tibial component, trial size 8 posterior stabilized femur and a 7  mm posterior  stabilized rotating platform insert trial is placed. Full extension is achieved with excellent varus/valgus and anterior/posterior balance throughout full range of motion. The patella is everted and thickness measured to be 27  mm. Free hand resection is taken to 15 mm, a 41 template is placed, lug holes are drilled, trial patella is placed, and it tracks normally. Osteophytes are removed off the posterior femur with the trial in place. All  trials are removed and the cut bone surfaces prepared with pulsatile lavage. Cement is mixed and once ready for implantation, the size 9 tibial implant, size  8 posterior stabilized femoral component, and the size 41 patella are cemented in place and the patella is held with the clamp. The trial insert is placed and the knee held in full extension. The Exparel (20 ml mixed with 30 ml saline) and .25% Bupivicaine, are injected into the extensor mechanism, posterior capsule, medial and lateral gutters and subcutaneous tissues.  All extruded cement is removed and once the cement is hard the permanent 7 mm posterior stabilized rotating platform insert is placed into the tibial tray.      The wound is copiously irrigated with saline solution and the extensor mechanism closed over a hemovac drain with #1 V-loc suture. The tourniquet is released for a total tourniquet time of 43  minutes. Flexion against gravity is 140 degrees and the patella tracks normally. Subcutaneous tissue is closed with 2.0 vicryl and subcuticular with running 4.0 Monocryl. The incision is cleaned and dried and steri-strips and a bulky sterile dressing are applied. The limb is placed into a knee immobilizer and the patient is awakened and transported to recovery in stable condition.      Please note that a surgical assistant was a medical necessity for this procedure in order to perform it in a safe and expeditious manner. Surgical assistant was necessary to retract the ligaments and vital neurovascular structures to prevent injury to them and also necessary for proper positioning of the limb to allow for anatomic placement of the prosthesis.   Dione Plover Starling Christofferson, MD    07/05/2014, 1:33 PM

## 2014-07-05 NOTE — Anesthesia Procedure Notes (Signed)
Spinal  Start time: 07/05/2014 12:25 PM End time: 07/05/2014 12:29 PM Staffing Resident/CRNA: Yoland Scherr G Performed by: resident/CRNA  Preanesthetic Checklist Completed: patient identified, site marked, surgical consent, pre-op evaluation, timeout performed, IV checked, risks and benefits discussed and monitors and equipment checked Spinal Block Patient position: sitting Prep: Betadine Patient monitoring: heart rate, continuous pulse ox and blood pressure Approach: midline Location: L2-3 Injection technique: single-shot Needle Needle type: Sprotte  Needle gauge: 24 G Needle length: 9 cm Needle insertion depth: 6 cm Assessment Sensory level: T6

## 2014-07-05 NOTE — Anesthesia Preprocedure Evaluation (Signed)
Anesthesia Evaluation  Patient identified by MRN, date of birth, ID band Patient awake    Reviewed: Allergy & Precautions, NPO status , Patient's Chart, lab work & pertinent test results  Airway Mallampati: II  TM Distance: <3 FB Neck ROM: Full    Dental no notable dental hx.    Pulmonary sleep apnea , former smoker,  breath sounds clear to auscultation  Pulmonary exam normal       Cardiovascular hypertension, Pt. on medications Rhythm:Regular Rate:Normal     Neuro/Psych negative neurological ROS  negative psych ROS   GI/Hepatic negative GI ROS, Neg liver ROS,   Endo/Other  diabetesHypothyroidism   Renal/GU negative Renal ROS  negative genitourinary   Musculoskeletal negative musculoskeletal ROS (+)   Abdominal   Peds negative pediatric ROS (+)  Hematology negative hematology ROS (+)   Anesthesia Other Findings   Reproductive/Obstetrics negative OB ROS                             Anesthesia Physical Anesthesia Plan  ASA: [name]  Anesthesia Plan: Spinal   Post-op Pain Management:    Induction: Intravenous  Airway Management Planned: Simple Face Mask  Additional Equipment:   Intra-op Plan:   Post-operative Plan:   Informed Consent: I have reviewed the patients History and Physical, chart, labs and discussed the procedure including the risks, benefits and alternatives for the proposed anesthesia with the patient or authorized representative who has indicated his/her understanding and acceptance.   Dental advisory given  Plan Discussed with: CRNA and Surgeon  Anesthesia Plan Comments:         Anesthesia Quick Evaluation

## 2014-07-05 NOTE — Transfer of Care (Signed)
Immediate Anesthesia Transfer of Care Note  Patient: Howard [name]  Procedure(s) Performed: Procedure(s): RIGHT TOTAL KNEE ARTHROPLASTY (Right)  Patient Location: PACU  Anesthesia Type:Spinal  Level of Consciousness: awake, alert  and oriented  Airway & Oxygen Therapy: Patient Spontanous Breathing and Patient connected to face mask oxygen  Post-op Assessment: Report given to PACU RN and Post -op Vital signs reviewed and stable  Post vital signs: Reviewed and stable  Complications: No apparent anesthesia complications

## 2014-07-05 NOTE — H&P (View-Only) (Signed)
Howard [name] DOB: 03/08/1956 Married / Language: English / Race: White Male Date of Admission:  07/05/2014 CC:  Right Knee Pain History of Present Illness (Howard L. Perkins [name] PA-C; 06/07/2014 12:08 PM) The patient is a 59 year old male who comes in for a preoperative History and Physical. The patient is scheduled for a right total knee arthroplasty to be performed by Dr. Dione Plover. Aluisio, MD at [facility] on 07-05-2014. The patient is being followed for their right knee pain and osteoarthritis. They are now out from a cortisone injection. Symptoms reported today include: pain and swelling. The patient feels that they are doing well and report their pain level to be mild to moderate. The following medication has been used for pain control: antiinflammatory medication (Celebrex). The patient has reported improvement of their symptoms with: Cortisone injections. The pain had gotten progressively worse. He had an aspiration and cortisone injection a couple of months ago and the swelling came back quickly. He was able [place] for [place] and tolerated it well. The knee has gotten to the point where it is affecting everything he does. It is limiting what he can and cannot do. He does have pain even at night. He is ready to proceed with surgery at this time.  They have been treated conservatively in the past for the above stated problem and despite conservative measures, they continue to have progressive pain and severe functional limitations and dysfunction. They have failed non-operative management including home exercise, medications, and injections. It is felt that they would benefit from undergoing total joint replacement. Risks and benefits of the procedure have been discussed with the patient and they elect to proceed with surgery. There are no active contraindications to surgery such as ongoing infection or rapidly progressive neurological disease.  Problem List/Past  Medical (Howard Monika Salk, [name] PA-C; 06/07/2014 12:09 PM) S/P arthroscopy of knee (V45.89) Primary osteoarthritis of right knee (M17.11) Carpal tunnel syndrome (G56.00) Chronic Pain Hypercholesterolemia Gout High blood pressure Skin Cancer Basal Cell Hypothyroidism Osteoarthritis Sleep Apnea Gastroesophageal Reflux Disease mild Degenerative Disc Disease  Allergies Sulfanilamide  Rash.  Family History (Howard Monika Salk, [name] PA-C; 06/07/2014 9:26 AM) Osteoarthritis mother and father Kidney disease father Hypertension father Osteoporosis father Heart disease in male family member before age 62 Cerebrovascular Accident mother Heart Disease father Diabetes Mellitus father Cancer father Congestive Heart Failure father  Social History (Howard Monika Salk, [name] PA-C; 06/07/2014 9:26 AM) Tobacco / smoke exposure no Tobacco use Former smoker. former smoker; smoke(d) 1 pack(s) per day Living situation live with spouse Alcohol use Occasional alcohol use. current drinker; drinks beer; 5-7 per week Number of flights of stairs before winded greater than 5 Pain Contract yes Marital status married Children 2 Copy of Drug/Alcohol Rehab (Previously) no Current work status working part time Drug/Alcohol Rehab (Currently) no Exercise Exercises weekly; does other Illicit drug use no  Medication History (Howard L Perkins, [name] PA-C; 06/07/2014 9:26 AM) Voltaren (1% Gel, Transdermal) Active. Butrans (10MCG/HR Patch Weekly, Transdermal) Active. CeleBREX (200MG  Capsule, Oral) Active. Ketamine HCl Active. Fish Oil Active. Allopurinol (300MG  Tablet, Oral) Active. Cymbalta (60MG  Capsule DR Part, Oral) Active. Diovan HCT (320-12.5MG  Tablet, Oral) Active. Testim (50MG /5GM Gel, Transdermal) Active. Crestor (10MG  Tablet, Oral) Active. Synthroid (175MCG Tablet, Oral) Active. Vitamin D (400UNIT Capsule, Oral) Active. Calcium 600  (1500MG  Tablet, Oral) Active. Ranitidine HCl (150MG  Tablet, Oral) Active. Flax Seed Oil (1000MG  Capsule, Oral) Active.  Past Surgical History (Howard Monika Salk, [name] PA-C; 06/07/2014  12:10 PM) Anal Fissure Repair Rotator Cuff Repair Date: 11/2010. right Arthroscopy of Knee bilateral Arthroscopy of Shoulder right Foot Surgery left   Review of Systems (Howard L. Perkins [name] PA-C; 06/07/2014 12:11 PM) General Not Present- Chills, Fatigue, Fever, Memory Loss, Night Sweats, Weight Gain and Weight Loss. Skin Not Present- Eczema, Hives, Itching, Lesions and Rash. HEENT Not Present- Dentures, Double Vision, Headache, Hearing Loss, Tinnitus and Visual Loss. Respiratory Not Present- Allergies, Chronic Cough, Coughing up blood, Shortness of breath at rest and Shortness of breath with exertion. Cardiovascular Not Present- Chest Pain, Difficulty Breathing Lying Down, Murmur, Palpitations, Racing/skipping heartbeats and Swelling. Gastrointestinal Not Present- Abdominal Pain, Bloody Stool, Constipation, Diarrhea, Difficulty Swallowing, Heartburn, Jaundice, Loss of appetitie, Nausea and Vomiting. Male Genitourinary Present- Urinating at Night. Not Present- Blood in Urine, Discharge, Flank Pain, Incontinence, Painful Urination, Urgency, Urinary frequency, Urinary Retention and Weak urinary stream. Musculoskeletal Present- Joint Pain and Joint Swelling. Not Present- Back Pain, Morning Stiffness, Muscle Pain, Muscle Weakness and Spasms. Neurological Not Present- Blackout spells, Difficulty with balance, Dizziness, Paralysis, Tremor and Weakness. Psychiatric Not Present- Insomnia.   Vitals (Howard L. Perkins [name] PA-C; 06/07/2014 9:33 AM) 06/07/2014 9:31 AM Weight: 245 lb Height: 74in Weight was reported by patient. Height was reported by patient. Body Surface Area: 2.37 m Body Mass Index: 31.46 kg/m  BP: 130/82 (Sitting, Right Arm, Standard)   Physical Exam (Howard L.  Perkins [name] PA-C; 06/07/2014 9:21 AM) General Mental Status -Alert, cooperative and good historian. General Appearance-pleasant, Not in acute distress. Orientation-Oriented X3. Build & Nutrition-Well nourished and Well developed.  Head and Neck Head-normocephalic, atraumatic . Neck Global Assessment - supple, no bruit auscultated on the right, no bruit auscultated on the left.  Eye Pupil - Bilateral-Regular and Round. Motion - Bilateral-EOMI.  Chest and Lung Exam Auscultation Breath sounds - clear at anterior chest wall and clear at posterior chest wall. Adventitious sounds - No Adventitious sounds.  Cardiovascular Auscultation Rhythm - Regular rate and rhythm. Heart Sounds - S1 WNL and S2 WNL. Murmurs & Other Heart Sounds - Auscultation of the heart reveals - No Murmurs.  Abdomen Palpation/Percussion Tenderness - Abdomen is non-tender to palpation. Rigidity (guarding) - Abdomen is soft. Auscultation Auscultation of the abdomen reveals - Bowel sounds normal.  Male Genitourinary Note: Not done, not pertinent to present illness   Musculoskeletal Note: On exam well developed male in no distress. The right knee moderate effusion. There is marked crepitus on range of motion, range 10 to 120. He is tender medial and lateral. There is no instability noted. Pulse sensation and motor intact distally.   Assessment & Plan (Howard L. Perkins [name] PA-C; 06/07/2014 9:37 AM) Primary osteoarthritis of right knee (M17.11) Current Plans Pt Education - Aluisio - Postoperative Constipation Education: discussed with patient and provided information. Pt Education - [place] Procedure Education: discussed with patient and provided information. Note:Plan is for a Right Total Knee Replacement by Dr. Wynelle Link.  Plan is to go home.  PCP - Dr. Crist Infante - Patient has been seen preoperatively and felt to be stable for surgery.  The patient does not have any  contraindications and will receive TXA (tranexamic acid) prior to surgery.  Signed electronically by Joelene Millin, [name] PA-C

## 2014-07-05 NOTE — Plan of Care (Signed)
Problem: Consults Goal: Diagnosis- Total Joint Replacement Right total knee     

## 2014-07-06 ENCOUNTER — Encounter (HOSPITAL_COMMUNITY): Payer: Self-pay | Admitting: Orthopedic Surgery

## 2014-07-06 LAB — BASIC METABOLIC PANEL
ANION GAP: 7 (ref 5–15)
BUN: 12 mg/dL (ref 6–23)
CALCIUM: 8.5 mg/dL (ref 8.4–10.5)
CO2: 27 mmol/L (ref 19–32)
Chloride: 103 mEq/L (ref 96–112)
Creatinine, Ser: 0.71 mg/dL (ref 0.50–1.35)
GFR calc non Af Amer: >90 mL/min (ref >90)
GLUCOSE: 140 mg/dL — AB (ref 70–99)
POTASSIUM: 4.1 mmol/L (ref 3.5–5.1)
Sodium: 137 mmol/L (ref 135–145)

## 2014-07-06 LAB — CBC
HCT: 34.2 % — ABNORMAL LOW (ref 39.0–52.0)
HEMOGLOBIN: 11.7 g/dL — AB (ref 13.0–17.0)
MCH: 31.5 pg (ref 26.0–34.0)
MCHC: 34.2 g/dL (ref 30.0–36.0)
MCV: 91.9 fL (ref 78.0–100.0)
PLATELETS: 226 10*3/uL (ref 150–400)
RBC: 3.72 MIL/uL — AB (ref 4.22–5.81)
RDW: 12.6 % (ref 11.5–15.5)
WBC: 13.1 10*3/uL — ABNORMAL HIGH (ref 4.0–10.5)

## 2014-07-06 NOTE — Care Management Note (Signed)
Page 1 of 2   07/06/2014     12:25:46 PM CARE MANAGEMENT NOTE 07/06/2014  Patient:  LABRON, BLOODGOOD   Account Number:  [id number]  Date Initiated:  07/06/2014  Documentation initiated by:  [facility]  Subjective/Objective Assessment:   adm: RIGHT TOTAL KNEE ARTHROPLASTY (Right)     Action/Plan:   discahrge planning   Anticipated DC Date:  07/07/2014   Anticipated DC Plan:  [place]  CM consult      [facility] Choice  HOME HEALTH   Choice offered to / List presented to:  C-1 Patient   DME arranged  NA      DME agency  NA     [place] arranged  HH-2 PT      [place]   Status of service:  Completed, signed off Medicare Important Message given?   (If response is "NO", the following Medicare IM given date fields will be blank) Date Medicare IM given:   Medicare IM given by:   Date Additional Medicare IM given:   Additional Medicare IM given by:    Discharge Disposition:  [place]  Per UR Regulation:  Reviewed for med. necessity/level of care/duration of stay  If discussed at [place] of Stay Meetings, dates discussed:    Comments:  07/06/14 12:20 Cm met with pt in roomto offer choice of home health agency.  Pt chooses [facility] to render HHPT.  Pt states he has both a rolling walker and 3n1 at home. Address and contact information verified by pt with primary contact [phone].  Referral given to [facility] rep, Tim with appropriate contact number.  No other CM needs were communicated.  Mariane Masters, BSn, Cm [phone].

## 2014-07-06 NOTE — Progress Notes (Signed)
   Subjective: 1 Day Post-Op Procedure(s) (LRB): RIGHT TOTAL KNEE ARTHROPLASTY (Right) Patient reports pain as mild.   Patient seen in rounds with Dr. Wynelle Link.  Doing okay.  Wife in room at bedside. Patient is well, but has had some minor complaints of pain in the knee, requiring pain medications.  Not much sleep last night. Already doing SLR's.  He had a large amount of swelling in knee just prior to surgery.  Anticipate continued swelling and drainage so will leave in hemovac drain until tomorrow. We will start therapy today.  Plan is to go Home after hospital stay.  Objective: Vital signs in last 24 hours: Temp:  [97.3 F (36.3 C)-98.7 F (37.1 C)] 97.9 F (36.6 C) (01/19 0530) Pulse Rate:  [62-86] 86 (01/19 0530) Resp:  [6-18] 16 (01/19 0530) BP: (103-142)/(61-84) 122/76 mmHg (01/19 0530) SpO2:  [93 %-100 %] 96 % (01/19 0530) Weight:  [114.76 kg (253 lb)] 114.76 kg (253 lb) (01/18 1019)  Intake/Output from previous day:  Intake/Output Summary (Last 24 hours) at 07/06/14 0834 Last data filed at 07/06/14 0630  Gross per 24 hour  Intake   6810 ml  Output   3670 ml  Net   3140 ml    Intake/Output this shift: UOP 1500 since MN  Labs:  Recent Labs  07/06/14 0435  HGB 11.7*    Recent Labs  07/06/14 0435  WBC 13.1*  RBC 3.72*  HCT 34.2*  PLT 226    Recent Labs  07/06/14 0435  NA 137  K 4.1  CL 103  CO2 27  BUN 12  CREATININE 0.71  GLUCOSE 140*  CALCIUM 8.5   No results for input(s): LABPT, INR in the last 72 hours.  EXAM General - Patient is Alert, Appropriate and Oriented Extremity - Neurovascular intact Sensation intact distally Dorsiflexion/Plantar flexion intact Dressing - dressing C/D/I Motor Function - intact, moving foot and toes well on exam.  Hemovac left in place today. (plan to pull tomorrow)  Past Medical History  Diagnosis Date  . Hypertension   . Elevated cholesterol   . Arthritis   . Esophageal spasm   . Hypothyroidism   .  Cancer     skin cancer removed  . Hypogonadism male   . Sleep apnea     uses c-pap- [facility] USES SETTING IS 10  . GERD (gastroesophageal reflux disease)   . Family history of adverse reaction to anesthesia     FATHER REACTED TO LIDOCAINE   . Elevated uric acid in blood   . History of nonmelanoma skin cancer   . Anxiety   . Bilateral shoulder pain   . Borderline diabetes     Assessment/Plan: 1 Day Post-Op Procedure(s) (LRB): RIGHT TOTAL KNEE ARTHROPLASTY (Right) Principal Problem:   OA (osteoarthritis) of knee  Estimated body mass index is 32.47 kg/(m^2) as calculated from the following:   Height as of this encounter: 6\' 2"  (1.88 m).   Weight as of this encounter: 114.76 kg (253 lb). Advance diet Up with therapy Plan for discharge tomorrow Discharge home with home health  DVT Prophylaxis - Xarelto Weight-Bearing as tolerated to right leg D/C O2 and Pulse OX and try on Room Air  Arlee Muslim, PA-C Orthopaedic Surgery 07/06/2014, 8:34 AM

## 2014-07-06 NOTE — Evaluation (Signed)
Physical Therapy Evaluation Patient Details Name: Howard [name] MRN: [id number] DOB: May 22, 1956 Today's Date: 07/06/2014   History of Present Illness  Pt is a 59 year old male s/p R TKA  Clinical Impression  Pt is s/p R TKA resulting in the deficits listed below (see PT Problem List).  Pt will benefit from skilled PT to increase their independence and safety with mobility to allow discharge to the venue listed below.  Pt mobilizing well POD #1 and plans to d/c home with spouse.     Follow Up Recommendations Home health PT    Equipment Recommendations  None recommended by PT    Recommendations for Other Services       Precautions / Restrictions Precautions Precautions: Knee Required Braces or Orthoses: Knee Immobilizer - Right Knee Immobilizer - Right: Discontinue once straight leg raise with < 10 degree lag (pt was able to perform SLR) Restrictions Weight Bearing Restrictions: No Other Position/Activity Restrictions: WBAT      Mobility  Bed Mobility Overal bed mobility: Needs Assistance Bed Mobility: Supine to Sit     Supine to sit: Min assist     General bed mobility comments: assist for R LE  Transfers Overall transfer level: Needs assistance Equipment used: Rolling walker (2 wheeled) Transfers: Sit to/from Stand Sit to Stand: Min guard;From elevated surface         General transfer comment: verbal cues for safe technique  Ambulation/Gait Ambulation/Gait assistance: Min guard Ambulation Distance (Feet): 120 Feet Assistive device: Rolling walker (2 wheeled) Gait Pattern/deviations: Step-to pattern;Antalgic     General Gait Details: verbal cues for sequence, step length, RW distance, posture  Stairs            Wheelchair Mobility    Modified Rankin (Stroke Patients Only)       Balance                                             Pertinent Vitals/Pain Pain Assessment: 0-10 Pain Score: 4  Pain Location: R  knee Pain Descriptors / Indicators: Aching;Sore Pain Intervention(s): Premedicated before session;Repositioned;Ice applied;Limited activity within patient's tolerance;Monitored during session    [place] expects to be discharged to:: Private residence Living Arrangements: Spouse/significant other   Type of Home: House Home Access: Stairs to enter Entrance Stairs-Rails: Right Entrance Stairs-Number of Steps: 3 Home Layout: One level Home Equipment: [place] - 2 wheels;Toilet riser;Crutches      Prior Function Level of Independence: Independent               Hand Dominance        Extremity/Trunk Assessment               Lower Extremity Assessment: RLE deficits/detail RLE Deficits / Details: good quad contraction, able to perform SLR, AAROM knee flexion 95*       Communication   Communication: No difficulties  Cognition Arousal/Alertness: Awake/alert Behavior During Therapy: WFL for tasks assessed/performed Overall Cognitive Status: Within Functional Limits for tasks assessed                      General Comments      Exercises Total Joint Exercises Ankle Circles/Pumps: AROM;Both;15 reps Quad Sets: AROM;Both;15 reps Towel Squeeze: AROM;Both;15 reps Short Arc QuadSinclair Ship;Right;15 reps Heel Slides: AAROM;Right;15 reps Hip ABduction/ADduction: AROM;Right;15 reps Straight Leg Raises: AROM;Right;10 reps  Assessment/Plan    PT Assessment Patient needs continued PT services  PT Diagnosis Difficulty walking   PT Problem List Decreased range of motion;Decreased strength;Pain;Decreased mobility;Decreased knowledge of use of DME  PT Treatment Interventions Functional mobility training;Stair training;DME instruction;Gait training;Patient/family education;Therapeutic activities;Therapeutic exercise   PT Goals (Current goals can be found in the Care Plan section) Acute Rehab PT Goals PT Goal Formulation: With patient Time For Goal  Achievement: 07/10/14 Potential to Achieve Goals: Good    Frequency 7X/week   Barriers to discharge        Co-evaluation               End of Session Equipment Utilized During Treatment: Gait belt Activity Tolerance: Patient tolerated treatment well Patient left: in chair;with call bell/phone within reach;with family/visitor present Nurse Communication: Mobility status         Time: 4163-8453 PT Time Calculation (min) (ACUTE ONLY): 32 min   Charges:   PT Evaluation $Initial PT Evaluation Tier I: 1 Procedure PT Treatments $Gait Training: 8-22 mins $Therapeutic Exercise: 8-22 mins   PT G Codes:        Adaijah Endres,KATHrine E 07/06/2014, 11:40 AM Carmelia Bake, PT, DPT 07/06/2014 Pager: [phone]

## 2014-07-06 NOTE — Evaluation (Signed)
Occupational Therapy One Time Evaluation Patient Details Name: Howard [name] MRN: [id number] DOB: [birth year] Today's Date: 07/06/2014    History of Present Illness Pt is a 59 year old male s/p R TKA   Clinical Impression   Pt doing well. All education completed for OT. No further questions by pt for OT. Pt will need a 3in1 if unable to borrow one. Family to check on this. Will sign off.    Follow Up Recommendations  No OT follow up;Supervision/Assistance - 24 hour    Equipment Recommendations  None recommended by OT (3in1 only if pt is unable to borrow one)    Recommendations for Other Services       Precautions / Restrictions Precautions Precautions: Knee Required Braces or Orthoses: Pt did SLR Restrictions Weight Bearing Restrictions: No Other Position/Activity Restrictions: WBAT      Mobility Bed Mobility             Transfers Overall transfer level: Needs assistance Equipment used: Rolling walker (2 wheeled) Transfers: Sit to/from Stand Sit to Stand: Min guard         General transfer comment: verbal cues for hand placement.    Balance                                            ADL Overall ADL's : Needs assistance/impaired Eating/Feeding: Independent;Sitting   Grooming: Wash/dry hands;Set up;Sitting   Upper Body Bathing: Set up;Sitting   Lower Body Bathing: Minimal assistance;Sit to/from stand   Upper Body Dressing : Set up;Sitting   Lower Body Dressing: Moderate assistance;Sit to/from stand   Toilet Transfer: Min guard;Ambulation;BSC;RW   Toileting- Water quality scientist and Hygiene: Min guard;Sit to/from stand         General ADL Comments: Demonstrated shower transfer to pt and wife and pt verbalizes understanding and declines need to practice. Explained how to use 3in1 as a shower chair and how to adjust for appropriate height. Educated on sequence for LB dressing and sit to start clothing over LEs and then  stand to pull up. Pt states he has a reacher also so demonstrated how pt can use the reacher to also start clothing over LEs. He states his wife can help also. Pt doing well. min cues to stay inside of walker. Pt's wife states she can likely obtain a 3in1 and shower seat. Also educated on option of using 3in1 in shower especially to have armrests to use.      Vision                     Perception     Praxis      Pertinent Vitals/Pain Pain Assessment: 0-10 Pain Score: 4  Pain Location: R knee Pain Descriptors / Indicators: Aching;Sore Pain Intervention(s): Premedicated before session;Repositioned;Ice applied;Limited activity within patient's tolerance;Monitored during session     Hand Dominance     Extremity/Trunk Assessment Upper Extremity Assessment Upper Extremity Assessment: Overall WFL for tasks assessed          Communication Communication Communication: No difficulties   Cognition Arousal/Alertness: Awake/alert Behavior During Therapy: WFL for tasks assessed/performed Overall Cognitive Status: Within Functional Limits for tasks assessed                     General Comments       Exercises       Shoulder Instructions  Home Living Family/patient expects to be discharged to:: Private residence Living Arrangements: Spouse/significant other   Type of Home: House Home Access: Stairs to enter Technical brewer of Steps: 3 Entrance Stairs-Rails: Right Home Layout: One level     Bathroom Shower/Tub: Occupational psychologist: Handicapped height     Home Equipment: Environmental consultant - 2 wheels;Toilet riser;Crutches   Additional Comments: thinks he can borrow a 3in1      Prior Functioning/Environment Level of Independence: Independent             OT Diagnosis: Generalized weakness   OT Problem List: Decreased strength;Decreased knowledge of use of DME or AE   OT Treatment/Interventions: Self-care/ADL training;Patient/family  education;Therapeutic activities;DME and/or AE instruction    OT Goals(Current goals can be found in the care plan section) Acute Rehab OT Goals Patient Stated Goal: home tomorrow OT Goal Formulation: With patient  OT Frequency: Min 2X/week   Barriers to D/C:            Co-evaluation              End of Session Equipment Utilized During Treatment: Rolling walker  Activity Tolerance: Patient tolerated treatment well Patient left: in chair;with call bell/phone within reach;with family/visitor present   Time: 2595-6387 OT Time Calculation (min): 29 min Charges:  OT General Charges $OT Visit: 1 Procedure OT Evaluation $Initial OT Evaluation Tier I: 1 Procedure OT Treatments $Self Care/Home Management : 8-22 mins $Therapeutic Activity: 8-22 mins G-Codes:    Jules Schick  [phone] 07/06/2014, 12:02 PM

## 2014-07-06 NOTE — Progress Notes (Signed)
Physical Therapy Treatment Note    07/06/14 1400  PT Visit Information  Last PT Received On 07/06/14  Assistance Needed +1  History of Present Illness Pt is a 59 year old male s/p R TKA  PT Time Calculation  PT Start Time (ACUTE ONLY) 1317  PT Stop Time (ACUTE ONLY) 1334  PT Time Calculation (min) (ACUTE ONLY) 17 min  Subjective Data  Subjective Pt ambulated again in hallway and then assisted back to bed.  Precautions  Precautions Knee  Restrictions  Other Position/Activity Restrictions WBAT  Pain Assessment  Pain Assessment 0-10  Pain Score 5  Pain Location R knee  Pain Descriptors / Indicators Aching;Sore  Pain Intervention(s) Limited activity within patient's tolerance;Monitored during session;Repositioned;Ice applied  Cognition  Arousal/Alertness Awake/alert  Behavior During Therapy WFL for tasks assessed/performed  Overall Cognitive Status Within Functional Limits for tasks assessed  Bed Mobility  Overal bed mobility Needs Assistance  Bed Mobility Supine to Sit;Sit to Supine  Supine to sit Supervision  Sit to supine Supervision  Transfers  Overall transfer level Needs assistance  Equipment used Rolling walker (2 wheeled)  Transfers Sit to/from Stand  Sit to Stand Supervision  General transfer comment verbal cues for hand placement.  Ambulation/Gait  Ambulation/Gait assistance Supervision  Ambulation Distance (Feet) 160 Feet  Assistive device Rolling walker (2 wheeled)  Gait Pattern/deviations Step-through pattern;Decreased step length - left;Decreased stance time - right;Antalgic  General Gait Details verbal cues for sequence, step length, RW distance, posture  PT - End of Session  Equipment Utilized During Treatment Gait belt  Activity Tolerance Patient tolerated treatment well  Patient left in bed;with call bell/phone within reach;with family/visitor present  PT - Assessment/Plan  PT Plan Current plan remains appropriate  PT Frequency (ACUTE ONLY) 7X/week   Follow Up Recommendations Home health PT  PT equipment None recommended by PT  PT Goal Progression  Progress towards PT goals Progressing toward goals  PT General Charges  $$ ACUTE PT VISIT 1 Procedure  PT Treatments  $Gait Training 8-22 mins   Carmelia Bake, PT, DPT 07/06/2014 Pager: [phone]

## 2014-07-06 NOTE — Discharge Instructions (Addendum)
° °Dr. Frank Aluisio °Total Joint Specialist °[facility] °[address] °[place], [place] [address] °[phone] ° °TOTAL KNEE REPLACEMENT POSTOPERATIVE DIRECTIONS ° ° ° °Knee Rehabilitation, Guidelines Following Surgery  °Results after knee surgery are often greatly improved when you follow the exercise, range of motion and muscle strengthening exercises prescribed by your doctor. Safety measures are also important to protect the knee from further injury. Any time any of these exercises cause you to have increased pain or swelling in your knee joint, decrease the amount until you are comfortable again and slowly increase them. If you have problems or questions, call your caregiver or physical therapist for advice.  ° °HOME CARE INSTRUCTIONS  °Remove items at home which could result in a fall. This includes throw rugs or furniture in walking pathways.  °Continue medications as instructed at time of discharge. °You may have some home medications which will be placed on hold until you complete the course of blood thinner medication.  °You may start showering once you are discharged home but do not submerge the incision under water. Just pat the incision dry and apply a dry gauze dressing on daily. °Walk with walker as instructed.  °You may resume a sexual relationship in one month or when given the OK by  your doctor.  °· Use walker as long as suggested by your caregivers. °· Avoid periods of inactivity such as sitting longer than an hour when not asleep. This helps prevent blood clots.  °You may put full weight on your legs and walk as much as is comfortable.  °You may return to work once you are cleared by your doctor.  °Do not drive a car for 6 weeks or until released by you surgeon.  °· Do not drive while taking narcotics.  °Wear the elastic stockings for three weeks following surgery during the day but you may remove then at night. °Make sure you keep all of your appointments after your  operation with all of your doctors and caregivers. You should call the office at the above phone number and make an appointment for approximately two weeks after the date of your surgery. °Change the dressing daily and reapply a dry dressing each time. °Please pick up a stool softener and laxative for home use as long as you are requiring pain medications. °· ICE to the affected knee every three hours for 30 minutes at a time and then as needed for pain and swelling.  Continue to use ice on the knee for pain and swelling from surgery. You may notice swelling that will progress down to the foot and ankle.  This is normal after surgery.  Elevate the leg when you are not up walking on it.   °It is important for you to complete the blood thinner medication as prescribed by your doctor. °· Continue to use the breathing machine which will help keep your temperature down.  It is common for your temperature to cycle up and down following surgery, especially at night when you are not up moving around and exerting yourself.  The breathing machine keeps your lungs expanded and your temperature down. ° °RANGE OF MOTION AND STRENGTHENING EXERCISES  °Rehabilitation of the knee is important following a knee injury or an operation. After just a few days of immobilization, the muscles of the thigh which control the knee become weakened and shrink (atrophy). Knee exercises are designed to build up the tone and strength of the thigh muscles and to improve knee   motion. Often times heat used for twenty to thirty minutes before working out will loosen up your tissues and help with improving the range of motion but do not use heat for the first two weeks following surgery. These exercises can be done on a training (exercise) mat, on the floor, on a table or on a bed. Use what ever works the best and is most comfortable for you Knee exercises include:  Leg Lifts - While your knee is still immobilized in a splint or cast, you can do  straight leg raises. Lift the leg to 60 degrees, hold for 3 sec, and slowly lower the leg. Repeat 10-20 times 2-3 times daily. Perform this exercise against resistance later as your knee gets better.  Quad and Hamstring Sets - Tighten up the muscle on the front of the thigh (Quad) and hold for 5-10 sec. Repeat this 10-20 times hourly. Hamstring sets are done by pushing the foot backward against an object and holding for 5-10 sec. Repeat as with quad sets.  A rehabilitation program following serious knee injuries can speed recovery and prevent re-injury in the future due to weakened muscles. Contact your doctor or a physical therapist for more information on knee rehabilitation.   SKILLED REHAB INSTRUCTIONS: If the patient is transferred to a skilled rehab facility following release from the hospital, a list of the current medications will be sent to the facility for the patient to continue.  When discharged from the skilled rehab facility, please have the facility set up the patient's [place] prior to being released. Also, the skilled facility will be responsible for providing the patient with their medications at time of release from the facility to include their pain medication, the muscle relaxants, and their blood thinner medication. If the patient is still at the rehab facility at time of the two week follow up appointment, the skilled rehab facility will also need to assist the patient in arranging follow up appointment in our office and any transportation needs.  MAKE SURE YOU:  Understand these instructions.  Will watch your condition.  Will get help right away if you are not doing well or get worse.    Pick up stool softner and laxative for home use following surgery while on pain medications. Do not submerge incision under water. Please use good hand washing techniques while changing dressing each day. May shower starting three days after surgery. Please use a clean  towel to pat the incision dry following showers. Continue to use ice for pain and swelling after surgery. Do not use any lotions or creams on the incision until instructed by your surgeon.  Take Xarelto for two and a half more weeks, then discontinue Xarelto. Once the patient has completed the Xarelto, they may resume the 81 mg Aspirin.   Postoperative Constipation Protocol  Constipation - defined medically as fewer than three stools per week and severe constipation as less than one stool per week.  One of the most common issues patients have following surgery is constipation.  Even if you have a regular bowel pattern at home, your normal regimen is likely to be disrupted due to multiple reasons following surgery.  Combination of anesthesia, postoperative narcotics, change in appetite and fluid intake all can affect your bowels.  In order to avoid complications following surgery, here are some recommendations in order to help you during your recovery period.  Colace (docusate) - Pick up an over-the-counter form of Colace or another stool softener and  take twice a day as long as you are requiring postoperative pain medications.  Take with a full glass of water daily.  If you experience loose stools or diarrhea, hold the colace until you stool forms back up.  If your symptoms do not get better within 1 week or if they get worse, check with your doctor.  Dulcolax (bisacodyl) - Pick up over-the-counter and take as directed by the product packaging as needed to assist with the movement of your bowels.  Take with a full glass of water.  Use this product as needed if not relieved by Colace only.   MiraLax (polyethylene glycol) - Pick up over-the-counter to have on hand.  MiraLax is a solution that will increase the amount of water in your bowels to assist with bowel movements.  Take as directed and can mix with a glass of water, juice, soda, coffee, or tea.  Take if you go more than two days without a  movement. Do not use MiraLax more than once per day. Call your doctor if you are still constipated or irregular after using this medication for 7 days in a row.  If you continue to have problems with postoperative constipation, please contact the office for further assistance and recommendations.  If you experience "the worst abdominal pain ever" or develop nausea or vomiting, please contact the office immediatly for further recommendations for treatment.   Information on my medicine - XARELTO (Rivaroxaban)  This medication education was reviewed with me or my healthcare representative as part of my discharge preparation.  The pharmacist that spoke with me during my hospital stay was:  WOFFORD, DREW A, RPH  Why was Xarelto prescribed for you? Xarelto was prescribed for you to reduce the risk of blood clots forming after orthopedic surgery. The medical term for these abnormal blood clots is venous thromboembolism (VTE).  What do you need to know about xarelto ? Take your Xarelto ONCE DAILY at the same time every day. You may take it either with or without food.  If you have difficulty swallowing the tablet whole, you may crush it and mix in applesauce just prior to taking your dose.  Take Xarelto exactly as prescribed by your doctor and DO NOT stop taking Xarelto without talking to the doctor who prescribed the medication.  Stopping without other VTE prevention medication to take the place of Xarelto may increase your risk of developing a clot.  After discharge, you should have regular check-up appointments with your healthcare provider that is prescribing your Xarelto.    What do you do if you miss a dose? If you miss a dose, take it as soon as you remember on the same day then continue your regularly scheduled once daily regimen the next day. Do not take two doses of Xarelto on the same day.   Important Safety Information A possible side effect of Xarelto is bleeding. You  should call your healthcare provider right away if you experience any of the following: ? Bleeding from an injury or your nose that does not stop. ? Unusual colored urine (red or dark brown) or unusual colored stools (red or black). ? Unusual bruising for unknown reasons. ? A serious fall or if you hit your head (even if there is no bleeding).  Some medicines may interact with Xarelto and might increase your risk of bleeding while on Xarelto. To help avoid this, consult your healthcare provider or pharmacist prior to using any new prescription or non-prescription medications, including herbals,  vitamins, non-steroidal anti-inflammatory drugs (NSAIDs) and supplements.  This website has more information on Xarelto: [email or web address]

## 2014-07-07 LAB — BASIC METABOLIC PANEL
ANION GAP: 5 (ref 5–15)
BUN: 15 mg/dL (ref 6–23)
CALCIUM: 8.5 mg/dL (ref 8.4–10.5)
CO2: 30 mmol/L (ref 19–32)
Chloride: 103 mEq/L (ref 96–112)
Creatinine, Ser: 0.76 mg/dL (ref 0.50–1.35)
GFR calc Af Amer: >90 mL/min (ref >90)
GFR calc non Af Amer: >90 mL/min (ref >90)
GLUCOSE: 123 mg/dL — AB (ref 70–99)
POTASSIUM: 3.9 mmol/L (ref 3.5–5.1)
SODIUM: 138 mmol/L (ref 135–145)

## 2014-07-07 LAB — CBC
HEMATOCRIT: 30.5 % — AB (ref 39.0–52.0)
HEMOGLOBIN: 10.2 g/dL — AB (ref 13.0–17.0)
MCH: 31.1 pg (ref 26.0–34.0)
MCHC: 33.4 g/dL (ref 30.0–36.0)
MCV: 93 fL (ref 78.0–100.0)
Platelets: 215 10*3/uL (ref 150–400)
RBC: 3.28 MIL/uL — ABNORMAL LOW (ref 4.22–5.81)
RDW: 13 % (ref 11.5–15.5)
WBC: 12.7 10*3/uL — ABNORMAL HIGH (ref 4.0–10.5)

## 2014-07-07 MED ORDER — RIVAROXABAN 10 MG PO TABS: 10.0000 mg | ORAL_TABLET | Freq: Every day | ORAL | Status: DC

## 2014-07-07 MED ORDER — OXYCODONE HCL 5 MG PO TABS: 5.0000 mg | ORAL_TABLET | ORAL | Status: DC | PRN

## 2014-07-07 MED ORDER — METHOCARBAMOL 500 MG PO TABS: 500.0000 mg | ORAL_TABLET | Freq: Four times a day (QID) | ORAL | Status: DC | PRN

## 2014-07-07 MED ORDER — TRAMADOL HCL 50 MG PO TABS: 50.0000 mg | ORAL_TABLET | Freq: Four times a day (QID) | ORAL | Status: DC | PRN

## 2014-07-07 NOTE — Discharge Summary (Signed)
Physician Discharge Summary   Patient ID: Howard [name] MRN: [id number] DOB/AGE: Feb 13, 1956 59 y.o.  Admit date: 07/05/2014 Discharge date: 07/07/2014  Primary Diagnosis:  Osteoarthritis Right knee(s) Admission Diagnoses:  Past Medical History  Diagnosis Date  . Hypertension   . Elevated cholesterol   . Arthritis   . Esophageal spasm   . Hypothyroidism   . Cancer     skin cancer removed  . Hypogonadism male   . Sleep apnea     uses c-pap- SELDOM USES SETTING IS 10  . GERD (gastroesophageal reflux disease)   . Family history of adverse reaction to anesthesia     FATHER REACTED TO LIDOCAINE   . Elevated uric acid in blood   . History of nonmelanoma skin cancer   . Anxiety   . Bilateral shoulder pain   . Borderline diabetes    Discharge Diagnoses:   Principal Problem:   OA (osteoarthritis) of knee  Estimated body mass index is 32.47 kg/(m^2) as calculated from the following:   Height as of this encounter: 6' 2" (1.88 m).   Weight as of this encounter: 114.76 kg (253 lb).  Procedure:  Procedure(s) (LRB): RIGHT TOTAL KNEE ARTHROPLASTY (Right)   Consults: None  HPI: Howard [name] is a 59 y.o. year old male with end stage OA of his right knee with progressively worsening pain and dysfunction. He has constant pain, with activity and at rest and significant functional deficits with difficulties even with ADLs. He has had extensive non-op management including analgesics, injections of cortisone and viscosupplements, and home exercise program, but remains in significant pain with significant dysfunction. Radiographs show tricompartmental bone on bone changes with tibial subluxation. He presents now for right Total Knee Arthroplasty. Laboratory Data: Admission on 07/05/2014, Discharged on 07/07/2014  Component Date Value Ref Range Status  . ABO/RH(D) 07/05/2014 A NEG   Final  . Antibody Screen 07/05/2014 NEG   Final  . Sample Expiration 07/05/2014 07/08/2014   Final    . ABO/RH(D) 07/05/2014 A NEG   Final  . Glucose-Capillary 07/05/2014 95  70 - 99 mg/dL Final  . Comment 1 07/05/2014 Documented in Chart   Final  . Comment 2 07/05/2014 Notify RN   Final  . WBC 07/06/2014 13.1* 4.0 - 10.5 K/uL Final  . RBC 07/06/2014 3.72* 4.22 - 5.81 MIL/uL Final  . Hemoglobin 07/06/2014 11.7* 13.0 - 17.0 g/dL Final  . HCT 07/06/2014 34.2* 39.0 - 52.0 % Final  . MCV 07/06/2014 91.9  78.0 - 100.0 fL Final  . MCH 07/06/2014 31.5  26.0 - 34.0 pg Final  . MCHC 07/06/2014 34.2  30.0 - 36.0 g/dL Final  . RDW 07/06/2014 12.6  11.5 - 15.5 % Final  . Platelets 07/06/2014 226  150 - 400 K/uL Final  . Sodium 07/06/2014 137  135 - 145 mmol/L Final   Please note change in reference range.  . Potassium 07/06/2014 4.1  3.5 - 5.1 mmol/L Final   Please note change in reference range.  . Chloride 07/06/2014 103  96 - 112 mEq/L Final  . CO2 07/06/2014 27  19 - 32 mmol/L Final  . Glucose, Bld 07/06/2014 140* 70 - 99 mg/dL Final  . BUN 07/06/2014 12  6 - 23 mg/dL Final  . Creatinine, Ser 07/06/2014 0.71  0.50 - 1.35 mg/dL Final  . Calcium 07/06/2014 8.5  8.4 - 10.5 mg/dL Final  . GFR calc non Af Amer 07/06/2014 >90  >90 mL/min Final  . GFR calc Af  Amer 07/06/2014 >90  >90 mL/min Final   Comment: (NOTE) The eGFR has been calculated using the CKD EPI equation. This calculation has not been validated in all clinical situations. eGFR's persistently <90 mL/min signify possible Chronic Kidney Disease.   . Anion gap 07/06/2014 7  5 - 15 Final  . WBC 07/07/2014 12.7* 4.0 - 10.5 K/uL Final  . RBC 07/07/2014 3.28* 4.22 - 5.81 MIL/uL Final  . Hemoglobin 07/07/2014 10.2* 13.0 - 17.0 g/dL Final  . HCT 07/07/2014 30.5* 39.0 - 52.0 % Final  . MCV 07/07/2014 93.0  78.0 - 100.0 fL Final  . MCH 07/07/2014 31.1  26.0 - 34.0 pg Final  . MCHC 07/07/2014 33.4  30.0 - 36.0 g/dL Final  . RDW 07/07/2014 13.0  11.5 - 15.5 % Final  . Platelets 07/07/2014 215  150 - 400 K/uL Final  . Sodium 07/07/2014  138  135 - 145 mmol/L Final   Please note change in reference range.  . Potassium 07/07/2014 3.9  3.5 - 5.1 mmol/L Final   Please note change in reference range.  . Chloride 07/07/2014 103  96 - 112 mEq/L Final  . CO2 07/07/2014 30  19 - 32 mmol/L Final  . Glucose, Bld 07/07/2014 123* 70 - 99 mg/dL Final  . BUN 07/07/2014 15  6 - 23 mg/dL Final  . Creatinine, Ser 07/07/2014 0.76  0.50 - 1.35 mg/dL Final  . Calcium 07/07/2014 8.5  8.4 - 10.5 mg/dL Final  . GFR calc non Af Amer 07/07/2014 >90  >90 mL/min Final  . GFR calc Af Amer 07/07/2014 >90  >90 mL/min Final   Comment: (NOTE) The eGFR has been calculated using the CKD EPI equation. This calculation has not been validated in all clinical situations. eGFR's persistently <90 mL/min signify possible Chronic Kidney Disease.   Georgiann Hahn gap 07/07/2014 5  5 - 15 Final  Hospital Outpatient Visit on 06/29/2014  Component Date Value Ref Range Status  . aPTT 06/29/2014 33  24 - 37 seconds Final  . WBC 06/29/2014 6.8  4.0 - 10.5 K/uL Final  . RBC 06/29/2014 4.43  4.22 - 5.81 MIL/uL Final  . Hemoglobin 06/29/2014 13.8  13.0 - 17.0 g/dL Final  . HCT 06/29/2014 40.7  39.0 - 52.0 % Final  . MCV 06/29/2014 91.9  78.0 - 100.0 fL Final  . MCH 06/29/2014 31.2  26.0 - 34.0 pg Final  . MCHC 06/29/2014 33.9  30.0 - 36.0 g/dL Final  . RDW 06/29/2014 12.4  11.5 - 15.5 % Final  . Platelets 06/29/2014 266  150 - 400 K/uL Final  . Sodium 06/29/2014 134* 135 - 145 mmol/L Final   Please note change in reference range.  . Potassium 06/29/2014 4.4  3.5 - 5.1 mmol/L Final   Please note change in reference range.  . Chloride 06/29/2014 98  96 - 112 mEq/L Final  . CO2 06/29/2014 28  19 - 32 mmol/L Final  . Glucose, Bld 06/29/2014 104* 70 - 99 mg/dL Final  . BUN 06/29/2014 15  6 - 23 mg/dL Final  . Creatinine, Ser 06/29/2014 0.74  0.50 - 1.35 mg/dL Final  . Calcium 06/29/2014 9.5  8.4 - 10.5 mg/dL Final  . Total Protein 06/29/2014 7.7  6.0 - 8.3 g/dL Final  .  Albumin 06/29/2014 4.4  3.5 - 5.2 g/dL Final  . AST 06/29/2014 42* 0 - 37 U/L Final  . ALT 06/29/2014 33  0 - 53 U/L Final  . Alkaline Phosphatase 06/29/2014  66  39 - 117 U/L Final  . Total Bilirubin 06/29/2014 0.7  0.3 - 1.2 mg/dL Final  . GFR calc non Af Amer 06/29/2014 >90  >90 mL/min Final  . GFR calc Af Amer 06/29/2014 >90  >90 mL/min Final   Comment: (NOTE) The eGFR has been calculated using the CKD EPI equation. This calculation has not been validated in all clinical situations. eGFR's persistently <90 mL/min signify possible Chronic Kidney Disease.   . Anion gap 06/29/2014 8  5 - 15 Final  . Prothrombin Time 06/29/2014 12.7  11.6 - 15.2 seconds Final  . INR 06/29/2014 0.94  0.00 - 1.49 Final  . Color, Urine 06/29/2014 YELLOW  YELLOW Final  . APPearance 06/29/2014 CLEAR  CLEAR Final  . Specific Gravity, Urine 06/29/2014 1.010  1.005 - 1.030 Final  . pH 06/29/2014 7.0  5.0 - 8.0 Final  . Glucose, UA 06/29/2014 NEGATIVE  NEGATIVE mg/dL Final  . Hgb urine dipstick 06/29/2014 NEGATIVE  NEGATIVE Final  . Bilirubin Urine 06/29/2014 NEGATIVE  NEGATIVE Final  . Ketones, ur 06/29/2014 NEGATIVE  NEGATIVE mg/dL Final  . Protein, ur 06/29/2014 NEGATIVE  NEGATIVE mg/dL Final  . Urobilinogen, UA 06/29/2014 0.2  0.0 - 1.0 mg/dL Final  . Nitrite 06/29/2014 NEGATIVE  NEGATIVE Final  . Leukocytes, UA 06/29/2014 NEGATIVE  NEGATIVE Final   MICROSCOPIC NOT DONE ON URINES WITH NEGATIVE PROTEIN, BLOOD, LEUKOCYTES, NITRITE, OR GLUCOSE <1000 mg/dL.  Marland Kitchen MRSA, PCR 06/29/2014 NEGATIVE  NEGATIVE Final  . Staphylococcus aureus 06/29/2014 NEGATIVE  NEGATIVE Final   Comment:        The Xpert SA Assay (FDA approved for NASAL specimens in patients over 45 years of age), is one component of a comprehensive surveillance program.  Test performance has been validated by [employer] for patients greater than or equal to 49 year old. It is not intended to diagnose infection nor to guide or monitor  treatment.      X-Rays:Dg Chest 2 View  06/29/2014   CLINICAL DATA:  Hypertension.  EXAM: CHEST  2 VIEW  COMPARISON:  12/06/2011.  FINDINGS: Mediastinum and hilar structures are normal. The lungs are clear. Heart size normal. No pleural effusion or pneumothorax. Degenerative changes thoracic spine.  IMPRESSION: Stable chest.  No acute cardiopulmonary disease.   Electronically Signed   By: Marcello Moores  Register   On: 06/29/2014 14:46    EKG: Orders placed or performed in visit on 06/29/14  . EKG 12-Lead     Hospital Course: Howard [name] is a 59 y.o. who was admitted to [facility] They were brought to the operating room on 07/05/2014 and underwent Procedure(s): RIGHT TOTAL KNEE ARTHROPLASTY.  Patient tolerated the procedure well and was later transferred to the recovery room and then to the orthopaedic floor for postoperative care.  They were given PO and IV analgesics for pain control following their surgery.  They were given 24 hours of postoperative antibiotics of  Anti-infectives    Start     Dose/Rate Route Frequency Ordered Stop   07/05/14 1800  ceFAZolin (ANCEF) IVPB 2 g/50 mL premix     2 g100 mL/hr over 30 Minutes Intravenous Every 6 hours 07/05/14 1532 07/06/14 0027   07/05/14 0952  ceFAZolin (ANCEF) IVPB 2 g/50 mL premix     2 g100 mL/hr over 30 Minutes Intravenous On call to O.R. 07/05/14 1740 07/05/14 1232     and started on DVT prophylaxis in the form of Xarelto.   PT and OT were  ordered for total joint protocol.  Discharge planning consulted to help with postop disposition and equipment needs.  Patient had a decent night on the evening of surgery but not much sleep.  They started to get up OOB with therapy on day one. Hemovac drain was pulled without difficulty.  Continued to work with therapy into day two.  Dressing was changed on day two and the incision was healing well.  Patient was seen in rounds and was ready to go home.  Discharge home with home health Diet -  Cardiac diet Follow up - in 2 weeks Activity - WBAT Disposition - Home Condition Upon Discharge - Good D/C Meds - See DC Summary DVT Prophylaxis - Xarelto      Discharge Instructions    Call MD / Call 911    Complete by:  As directed   If you experience chest pain or shortness of breath, CALL 911 and be transported to the hospital emergency room.  If you develope a fever above 101 F, pus (white drainage) or increased drainage or redness at the wound, or calf pain, call your surgeon's office.     Change dressing    Complete by:  As directed   Change dressing daily with sterile 4 x 4 inch gauze dressing and apply TED hose. Do not submerge the incision under water.     Constipation Prevention    Complete by:  As directed   Drink plenty of fluids.  Prune juice may be helpful.  You may use a stool softener, such as Colace (over the counter) 100 mg twice a day.  Use MiraLax (over the counter) for constipation as needed.     Diet - low sodium heart healthy    Complete by:  As directed      Discharge instructions    Complete by:  As directed   Pick up stool softner and laxative for home use following surgery while on pain medications. Do not submerge incision under water. Please use good hand washing techniques while changing dressing each day. May shower starting three days after surgery. Please use a clean towel to pat the incision dry following showers. Continue to use ice for pain and swelling after surgery. Do not use any lotions or creams on the incision until instructed by your surgeon.  Take Xarelto for two and a half more weeks, then discontinue Xarelto. Once the patient has completed the Xarelto, they may resume the 81 mg Aspirin.   Postoperative Constipation Protocol  Constipation - defined medically as fewer than three stools per week and severe constipation as less than one stool per week.  One of the most common issues patients have following surgery is constipation.   Even if you have a regular bowel pattern at home, your normal regimen is likely to be disrupted due to multiple reasons following surgery.  Combination of anesthesia, postoperative narcotics, change in appetite and fluid intake all can affect your bowels.  In order to avoid complications following surgery, here are some recommendations in order to help you during your recovery period.  Colace (docusate) - Pick up an over-the-counter form of Colace or another stool softener and take twice a day as long as you are requiring postoperative pain medications.  Take with a full glass of water daily.  If you experience loose stools or diarrhea, hold the colace until you stool forms back up.  If your symptoms do not get better within 1 week or if they get worse, check with  your doctor.  Dulcolax (bisacodyl) - Pick up over-the-counter and take as directed by the product packaging as needed to assist with the movement of your bowels.  Take with a full glass of water.  Use this product as needed if not relieved by Colace only.   MiraLax (polyethylene glycol) - Pick up over-the-counter to have on hand.  MiraLax is a solution that will increase the amount of water in your bowels to assist with bowel movements.  Take as directed and can mix with a glass of water, juice, soda, coffee, or tea.  Take if you go more than two days without a movement. Do not use MiraLax more than once per day. Call your doctor if you are still constipated or irregular after using this medication for 7 days in a row.  If you continue to have problems with postoperative constipation, please contact the office for further assistance and recommendations.  If you experience "the worst abdominal pain ever" or develop nausea or vomiting, please contact the office immediatly for further recommendations for treatment.     Do not put a pillow under the knee. Place it under the heel.    Complete by:  As directed      Do not sit on low chairs, stoools  or toilet seats, as it may be difficult to get up from low surfaces    Complete by:  As directed      Driving restrictions    Complete by:  As directed   No driving until released by the physician.     Increase activity slowly as tolerated    Complete by:  As directed      Lifting restrictions    Complete by:  As directed   No lifting until released by the physician.     Patient may shower    Complete by:  As directed   You may shower without a dressing once there is no drainage.  Do not wash over the wound.  If drainage remains, do not shower until drainage stops.     TED hose    Complete by:  As directed   Use stockings (TED hose) for 3 weeks on both leg(s).  You may remove them at night for sleeping.     Weight bearing as tolerated    Complete by:  As directed   Laterality:  right  Extremity:  Lower            Medication List    STOP taking these medications        aspirin 81 MG tablet     celecoxib 200 MG capsule  Commonly known as:  CELEBREX     CO Q 10 PO     diclofenac sodium 1 % Gel  Commonly known as:  VOLTAREN     fish oil-omega-3 fatty acids 1000 MG capsule     multivitamin with minerals Tabs tablet     SAW PALMETTO PO     SF 5000 PLUS 1.1 % Crea dental cream  Generic drug:  sodium fluoride     TESTIM 50 MG/5GM (1%) Gel  Generic drug:  testosterone      TAKE these medications        acetaminophen 650 MG CR tablet  Commonly known as:  TYLENOL  Take 650 mg by mouth every 8 (eight) hours as needed for pain.     allopurinol 300 MG tablet  Commonly known as:  ZYLOPRIM  Take 300 mg by mouth every morning.  BUTRANS 15 MCG/HR Ptwk  Generic drug:  Buprenorphine  Apply 1 patch topically once a week.     CRESTOR 20 MG tablet  Generic drug:  rosuvastatin  Take 10 mg by mouth daily.     DULoxetine 60 MG capsule  Commonly known as:  CYMBALTA  Take 60 mg by mouth every morning.     famotidine 20 MG tablet  Commonly known as:  PEPCID  Take 20  mg by mouth daily.     levothyroxine 175 MCG tablet  Commonly known as:  SYNTHROID, LEVOTHROID  Take 175 mcg by mouth every morning.     methocarbamol 500 MG tablet  Commonly known as:  ROBAXIN  Take 1 tablet (500 mg total) by mouth every 6 (six) hours as needed for muscle spasms.     oxyCODONE 5 MG immediate release tablet  Commonly known as:  ROXICODONE  Take 1-2 tablets (5-10 mg total) by mouth every 3 (three) hours as needed for moderate pain or severe pain.     rivaroxaban 10 MG Tabs tablet  Commonly known as:  XARELTO  - Take 1 tablet (10 mg total) by mouth daily with breakfast. Take Xarelto for two and a half more weeks, then discontinue Xarelto.  - Once the patient has completed the Xarelto, they may resume the 81 mg Aspirin.     traMADol 50 MG tablet  Commonly known as:  ULTRAM  Take 1-2 tablets (50-100 mg total) by mouth every 6 (six) hours as needed (mild pain).     valsartan-hydrochlorothiazide 320-25 MG per tablet  Commonly known as:  DIOVAN-HCT  Take 1 tablet by mouth every morning.       Follow-up Information    Follow up with [facility].   Why:  home health physical therapy   Contact information:   [place] [address] [phone]       Follow up with Gearlean Alf, MD. Schedule an appointment as soon as possible for a visit on 07/20/2014.   Specialty:  Orthopedic Surgery   Why:  Call [phone] tomorrow to make the appointment   Contact information:   [address] [place] [address] [phone]       Signed: Arlee Muslim, PA-C Orthopaedic Surgery 07/20/2014, 9:44 AM

## 2014-07-07 NOTE — Progress Notes (Signed)
   Subjective: 2 Days Post-Op Procedure(s) (LRB): RIGHT TOTAL KNEE ARTHROPLASTY (Right) Patient reports pain as mild.   Patient seen in rounds for Dr. Wynelle Link. Doing well Patient is well, and has had no acute complaints or problems Patient is ready to go home  Objective: Vital signs in last 24 hours: Temp:  [97.3 F (36.3 C)-99 F (37.2 C)] 98 F (36.7 C) (01/20 0554) Pulse Rate:  [72-82] 72 (01/20 0554) Resp:  [16] 16 (01/20 0554) BP: (111-132)/(62-81) 111/65 mmHg (01/20 0554) SpO2:  [97 %-98 %] 97 % (01/20 0554)  Intake/Output from previous day:  Intake/Output Summary (Last 24 hours) at 07/07/14 0802 Last data filed at 07/06/14 2205  Gross per 24 hour  Intake    700 ml  Output   2340 ml  Net  -1640 ml    Labs:  Recent Labs  07/06/14 0435 07/07/14 0418  HGB 11.7* 10.2*    Recent Labs  07/06/14 0435 07/07/14 0418  WBC 13.1* 12.7*  RBC 3.72* 3.28*  HCT 34.2* 30.5*  PLT 226 215    Recent Labs  07/06/14 0435 07/07/14 0418  NA 137 138  K 4.1 3.9  CL 103 103  CO2 27 30  BUN 12 15  CREATININE 0.71 0.76  GLUCOSE 140* 123*  CALCIUM 8.5 8.5   No results for input(s): LABPT, INR in the last 72 hours.  EXAM: General - Patient is Alert, Appropriate and Oriented Extremity - Neurovascular intact Sensation intact distally Dorsiflexion/Plantar flexion intact Incision - clean, dry, no drainage Motor Function - intact, moving foot and toes well on exam.   Assessment/Plan: 2 Days Post-Op Procedure(s) (LRB): RIGHT TOTAL KNEE ARTHROPLASTY (Right) Procedure(s) (LRB): RIGHT TOTAL KNEE ARTHROPLASTY (Right) Past Medical History  Diagnosis Date  . Hypertension   . Elevated cholesterol   . Arthritis   . Esophageal spasm   . Hypothyroidism   . Cancer     skin cancer removed  . Hypogonadism male   . Sleep apnea     uses c-pap- SELDOM USES SETTING IS 10  . GERD (gastroesophageal reflux disease)   . Family history of adverse reaction to anesthesia     FATHER  REACTED TO LIDOCAINE   . Elevated uric acid in blood   . History of nonmelanoma skin cancer   . Anxiety   . Bilateral shoulder pain   . Borderline diabetes    Principal Problem:   OA (osteoarthritis) of knee  Estimated body mass index is 32.47 kg/(m^2) as calculated from the following:   Height as of this encounter: 6\' 2"  (1.88 m).   Weight as of this encounter: 114.76 kg (253 lb). Up with therapy Discharge home with home health Diet - Cardiac diet Follow up - in 2 weeks Activity - WBAT Disposition - Home Condition Upon Discharge - Good D/C Meds - See DC Summary DVT Prophylaxis - Xarelto  Arlee Muslim, PA-C Orthopaedic Surgery 07/07/2014, 8:02 AM

## 2014-07-07 NOTE — Progress Notes (Signed)
Physical Therapy Treatment Patient Details Name: Howard [name] MRN: [id number] DOB: [birth year] Today's Date: 2014-07-16    History of Present Illness Pt is a 59 year old male s/p R TKA    PT Comments    Pt ambulated in hallway and practiced safe stair technique.  Pt also performed LE exercises and provided with HEP handout.  Pt and spouse had no further questions/concerns and ready for d/c home.  Follow Up Recommendations  Home health PT     Equipment Recommendations  None recommended by PT    Recommendations for Other Services       Precautions / Restrictions Precautions Precautions: Knee Restrictions Other Position/Activity Restrictions: WBAT    Mobility  Bed Mobility Overal bed mobility: Needs Assistance Bed Mobility: Supine to Sit;Sit to Supine     Supine to sit: Supervision Sit to supine: Supervision   General bed mobility comments: verbal cues for self assist  Transfers Overall transfer level: Needs assistance Equipment used: Rolling walker (2 wheeled) Transfers: Sit to/from Stand Sit to Stand: Supervision            Ambulation/Gait Ambulation/Gait assistance: Supervision Ambulation Distance (Feet): 160 Feet Assistive device: Rolling walker (2 wheeled) Gait Pattern/deviations: Step-to pattern;Antalgic     General Gait Details: verbal cues for sequence (more of step to pattern today), step length, RW distance, posture   Stairs Stairs: Yes Stairs assistance: Supervision Stair Management: Forwards;One rail Right;With crutches Number of Stairs: 5 General stair comments: verbal cues for safety and sequence  Wheelchair Mobility    Modified Rankin (Stroke Patients Only)       Balance                                    Cognition Arousal/Alertness: Awake/alert Behavior During Therapy: WFL for tasks assessed/performed Overall Cognitive Status: Within Functional Limits for tasks assessed                       Exercises Total Joint Exercises Ankle Circles/Pumps: AROM;Both;15 reps Quad Sets: AROM;Both;15 reps Towel Squeeze: AROM;Both;15 reps Short Arc QuadSinclair Ship;Right;15 reps Heel Slides: AAROM;Right;15 reps Hip ABduction/ADduction: AROM;Right;15 reps Straight Leg Raises: AROM;Right;10 reps    General Comments        Pertinent Vitals/Pain Pain Assessment: 0-10 Pain Score: 5  Pain Location: R knee Pain Descriptors / Indicators: Sore;Aching Pain Intervention(s): Limited activity within patient's tolerance;Monitored during session;Repositioned;Ice applied    Home Living                      Prior Function            PT Goals (current goals can now be found in the care plan section) Progress towards PT goals: Progressing toward goals    Frequency  7X/week    PT Plan Current plan remains appropriate    Co-evaluation             End of Session   Activity Tolerance: Patient tolerated treatment well Patient left: in bed;with call bell/phone within reach;with family/visitor present     Time: 1036-1101 PT Time Calculation (min) (ACUTE ONLY): 25 min  Charges:  $Gait Training: 8-22 mins $Therapeutic Exercise: 8-22 mins                    G Codes:      Robbye Dede,KATHrine E 07-16-2014, 12:42 PM Carmelia Bake, PT, DPT 07/16/2014 Pager:  [phone]   

## 2014-07-08 NOTE — Progress Notes (Signed)
Discharge summary sent to payer through [facility]  

## 2016-07-02 DIAGNOSIS — J189 Pneumonia, unspecified organism: Secondary | ICD-10-CM | POA: Diagnosis not present

## 2016-07-02 DIAGNOSIS — R52 Pain, unspecified: Secondary | ICD-10-CM | POA: Diagnosis not present

## 2016-07-02 DIAGNOSIS — R05 Cough: Secondary | ICD-10-CM | POA: Diagnosis not present

## 2016-09-28 DIAGNOSIS — Z471 Aftercare following joint replacement surgery: Secondary | ICD-10-CM | POA: Diagnosis not present

## 2016-09-28 DIAGNOSIS — M1712 Unilateral primary osteoarthritis, left knee: Secondary | ICD-10-CM | POA: Diagnosis not present

## 2016-09-28 DIAGNOSIS — Z96651 Presence of right artificial knee joint: Secondary | ICD-10-CM | POA: Diagnosis not present

## 2017-01-30 DIAGNOSIS — M1712 Unilateral primary osteoarthritis, left knee: Secondary | ICD-10-CM | POA: Diagnosis not present

## 2017-02-21 DIAGNOSIS — H2513 Age-related nuclear cataract, bilateral: Secondary | ICD-10-CM | POA: Diagnosis not present

## 2017-03-06 DIAGNOSIS — Z0000 Encounter for general adult medical examination without abnormal findings: Secondary | ICD-10-CM | POA: Diagnosis not present

## 2017-03-06 DIAGNOSIS — E298 Other testicular dysfunction: Secondary | ICD-10-CM | POA: Diagnosis not present

## 2017-03-06 DIAGNOSIS — E038 Other specified hypothyroidism: Secondary | ICD-10-CM | POA: Diagnosis not present

## 2017-03-14 DIAGNOSIS — J189 Pneumonia, unspecified organism: Secondary | ICD-10-CM | POA: Diagnosis not present

## 2017-03-14 DIAGNOSIS — Z23 Encounter for immunization: Secondary | ICD-10-CM | POA: Diagnosis not present

## 2017-03-14 DIAGNOSIS — Z1389 Encounter for screening for other disorder: Secondary | ICD-10-CM | POA: Diagnosis not present

## 2017-03-14 DIAGNOSIS — R7301 Impaired fasting glucose: Secondary | ICD-10-CM | POA: Diagnosis not present

## 2017-03-14 DIAGNOSIS — Z0000 Encounter for general adult medical examination without abnormal findings: Secondary | ICD-10-CM | POA: Diagnosis not present

## 2017-03-20 DIAGNOSIS — Z1212 Encounter for screening for malignant neoplasm of rectum: Secondary | ICD-10-CM | POA: Diagnosis not present

## 2017-03-27 DIAGNOSIS — M1712 Unilateral primary osteoarthritis, left knee: Secondary | ICD-10-CM | POA: Diagnosis not present

## 2017-04-30 DIAGNOSIS — M1712 Unilateral primary osteoarthritis, left knee: Secondary | ICD-10-CM | POA: Diagnosis not present

## 2017-05-27 DIAGNOSIS — J0140 Acute pansinusitis, unspecified: Secondary | ICD-10-CM | POA: Diagnosis not present

## 2017-07-04 DIAGNOSIS — R05 Cough: Secondary | ICD-10-CM | POA: Diagnosis not present

## 2017-07-04 DIAGNOSIS — J309 Allergic rhinitis, unspecified: Secondary | ICD-10-CM | POA: Diagnosis not present

## 2017-07-24 DIAGNOSIS — J398 Other specified diseases of upper respiratory tract: Secondary | ICD-10-CM | POA: Diagnosis not present

## 2017-07-24 DIAGNOSIS — R0609 Other forms of dyspnea: Secondary | ICD-10-CM | POA: Diagnosis not present

## 2017-07-24 DIAGNOSIS — R05 Cough: Secondary | ICD-10-CM | POA: Diagnosis not present

## 2017-08-01 ENCOUNTER — Telehealth: Payer: Self-pay | Admitting: *Deleted

## 2017-08-01 NOTE — Telephone Encounter (Signed)
Referral sent to scheduling. 

## 2017-08-06 DIAGNOSIS — Z1211 Encounter for screening for malignant neoplasm of colon: Secondary | ICD-10-CM | POA: Diagnosis not present

## 2017-08-06 DIAGNOSIS — K648 Other hemorrhoids: Secondary | ICD-10-CM | POA: Diagnosis not present

## 2017-08-06 DIAGNOSIS — K5730 Diverticulosis of large intestine without perforation or abscess without bleeding: Secondary | ICD-10-CM | POA: Diagnosis not present

## 2017-08-07 DIAGNOSIS — I10 Essential (primary) hypertension: Secondary | ICD-10-CM | POA: Diagnosis not present

## 2017-08-13 ENCOUNTER — Encounter (INDEPENDENT_AMBULATORY_CARE_PROVIDER_SITE_OTHER): Payer: Self-pay

## 2017-08-13 ENCOUNTER — Ambulatory Visit: Payer: 59 | Admitting: Cardiology

## 2017-08-13 ENCOUNTER — Other Ambulatory Visit: Payer: Self-pay | Admitting: Cardiology

## 2017-08-13 ENCOUNTER — Encounter: Payer: Self-pay | Admitting: Cardiology

## 2017-08-13 VITALS — BP 142/86 | HR 64 | Ht 74.0 in | Wt 263.8 lb

## 2017-08-13 DIAGNOSIS — Z01812 Encounter for preprocedural laboratory examination: Secondary | ICD-10-CM | POA: Diagnosis not present

## 2017-08-13 DIAGNOSIS — I739 Peripheral vascular disease, unspecified: Secondary | ICD-10-CM

## 2017-08-13 DIAGNOSIS — R29898 Other symptoms and signs involving the musculoskeletal system: Secondary | ICD-10-CM

## 2017-08-13 DIAGNOSIS — R0602 Shortness of breath: Secondary | ICD-10-CM | POA: Diagnosis not present

## 2017-08-13 DIAGNOSIS — E66812 Morbid (severe) obesity due to excess calories: Secondary | ICD-10-CM

## 2017-08-13 DIAGNOSIS — I209 Angina pectoris, unspecified: Secondary | ICD-10-CM | POA: Diagnosis not present

## 2017-08-13 DIAGNOSIS — Z6837 Body mass index (BMI) 37.0-37.9, adult: Secondary | ICD-10-CM

## 2017-08-13 LAB — BASIC METABOLIC PANEL
BUN/Creatinine Ratio: 14 (ref 10–24)
BUN: 12 mg/dL (ref 8–27)
CHLORIDE: 100 mmol/L (ref 96–106)
CO2: 24 mmol/L (ref 20–29)
Calcium: 9.5 mg/dL (ref 8.6–10.2)
Creatinine, Ser: 0.88 mg/dL (ref 0.76–1.27)
GFR calc Af Amer: 106 mL/min/{1.73_m2} (ref >59)
GFR calc non Af Amer: 91 mL/min/{1.73_m2} (ref >59)
GLUCOSE: 99 mg/dL (ref 65–99)
POTASSIUM: 4.5 mmol/L (ref 3.5–5.2)
SODIUM: 138 mmol/L (ref 134–144)

## 2017-08-13 MED ORDER — METOPROLOL TARTRATE 50 MG PO TABS: 50.0000 mg | ORAL_TABLET | Freq: Once | ORAL | 0 refills | Status: DC

## 2017-08-13 NOTE — Patient Instructions (Addendum)
Medication Instructions:  The current medical regimen is effective;  continue present plan and medications.  Labwork: Please have blood work today (BMP)  Testing/Procedures: Your physician has requested that you have an echocardiogram. Echocardiography is a painless test that uses sound waves to create images of your heart. It provides your doctor with information about the size and shape of your heart and how well your heart's chambers and valves are working. This procedure takes approximately one hour. There are no restrictions for this procedure.  Your physician has requested that you have a lower extremity arterial exercise duplex. During this test, exercise and ultrasound are used to evaluate arterial blood flow in the legs. Allow one hour for this exam. There are no restrictions or special instructions.  Your physician has requested that you have cardiac CT. Cardiac computed tomography (CT) is a painless test that uses an x-ray machine to take clear, detailed pictures of your heart. For further information please visit [email or web address]. Please follow instruction sheet as given.  Follow-Up: Further follow up will be based on the results of the above.  If you need a refill on your cardiac medications before your next appointment, please call your pharmacy.  Thank you for choosing [facility]!!    Please arrive at the [facility] main entrance of [facility] at TBD AM (30-45 minutes prior to test start time)  [facility] [address] [place],  [address] [phone]  Proceed to the [facility] Radiology Department (First Floor).  Please follow these instructions carefully (unless otherwise directed):  Hold all erectile dysfunction medications at least 48 hours prior to test.  On the Night Before the Test: . Drink plenty of water. . Do not consume any caffeinated/decaffeinated beverages or chocolate 12 hours prior to your test. . Do  not take any antihistamines 12 hours prior to your test.   On the Day of the Test: . Drink plenty of water. Do not drink any water within one hour of the test. . Do not eat any food 4 hours prior to the test. . You may take your regular medications prior to the test. . IF NOT ON A BETA BLOCKER - Take 50 mg of lopressor (metoprolol) one hour before the test. . HOLD Furosemide morning of the test.  After the Test: . Drink plenty of water. . After receiving IV contrast, you may experience a mild flushed feeling. This is normal. . On occasion, you may experience a mild rash up to 24 hours after the test. This is not dangerous. If this occurs, you can take Benadryl 25 mg and increase your fluid intake. . If you experience trouble breathing, this can be serious. If it is severe call 911 IMMEDIATELY. If it is mild, please call our office. . If you take any of these medications: Glipizide/Metformin, Avandament, Glucavance, please do not take 48 hours after completing test.

## 2017-08-13 NOTE — Progress Notes (Signed)
Cardiology Office Note:    Date:  08/13/2017   ID:  Howard [name], DOB 03-30-1956, MRN [id number]  PCP:  Crist Infante, MD  Cardiologist:  No primary care provider on file.   Referring MD: Crist Infante, MD     History of Present Illness:    Howard [name] is a 62 y.o. male here for evaluation of dyspnea on exertion at the request of Dr. Crist Infante.  Over the past 3 years or so he is been noting increasing shortness of breath with exertion.  This seems to have progressively gotten worse especially since he has been visiting his granddaughter, walking up hills with a 15 pound baby backpack, has to stop, feeling extremely winded.  He also feels like his legs lose sensation, feeling in his feet.  Several years ago he had a stress test which was reassuring.  He does vaguely remember an echocardiogram that he had several years ago that had a reduced ejection fraction possibly.  He remembers being less than 50%.  He quit smoking in 2002 after 30 years.  No pressure.  Shortness of breath may be anginal equivalent.  He is a Animal nutritionist, semiretired.   Mother had early CVA, died 30 dementia , Father MI early 81 MI. Howard [name]. Died in 54's.  Smoked for 30 years. Quit 2002.   Enjoys craft beer  Vagal episode several years ago. .   Past Medical History:  Diagnosis Date  . Anxiety   . Arthritis   . Bilateral shoulder pain   . Borderline diabetes   . Cancer ([place])    skin cancer removed  . Elevated cholesterol   . Elevated uric acid in blood   . Esophageal spasm   . Family history of adverse reaction to anesthesia    FATHER REACTED TO LIDOCAINE   . GERD (gastroesophageal reflux disease)   . History of nonmelanoma skin cancer   . Hypertension   . Hypogonadism male   . Hypothyroidism   . Sleep apnea    uses c-pap- SELDOM USES SETTING IS 10    Past Surgical History:  Procedure Laterality Date  . ANAL FISSURE REPAIR    . CARPAL TUNNEL RELEASE  2012 /2013     bil  . EYE SURGERY  2010  . FOOT SURGERY    . KNEE ARTHROSCOPY  12/12/2011   Procedure: ARTHROSCOPY KNEE;  Surgeon: Gearlean Alf, MD;  Location: [facility];  Service: Orthopedics;  Laterality: Left;  medial menisectomy and chondroplasty  . KNEE ARTHROSCOPY Left 10/29/2012   Procedure: LEFT ARTHROSCOPY KNEE WITH CHONDROPLASTY AND DEBRIDMENT OF MENISCUS;  Surgeon: Gearlean Alf, MD;  Location: [facility];  Service: Orthopedics;  Laterality: Left;  . KNEE SURGERY  1973 / 1983 / 1996   rt and left   . LEG SURGERY  2011   left  . SHOULDER ARTHROSCOPY     RT  . SHOULDER SURGERY     rt ROTATOR CUFF REPAIR  . TOTAL KNEE ARTHROPLASTY Right 07/05/2014   Procedure: RIGHT TOTAL KNEE ARTHROPLASTY;  Surgeon: Gearlean Alf, MD;  Location: [facility];  Service: Orthopedics;  Laterality: Right;    Current Medications: Current Meds  Medication Sig  . acetaminophen (TYLENOL) 650 MG CR tablet Take 650 mg by mouth every 8 (eight) hours as needed for pain.  Marland Kitchen allopurinol (ZYLOPRIM) 300 MG tablet Take 300 mg by mouth every morning.  Marland Kitchen amLODipine (NORVASC) 2.5 MG tablet Take 1 tablet by mouth daily.  Marland Kitchen  CRESTOR 20 MG tablet Take 10 mg by mouth daily.  . DULoxetine (CYMBALTA) 60 MG capsule Take 60 mg by mouth every morning.  . famotidine (PEPCID) 20 MG tablet Take 20 mg by mouth daily.  Marland Kitchen levothyroxine (SYNTHROID, LEVOTHROID) 175 MCG tablet Take 175 mcg by mouth every morning.   . valsartan-hydrochlorothiazide (DIOVAN-HCT) 320-25 MG per tablet Take 1 tablet by mouth every morning.      Allergies:   Sulfonamide derivatives   Social History   Socioeconomic History  . Marital status: Married    Spouse name: None  . Number of children: None  . Years of education: None  . Highest education level: None  Social Needs  . Financial resource strain: None  . Food insecurity - worry: None  . Food insecurity - inability: None  . Transportation needs - medical: None  . Transportation needs - non-medical: None   Occupational History  . None  Tobacco Use  . Smoking status: Former Smoker    Last attempt to quit: 12/05/2000    Years since quitting: 16.6  . Smokeless tobacco: Never Used  Substance and Sexual Activity  . Alcohol use: Yes    Comment: occassional  . Drug use: No  . Sexual activity: None  Other Topics Concern  . None  Social History Narrative  . None     Family History: The patient's family history includes Heart disease in his father; Hypertension in his father.  ROS:   Please see the history of present illness.    Denies any syncope, bleeding, orthopnea.  He does have shortness of breath with activity, occasional leg swelling, fatigue, snoring.  Did not tolerate a dental appliance.  All other systems reviewed and are negative.  EKGs/Labs/Other Studies Reviewed:    The following studies were reviewed today: Prior office notes, EKG personally reviewed prior chest x-ray unremarkable.  EKG:  EKG is  ordered today.  The ekg ordered today demonstrates 08/13/17-sinus rhythm 64, small nonpathologic Q waves in inferior leads no other abnormalities personally viewed. 07/24/17 ECG reviewed as well from Dr. Joylene Draft.   Recent Labs: No results found for requested labs within last 8760 hours.  Recent Lipid Panel    Component Value Date/Time   CHOL 122 02/07/2010 0914   TRIG 79.0 02/07/2010 0914   HDL 39.70 02/07/2010 0914   CHOLHDL 3 02/07/2010 0914   VLDL 15.8 02/07/2010 0914   LDLCALC 67 02/07/2010 0914   LDLDIRECT 180.9 11/19/2006 0933    Physical Exam:    VS:  BP (!) 142/86   Pulse 64   Ht 6\' 2"  (1.88 m)   Wt 263 lb 12.8 oz (119.7 kg)   SpO2 97%   BMI 33.87 kg/m     Wt Readings from Last 3 Encounters:  08/13/17 263 lb 12.8 oz (119.7 kg)  07/05/14 253 lb (114.8 kg)  06/29/14 253 lb (114.8 kg)     GEN:  Well nourished, well developed in no acute distress, obese HEENT: Normal NECK: No JVD; No carotid bruits LYMPHATICS: No lymphadenopathy CARDIAC: RRR, no murmurs,  rubs, gallops RESPIRATORY:  Clear to auscultation without rales, wheezing or rhonchi  ABDOMEN: Soft, non-tender, non-distended MUSCULOSKELETAL:  No edema; No deformity, good distal pulses.  SKIN: Warm and dry NEUROLOGIC:  Alert and oriented x 3 PSYCHIATRIC:  Normal affect   ASSESSMENT:    1. Class 2 severe obesity with serious comorbidity and body mass index (BMI) of 37.0 to 37.9 in adult, unspecified obesity type ([place])   2. Shortness  of breath   3. Angina pectoris ([place])   4. Weakness of both lower extremities   5. Pre-procedure lab exam    PLAN:    In order of problems listed above:  Shortness of breath with activity - This has increased significantly recently.  This may be an anginal equivalent.  Would like to go ahead and check a coronary CT to make sure that he does not have any significant plaque or flow-limiting disease.  Lopressor prior to scan. -We will also check an echocardiogram to ensure proper structure and function of his heart.  He remembers having an ejection fraction less than 50% previously.  We shall see.  Claudication -He has a sensation of losing awareness of his feet when walking at higher pacing.  I will check ABIs to make sure that he is not have any significant limitations.  I do believe that I feel adequate pulses distally.  Obesity -Continue to encourage weight loss, conditioning.  Ultimately, cardiac workup is unremarkable, I would like for him to continue to strive to lose 40-50 pounds.  Continue exercise.  Essential hypertension -Continue with current medications.  Amlodipine is new.  Hyperlipidemia -Continue with Crestor.  Labs have been checked by Dr. Haynes Kerns.  LDL was 84 on 03/06/17.  Hemoglobin A1c 6.1.  Hemoglobin 14.5, creatinine 0.8   Medication Adjustments/Labs and Tests Ordered: Current medicines are reviewed at length with the patient today.  Concerns regarding medicines are outlined above.  Orders Placed This Encounter  Procedures  . CT  CORONARY MORPH W/CTA COR W/SCORE W/CA W/CM &/OR WO/CM  . CT CORONARY FRACTIONAL FLOW RESERVE DATA PREP  . CT CORONARY FRACTIONAL FLOW RESERVE FLUID ANALYSIS  . Basic metabolic panel  . EKG 12-Lead  . ECHOCARDIOGRAM COMPLETE   Meds ordered this encounter  Medications  . metoprolol tartrate (LOPRESSOR) 50 MG tablet    Sig: Take 1 tablet (50 mg total) by mouth once for 1 dose. Take one hour prior to Coronary CT    Dispense:  1 tablet    Refill:  0    Signed, Candee Furbish, MD  08/13/2017 12:17 PM    [place]

## 2017-08-19 DIAGNOSIS — I10 Essential (primary) hypertension: Secondary | ICD-10-CM | POA: Diagnosis not present

## 2017-08-20 ENCOUNTER — Other Ambulatory Visit: Payer: Self-pay

## 2017-08-20 ENCOUNTER — Ambulatory Visit (HOSPITAL_COMMUNITY)
Admission: RE | Admit: 2017-08-20 | Discharge: 2017-08-20 | Disposition: A | Discharge status: Home / Self Care | Payer: 59 | Source: Ambulatory Visit | Attending: Internal Medicine | Admitting: Internal Medicine

## 2017-08-20 ENCOUNTER — Ambulatory Visit (HOSPITAL_BASED_OUTPATIENT_CLINIC_OR_DEPARTMENT_OTHER): Payer: 59

## 2017-08-20 DIAGNOSIS — E785 Hyperlipidemia, unspecified: Secondary | ICD-10-CM | POA: Diagnosis not present

## 2017-08-20 DIAGNOSIS — I119 Hypertensive heart disease without heart failure: Secondary | ICD-10-CM | POA: Insufficient documentation

## 2017-08-20 DIAGNOSIS — I209 Angina pectoris, unspecified: Secondary | ICD-10-CM | POA: Diagnosis not present

## 2017-08-20 DIAGNOSIS — R29898 Other symptoms and signs involving the musculoskeletal system: Secondary | ICD-10-CM

## 2017-08-20 DIAGNOSIS — E1151 Type 2 diabetes mellitus with diabetic peripheral angiopathy without gangrene: Secondary | ICD-10-CM | POA: Insufficient documentation

## 2017-08-20 DIAGNOSIS — I739 Peripheral vascular disease, unspecified: Secondary | ICD-10-CM

## 2017-08-20 DIAGNOSIS — R0602 Shortness of breath: Secondary | ICD-10-CM | POA: Diagnosis not present

## 2017-08-20 DIAGNOSIS — Z87891 Personal history of nicotine dependence: Secondary | ICD-10-CM | POA: Insufficient documentation

## 2017-08-20 DIAGNOSIS — I081 Rheumatic disorders of both mitral and tricuspid valves: Secondary | ICD-10-CM | POA: Diagnosis not present

## 2017-09-01 ENCOUNTER — Encounter: Payer: Self-pay | Admitting: Cardiology

## 2017-09-04 DIAGNOSIS — K648 Other hemorrhoids: Secondary | ICD-10-CM | POA: Diagnosis not present

## 2017-09-11 ENCOUNTER — Ambulatory Visit (HOSPITAL_COMMUNITY)
Admission: RE | Admit: 2017-09-11 | Discharge: 2017-09-11 | Disposition: A | Discharge status: Home / Self Care | Payer: 59 | Source: Ambulatory Visit | Attending: Cardiology | Admitting: Cardiology

## 2017-09-11 ENCOUNTER — Encounter (HOSPITAL_COMMUNITY): Payer: Self-pay

## 2017-09-11 DIAGNOSIS — I209 Angina pectoris, unspecified: Secondary | ICD-10-CM | POA: Diagnosis present

## 2017-09-11 DIAGNOSIS — I25119 Atherosclerotic heart disease of native coronary artery with unspecified angina pectoris: Secondary | ICD-10-CM | POA: Insufficient documentation

## 2017-09-11 DIAGNOSIS — R0609 Other forms of dyspnea: Secondary | ICD-10-CM | POA: Diagnosis not present

## 2017-09-11 MED ORDER — METOPROLOL TARTRATE 5 MG/5ML IV SOLN
INTRAVENOUS | Status: AC
  Administered 2017-09-11: 5 mg via INTRAVENOUS
  Filled 2017-09-11: qty 5

## 2017-09-11 MED ORDER — NITROGLYCERIN 0.4 MG SL SUBL
0.8000 mg | SUBLINGUAL_TABLET | Freq: Once | SUBLINGUAL | Status: AC
  Administered 2017-09-11: 0.8 mg via SUBLINGUAL

## 2017-09-11 MED ORDER — NITROGLYCERIN 0.4 MG SL SUBL
SUBLINGUAL_TABLET | SUBLINGUAL | Status: AC
  Filled 2017-09-11: qty 2

## 2017-09-11 MED ORDER — SODIUM CHLORIDE 0.9 % IV SOLN: Freq: Once | INTRAVENOUS | Status: DC

## 2017-09-11 MED ORDER — METOPROLOL TARTRATE 5 MG/5ML IV SOLN
5.0000 mg | INTRAVENOUS | Status: DC | PRN
  Administered 2017-09-11: 5 mg via INTRAVENOUS

## 2017-09-11 MED ORDER — IOPAMIDOL (ISOVUE-370) INJECTION 76%
INTRAVENOUS | Status: AC
  Administered 2017-09-11: 80 mL
  Filled 2017-09-11: qty 100

## 2017-09-11 NOTE — Progress Notes (Signed)
Patient informed to come back to ED or call provider if any other concerns.Patient and spouse voices understanding. Patient taken to car via wx by Probation officer. Wife driving patient home. Patient A&Ox4. Stable at d/c.

## 2017-09-11 NOTE — Progress Notes (Signed)
CT Complete.  Writer was taking patient back to IR via wheelchair. Patient had syncopal episode while in hallway. Patient taken back to CT. Vitals obtained. BP 122/64, HR 58. Patient then alert and oriented x4. Diaphoresis noted. Patient taken back to IR on stretcher.   Writer informed Dr. Meda Coffee, verbal orders obtained to give bolus of normal saline 1000ML now.   Patient A&Ox4. VVS.  Patient and family updated. Patient eating and dinking at this time.

## 2017-09-11 NOTE — Progress Notes (Signed)
Patient states he feels better and denies dizziness. Voices no complaints at this time. Patient ambulated around nurses station without difficulty.

## 2017-09-12 DIAGNOSIS — E291 Testicular hypofunction: Secondary | ICD-10-CM | POA: Diagnosis not present

## 2017-09-12 DIAGNOSIS — R7301 Impaired fasting glucose: Secondary | ICD-10-CM | POA: Diagnosis not present

## 2017-09-12 DIAGNOSIS — I10 Essential (primary) hypertension: Secondary | ICD-10-CM | POA: Diagnosis not present

## 2017-09-16 ENCOUNTER — Telehealth: Payer: Self-pay | Admitting: Cardiology

## 2017-09-16 NOTE — Telephone Encounter (Signed)
Mild to moderate non obstructive CAD.  FFR (fractional flow reserve) results:  1. Left Main: No significant stenosis.   2. LAD: No significant stenosis.  3. LCX: No significant stenosis.  4. RCA: No significant stenosis.   IMPRESSION:  1. CT FFR analysis didn't show any significant stenosis.  No need for cath.  Continue secondary prevention, exercise, Crestor.  Candee Furbish, MD   Reviewed results with patient who states understanding.  He was grateful for the call and information.

## 2017-09-16 NOTE — Telephone Encounter (Signed)
New Message:     Pt is returning call for results. Pt states he is [place] so please be mindful of the time change.

## 2017-11-14 DIAGNOSIS — M1712 Unilateral primary osteoarthritis, left knee: Secondary | ICD-10-CM | POA: Diagnosis not present

## 2018-01-23 DIAGNOSIS — M25562 Pain in left knee: Secondary | ICD-10-CM | POA: Diagnosis not present

## 2018-02-10 DIAGNOSIS — R82998 Other abnormal findings in urine: Secondary | ICD-10-CM | POA: Diagnosis not present

## 2018-02-10 DIAGNOSIS — Z0000 Encounter for general adult medical examination without abnormal findings: Secondary | ICD-10-CM | POA: Diagnosis not present

## 2018-02-10 DIAGNOSIS — Z13820 Encounter for screening for osteoporosis: Secondary | ICD-10-CM | POA: Diagnosis not present

## 2018-02-13 DIAGNOSIS — E038 Other specified hypothyroidism: Secondary | ICD-10-CM | POA: Diagnosis not present

## 2018-02-13 DIAGNOSIS — Z1389 Encounter for screening for other disorder: Secondary | ICD-10-CM | POA: Diagnosis not present

## 2018-02-13 DIAGNOSIS — Z23 Encounter for immunization: Secondary | ICD-10-CM | POA: Diagnosis not present

## 2018-02-13 DIAGNOSIS — J3089 Other allergic rhinitis: Secondary | ICD-10-CM | POA: Diagnosis not present

## 2018-02-13 DIAGNOSIS — R7301 Impaired fasting glucose: Secondary | ICD-10-CM | POA: Diagnosis not present

## 2018-02-13 DIAGNOSIS — Z0000 Encounter for general adult medical examination without abnormal findings: Secondary | ICD-10-CM | POA: Diagnosis not present

## 2018-02-25 DIAGNOSIS — H40002 Preglaucoma, unspecified, left eye: Secondary | ICD-10-CM | POA: Diagnosis not present

## 2018-02-25 DIAGNOSIS — H2513 Age-related nuclear cataract, bilateral: Secondary | ICD-10-CM | POA: Diagnosis not present

## 2018-02-27 DIAGNOSIS — M1712 Unilateral primary osteoarthritis, left knee: Secondary | ICD-10-CM | POA: Diagnosis not present

## 2018-03-04 DIAGNOSIS — Z23 Encounter for immunization: Secondary | ICD-10-CM | POA: Diagnosis not present

## 2018-04-24 DIAGNOSIS — D485 Neoplasm of uncertain behavior of skin: Secondary | ICD-10-CM | POA: Diagnosis not present

## 2018-04-24 DIAGNOSIS — L080 Pyoderma: Secondary | ICD-10-CM | POA: Diagnosis not present

## 2018-04-24 DIAGNOSIS — L570 Actinic keratosis: Secondary | ICD-10-CM | POA: Diagnosis not present

## 2018-04-24 DIAGNOSIS — Z85828 Personal history of other malignant neoplasm of skin: Secondary | ICD-10-CM | POA: Diagnosis not present

## 2018-04-24 DIAGNOSIS — D225 Melanocytic nevi of trunk: Secondary | ICD-10-CM | POA: Diagnosis not present

## 2018-04-24 DIAGNOSIS — D2239 Melanocytic nevi of other parts of face: Secondary | ICD-10-CM | POA: Diagnosis not present

## 2018-05-06 DIAGNOSIS — Z23 Encounter for immunization: Secondary | ICD-10-CM | POA: Diagnosis not present

## 2018-05-07 DIAGNOSIS — H25043 Posterior subcapsular polar age-related cataract, bilateral: Secondary | ICD-10-CM | POA: Diagnosis not present

## 2018-05-07 DIAGNOSIS — H2513 Age-related nuclear cataract, bilateral: Secondary | ICD-10-CM | POA: Diagnosis not present

## 2018-05-07 DIAGNOSIS — H25013 Cortical age-related cataract, bilateral: Secondary | ICD-10-CM | POA: Diagnosis not present

## 2018-05-07 DIAGNOSIS — H2511 Age-related nuclear cataract, right eye: Secondary | ICD-10-CM | POA: Diagnosis not present

## 2018-05-08 DIAGNOSIS — M25562 Pain in left knee: Secondary | ICD-10-CM | POA: Diagnosis not present

## 2018-07-14 DIAGNOSIS — Z6834 Body mass index (BMI) 34.0-34.9, adult: Secondary | ICD-10-CM | POA: Diagnosis not present

## 2018-07-14 DIAGNOSIS — R05 Cough: Secondary | ICD-10-CM | POA: Diagnosis not present

## 2018-07-14 DIAGNOSIS — J40 Bronchitis, not specified as acute or chronic: Secondary | ICD-10-CM | POA: Diagnosis not present

## 2018-07-17 DIAGNOSIS — M1712 Unilateral primary osteoarthritis, left knee: Secondary | ICD-10-CM | POA: Diagnosis not present

## 2018-07-17 DIAGNOSIS — M25562 Pain in left knee: Secondary | ICD-10-CM | POA: Diagnosis not present

## 2018-07-21 DIAGNOSIS — H25043 Posterior subcapsular polar age-related cataract, bilateral: Secondary | ICD-10-CM | POA: Diagnosis not present

## 2018-07-21 DIAGNOSIS — H2513 Age-related nuclear cataract, bilateral: Secondary | ICD-10-CM | POA: Diagnosis not present

## 2018-07-21 DIAGNOSIS — H25013 Cortical age-related cataract, bilateral: Secondary | ICD-10-CM | POA: Diagnosis not present

## 2018-07-21 DIAGNOSIS — H2511 Age-related nuclear cataract, right eye: Secondary | ICD-10-CM | POA: Diagnosis not present

## 2018-08-23 DIAGNOSIS — Z23 Encounter for immunization: Secondary | ICD-10-CM | POA: Diagnosis not present

## 2018-09-23 IMAGING — CT CT HEART MORP W/ CTA COR W/ SCORE W/ CA W/CM &/OR W/O CM
4 of 7 series · 8 of 20 positions shown, 9 images · IV contrast (APPLIED)
Comparison: None.

EXAM:
OVER-READ INTERPRETATION  CT CHEST

The following report is an over-read performed by radiologist Dr.
over-read does not include interpretation of cardiac or coronary
anatomy or pathology. The coronary CTA interpretation by the
cardiologist is attached.
CLINICAL DATA: 61 -year-old male with h/o obesity and CALDERIN.
Cardiac/Coronary  CT
TECHNIQUE: The patient was scanned on a Phillips Force scanner.

[Series 6: best diast 66 % · axial · 0.44mm/px · z∈[+1216,+1267]mm · 2 of 378 slices shown, 3 images]
[im 126/378  vessel]
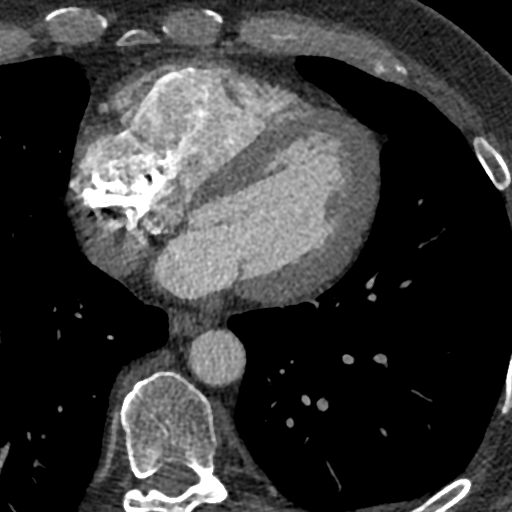
[im 126/378  lung]
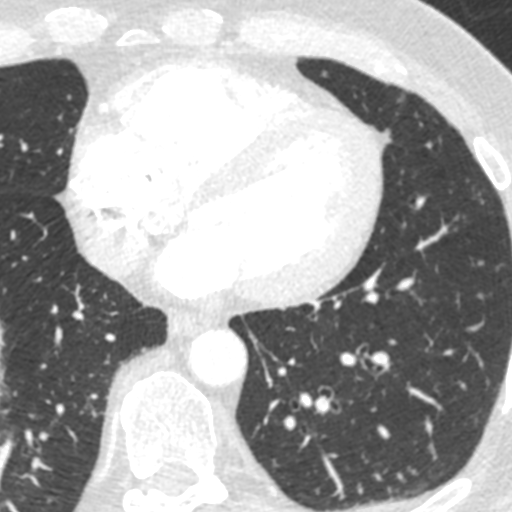
[im 252/378  vessel]
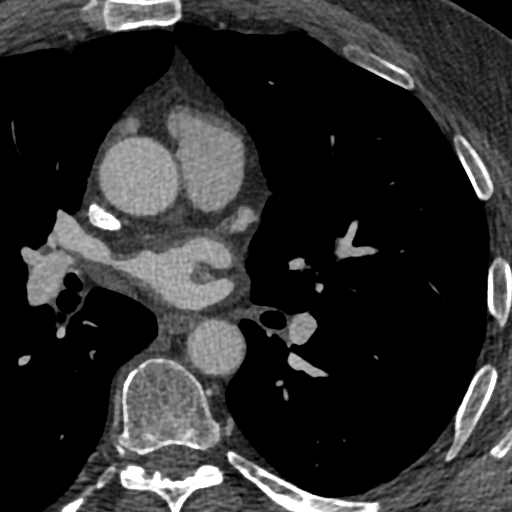

[Series 7: best syst 40 % · axial · 0.44mm/px · z∈[+1216,+1267]mm · 2 of 378 slices shown]
[im 126/378  vessel]
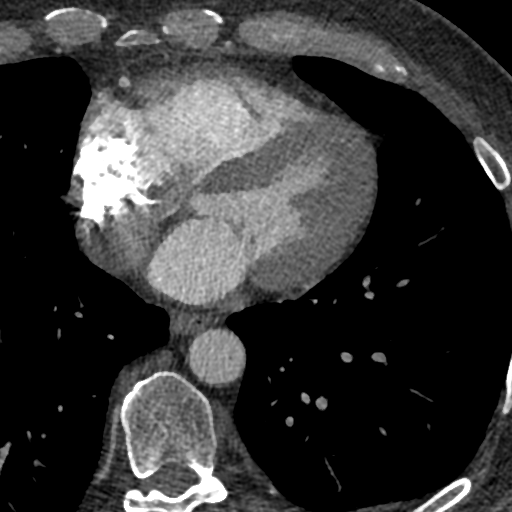
[im 252/378  vessel]
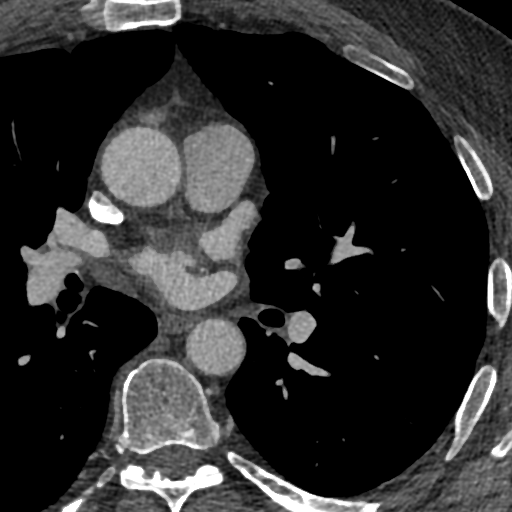

[Series 8: ts diast sharp 66 % · axial · 0.44mm/px · z∈[+1216,+1267]mm · 2 of 378 slices shown]
[im 126/378  lung]
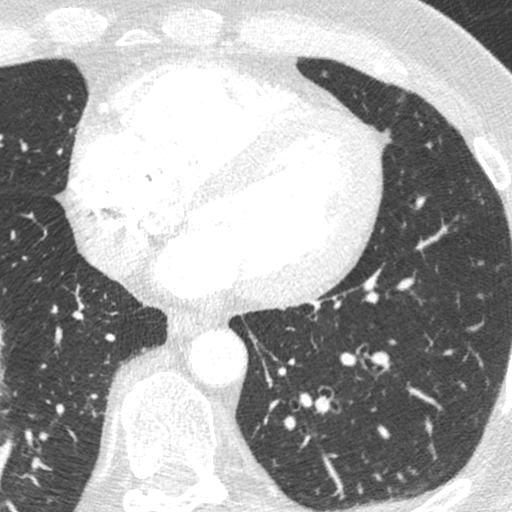
[im 252/378  lung]
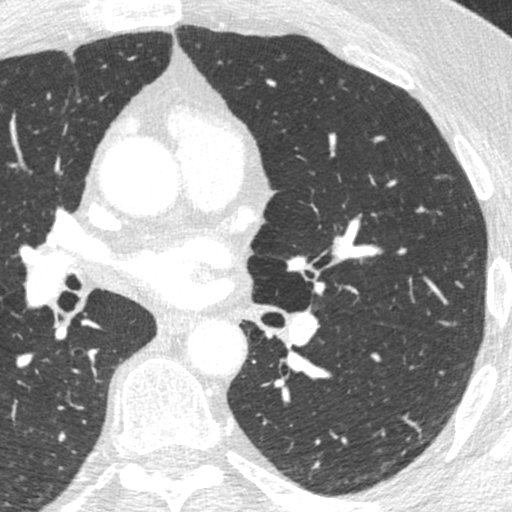

[Series 9: ts syst sharp 40 % · axial · 0.44mm/px · z∈[+1216,+1267]mm · 2 of 378 slices shown]
[im 126/378  lung]
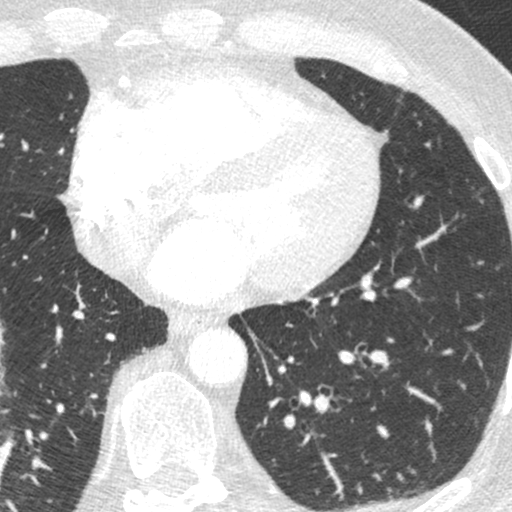
[im 252/378  lung]
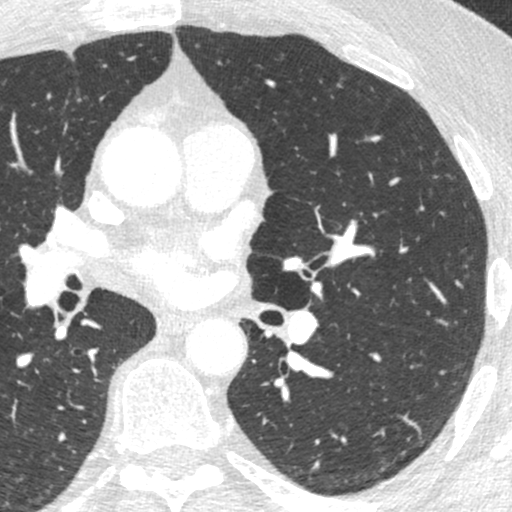

[8 of 20 positions shown; findings below may reference images not displayed]

FINDINGS: No mediastinal or hilar adenopathy identified.

No pleural effusion. The lungs are clear. No airspace consolidation,
atelectasis or mass.

Degenerative changes noted within the lower thoracic spine.
IMPRESSION: Negative over-read.
FINDINGS: A 120 kV prospective scan was triggered in the descending thoracic
aorta at 111 HU's. Axial non-contrast 3 mm slices were carried out
through the heart. The data set was analyzed on a dedicated work
station and scored using the Agatson method. Gantry rotation speed
was 250 msecs and collimation was .6 mm. 5 mg of iv Metoprolol and
0.8 mg of sl NTG was given. The 3D data set was reconstructed in 5%
intervals of the 67-82 % of the R-R cycle. Diastolic phases were
analyzed on a dedicated work station using MPR, MIP and VRT modes.
The patient received 80 cc of contrast.

Aorta:  Normal size.  No calcifications.  No dissection.

Aortic Valve:  Trileaflet.  No calcifications.

Coronary Arteries:  Normal coronary origin.  Right dominance.

RCA is a large dominant artery that gives rise to PDA and PLVB.
There is diffuse scattered predominantly calcified plaque throughout
the vessel with maximum stenosis 25-50%. PDA is poorly visualized
but with no obvious plaque.

Left main is a large artery that gives rise to LAD, ramus
intermedius and LCX arteries. LM has no plaque.

LAD is a large vessel that gives rise to one diagonal artery. Ostial
LAD has a mild calcified plaque with stenosis 25-50%. Mid LAD has
moderate calcified plaque with stenosis 50-69%. Distal LAD has only
minimal plaque.

D1 is medium size and has no significant plaque.

RI is a small artery with no obvious plaque.

LCX is a non-dominant artery that gives rise to one medium size OM1
branch. There is no plaque.

Other findings:

Normal pulmonary vein drainage into the left atrium.

Normal let atrial appendage without a thrombus.

Upper normal size of the pulmonary artery measuring 30 mm.
IMPRESSION: 1. Coronary calcium score of 112. This was 85 percentile for age and
sex matched control.

2. Normal coronary origin with right dominance.

3. Mild diffuse CAD in the RCA and ostial LAD, moderate CAD in the
mid LAD. Additional analysis with CT FFR will be submitted.

## 2018-12-22 ENCOUNTER — Other Ambulatory Visit: Payer: Self-pay | Admitting: Orthopedic Surgery

## 2018-12-25 ENCOUNTER — Other Ambulatory Visit (HOSPITAL_COMMUNITY)
Admission: RE | Admit: 2018-12-25 | Discharge: 2018-12-25 | Disposition: A | Discharge status: Home / Self Care | Payer: Medicare Other | Source: Ambulatory Visit | Attending: Orthopedic Surgery | Admitting: Orthopedic Surgery

## 2018-12-25 DIAGNOSIS — Z1159 Encounter for screening for other viral diseases: Secondary | ICD-10-CM | POA: Insufficient documentation

## 2018-12-25 DIAGNOSIS — Z01812 Encounter for preprocedural laboratory examination: Secondary | ICD-10-CM | POA: Diagnosis present

## 2018-12-26 ENCOUNTER — Encounter (HOSPITAL_BASED_OUTPATIENT_CLINIC_OR_DEPARTMENT_OTHER): Payer: Self-pay | Admitting: *Deleted

## 2018-12-26 ENCOUNTER — Other Ambulatory Visit: Payer: Self-pay

## 2018-12-26 LAB — SARS CORONAVIRUS 2 (TAT 6-24 HRS): SARS Coronavirus 2: NEGATIVE

## 2018-12-26 NOTE — Progress Notes (Signed)
Spoke w/ pt via phone for pre-op interview.  Npo after mn w/ exception clear liquids until 0900 then nothing by mouth, pt verbalized understanding. Arrive at 1000.  Needs istat 8 and ekg.  Will take cymbalta, allopurinol, norvasc, crestor, synthroid, and pepcid am dos w/ sips of water.  Pt had covid test done yesterday.

## 2018-12-26 NOTE — Progress Notes (Signed)

## 2018-12-29 ENCOUNTER — Ambulatory Visit (HOSPITAL_BASED_OUTPATIENT_CLINIC_OR_DEPARTMENT_OTHER): Payer: Medicare Other | Admitting: Anesthesiology

## 2018-12-29 ENCOUNTER — Ambulatory Visit (HOSPITAL_BASED_OUTPATIENT_CLINIC_OR_DEPARTMENT_OTHER)
Admission: RE | Admit: 2018-12-29 | Discharge: 2018-12-29 | Disposition: A | Discharge status: Home / Self Care | Payer: Medicare Other | Attending: Orthopedic Surgery | Admitting: Orthopedic Surgery

## 2018-12-29 ENCOUNTER — Other Ambulatory Visit: Payer: Self-pay

## 2018-12-29 ENCOUNTER — Encounter (HOSPITAL_BASED_OUTPATIENT_CLINIC_OR_DEPARTMENT_OTHER)
Admission: RE | Disposition: A | Discharge status: Home / Self Care | Payer: Self-pay | Source: Home / Self Care | Attending: Orthopedic Surgery

## 2018-12-29 ENCOUNTER — Encounter (HOSPITAL_BASED_OUTPATIENT_CLINIC_OR_DEPARTMENT_OTHER): Payer: Self-pay | Admitting: Anesthesiology

## 2018-12-29 DIAGNOSIS — Z7982 Long term (current) use of aspirin: Secondary | ICD-10-CM | POA: Insufficient documentation

## 2018-12-29 DIAGNOSIS — M25811 Other specified joint disorders, right shoulder: Secondary | ICD-10-CM | POA: Diagnosis not present

## 2018-12-29 DIAGNOSIS — G4733 Obstructive sleep apnea (adult) (pediatric): Secondary | ICD-10-CM | POA: Diagnosis not present

## 2018-12-29 DIAGNOSIS — Z85828 Personal history of other malignant neoplasm of skin: Secondary | ICD-10-CM | POA: Diagnosis not present

## 2018-12-29 DIAGNOSIS — R531 Weakness: Secondary | ICD-10-CM | POA: Diagnosis not present

## 2018-12-29 DIAGNOSIS — R7303 Prediabetes: Secondary | ICD-10-CM | POA: Insufficient documentation

## 2018-12-29 DIAGNOSIS — Z87891 Personal history of nicotine dependence: Secondary | ICD-10-CM | POA: Diagnosis not present

## 2018-12-29 DIAGNOSIS — R351 Nocturia: Secondary | ICD-10-CM | POA: Diagnosis not present

## 2018-12-29 DIAGNOSIS — Z791 Long term (current) use of non-steroidal anti-inflammatories (NSAID): Secondary | ICD-10-CM | POA: Insufficient documentation

## 2018-12-29 DIAGNOSIS — K219 Gastro-esophageal reflux disease without esophagitis: Secondary | ICD-10-CM | POA: Diagnosis not present

## 2018-12-29 DIAGNOSIS — I2510 Atherosclerotic heart disease of native coronary artery without angina pectoris: Secondary | ICD-10-CM | POA: Diagnosis not present

## 2018-12-29 DIAGNOSIS — F419 Anxiety disorder, unspecified: Secondary | ICD-10-CM | POA: Diagnosis not present

## 2018-12-29 DIAGNOSIS — F329 Major depressive disorder, single episode, unspecified: Secondary | ICD-10-CM | POA: Diagnosis not present

## 2018-12-29 DIAGNOSIS — Z79899 Other long term (current) drug therapy: Secondary | ICD-10-CM | POA: Insufficient documentation

## 2018-12-29 DIAGNOSIS — X58XXXA Exposure to other specified factors, initial encounter: Secondary | ICD-10-CM | POA: Insufficient documentation

## 2018-12-29 DIAGNOSIS — M75101 Unspecified rotator cuff tear or rupture of right shoulder, not specified as traumatic: Secondary | ICD-10-CM | POA: Insufficient documentation

## 2018-12-29 DIAGNOSIS — E785 Hyperlipidemia, unspecified: Secondary | ICD-10-CM | POA: Insufficient documentation

## 2018-12-29 DIAGNOSIS — M1990 Unspecified osteoarthritis, unspecified site: Secondary | ICD-10-CM | POA: Insufficient documentation

## 2018-12-29 DIAGNOSIS — M109 Gout, unspecified: Secondary | ICD-10-CM | POA: Diagnosis not present

## 2018-12-29 DIAGNOSIS — E039 Hypothyroidism, unspecified: Secondary | ICD-10-CM | POA: Insufficient documentation

## 2018-12-29 DIAGNOSIS — I10 Essential (primary) hypertension: Secondary | ICD-10-CM | POA: Diagnosis not present

## 2018-12-29 DIAGNOSIS — M25462 Effusion, left knee: Secondary | ICD-10-CM | POA: Diagnosis present

## 2018-12-29 DIAGNOSIS — Z882 Allergy status to sulfonamides status: Secondary | ICD-10-CM | POA: Diagnosis not present

## 2018-12-29 DIAGNOSIS — S46191A Other injury of muscle, fascia and tendon of long head of biceps, right arm, initial encounter: Secondary | ICD-10-CM | POA: Insufficient documentation

## 2018-12-29 HISTORY — DX: Obstructive sleep apnea (adult) (pediatric): G47.33

## 2018-12-29 HISTORY — DX: Atherosclerotic heart disease of native coronary artery without angina pectoris: I25.10

## 2018-12-29 HISTORY — DX: Unspecified disorder of synovium and tendon, right upper arm: M67.921

## 2018-12-29 HISTORY — DX: Hyperlipidemia, unspecified: E78.5

## 2018-12-29 HISTORY — DX: Nocturia: R35.1

## 2018-12-29 HISTORY — DX: Gout, unspecified: M10.9

## 2018-12-29 HISTORY — DX: Unspecified osteoarthritis, unspecified site: M19.90

## 2018-12-29 HISTORY — PX: INJECTION KNEE: SHX2446

## 2018-12-29 HISTORY — DX: Unspecified rotator cuff tear or rupture of right shoulder, not specified as traumatic: M75.101

## 2018-12-29 HISTORY — PX: SHOULDER ARTHROSCOPY WITH ROTATOR CUFF REPAIR: SHX5685

## 2018-12-29 LAB — POCT I-STAT, CHEM 8
BUN: 23 mg/dL (ref 8–23)
Calcium, Ion: 1.23 mmol/L (ref 1.15–1.40)
Chloride: 101 mmol/L (ref 98–111)
Creatinine, Ser: 0.8 mg/dL (ref 0.61–1.24)
Glucose, Bld: 111 mg/dL — ABNORMAL HIGH (ref 70–99)
HCT: 39 % (ref 39.0–52.0)
Hemoglobin: 13.3 g/dL (ref 13.0–17.0)
Potassium: 3.8 mmol/L (ref 3.5–5.1)
Sodium: 137 mmol/L (ref 135–145)
TCO2: 26 mmol/L (ref 22–32)

## 2018-12-29 SURGERY — ARTHROSCOPY, SHOULDER, WITH ROTATOR CUFF REPAIR
Anesthesia: General | Laterality: Right

## 2018-12-29 MED ORDER — PROPOFOL 10 MG/ML IV BOLUS
INTRAVENOUS | Status: DC | PRN
  Administered 2018-12-29: 200 mg via INTRAVENOUS

## 2018-12-29 MED ORDER — BUPIVACAINE HCL (PF) 0.5 % IJ SOLN
INTRAMUSCULAR | Status: DC | PRN
  Administered 2018-12-29: 20 mL via PERINEURAL

## 2018-12-29 MED ORDER — FENTANYL CITRATE (PF) 100 MCG/2ML IJ SOLN
INTRAMUSCULAR | Status: DC | PRN
  Administered 2018-12-29 (×4): 25 ug via INTRAVENOUS

## 2018-12-29 MED ORDER — SODIUM CHLORIDE 0.9 % IR SOLN
Status: DC | PRN
  Administered 2018-12-29: 12000 mL

## 2018-12-29 MED ORDER — BUPIVACAINE LIPOSOME 1.3 % IJ SUSP
INTRAMUSCULAR | Status: DC | PRN
  Administered 2018-12-29: 10 mL via PERINEURAL

## 2018-12-29 MED ORDER — PROPOFOL 10 MG/ML IV BOLUS
INTRAVENOUS | Status: AC
  Filled 2018-12-29: qty 40

## 2018-12-29 MED ORDER — ONDANSETRON HCL 4 MG/2ML IJ SOLN
INTRAMUSCULAR | Status: DC | PRN
  Administered 2018-12-29: 4 mg via INTRAVENOUS

## 2018-12-29 MED ORDER — CEFAZOLIN SODIUM-DEXTROSE 2-4 GM/100ML-% IV SOLN
INTRAVENOUS | Status: AC
  Filled 2018-12-29: qty 100

## 2018-12-29 MED ORDER — PROMETHAZINE HCL 25 MG/ML IJ SOLN
6.2500 mg | INTRAMUSCULAR | Status: DC | PRN
  Filled 2018-12-29: qty 1

## 2018-12-29 MED ORDER — OXYCODONE HCL 5 MG/5ML PO SOLN
5.0000 mg | Freq: Once | ORAL | Status: DC | PRN
  Filled 2018-12-29: qty 5

## 2018-12-29 MED ORDER — TRIAMCINOLONE ACETONIDE 40 MG/ML IJ SUSP
INTRAMUSCULAR | Status: DC | PRN
  Administered 2018-12-29: 40 mg via INTRA_ARTICULAR

## 2018-12-29 MED ORDER — TIZANIDINE HCL 4 MG PO TABS: 4.0000 mg | ORAL_TABLET | Freq: Three times a day (TID) | ORAL | 1 refills | Status: DC | PRN

## 2018-12-29 MED ORDER — DEXAMETHASONE SODIUM PHOSPHATE 4 MG/ML IJ SOLN
INTRAMUSCULAR | Status: DC | PRN
  Administered 2018-12-29: 10 mg via INTRAVENOUS

## 2018-12-29 MED ORDER — CHLORHEXIDINE GLUCONATE 4 % EX LIQD
60.0000 mL | Freq: Once | CUTANEOUS | Status: DC
  Filled 2018-12-29: qty 118

## 2018-12-29 MED ORDER — ONDANSETRON HCL 4 MG/2ML IJ SOLN
INTRAMUSCULAR | Status: AC
  Filled 2018-12-29: qty 2

## 2018-12-29 MED ORDER — OXYCODONE-ACETAMINOPHEN 5-325 MG PO TABS: 1.0000 | ORAL_TABLET | ORAL | 0 refills | Status: DC | PRN

## 2018-12-29 MED ORDER — EPHEDRINE 5 MG/ML INJ
INTRAVENOUS | Status: AC
  Filled 2018-12-29: qty 10

## 2018-12-29 MED ORDER — MIDAZOLAM HCL 2 MG/2ML IJ SOLN
2.0000 mg | Freq: Once | INTRAMUSCULAR | Status: AC
  Administered 2018-12-29: 2 mg via INTRAVENOUS
  Filled 2018-12-29: qty 2

## 2018-12-29 MED ORDER — FENTANYL CITRATE (PF) 100 MCG/2ML IJ SOLN
INTRAMUSCULAR | Status: AC
  Filled 2018-12-29: qty 2

## 2018-12-29 MED ORDER — FENTANYL CITRATE (PF) 100 MCG/2ML IJ SOLN
100.0000 ug | Freq: Once | INTRAMUSCULAR | Status: AC
  Administered 2018-12-29: 100 ug via INTRAVENOUS
  Filled 2018-12-29: qty 2

## 2018-12-29 MED ORDER — HYDROMORPHONE HCL 1 MG/ML IJ SOLN
0.2500 mg | INTRAMUSCULAR | Status: DC | PRN
  Filled 2018-12-29: qty 0.5

## 2018-12-29 MED ORDER — LIDOCAINE HCL (CARDIAC) PF 100 MG/5ML IV SOSY
PREFILLED_SYRINGE | INTRAVENOUS | Status: DC | PRN
  Administered 2018-12-29: 100 mg via INTRAVENOUS

## 2018-12-29 MED ORDER — CEFAZOLIN SODIUM-DEXTROSE 2-4 GM/100ML-% IV SOLN
2.0000 g | INTRAVENOUS | Status: AC
  Administered 2018-12-29: 2 g via INTRAVENOUS
  Filled 2018-12-29: qty 100

## 2018-12-29 MED ORDER — MEPERIDINE HCL 25 MG/ML IJ SOLN
6.2500 mg | INTRAMUSCULAR | Status: DC | PRN
  Filled 2018-12-29: qty 1

## 2018-12-29 MED ORDER — OXYCODONE HCL 5 MG PO TABS
5.0000 mg | ORAL_TABLET | Freq: Once | ORAL | Status: DC | PRN
  Filled 2018-12-29: qty 1

## 2018-12-29 MED ORDER — EPHEDRINE SULFATE 50 MG/ML IJ SOLN
INTRAMUSCULAR | Status: DC | PRN
  Administered 2018-12-29 (×3): 10 mg via INTRAVENOUS

## 2018-12-29 MED ORDER — LACTATED RINGERS IV SOLN
INTRAVENOUS | Status: DC
  Administered 2018-12-29: 50 mL/h via INTRAVENOUS
  Administered 2018-12-29: 13:00:00 via INTRAVENOUS
  Filled 2018-12-29: qty 1000

## 2018-12-29 MED ORDER — MIDAZOLAM HCL 2 MG/2ML IJ SOLN
INTRAMUSCULAR | Status: AC
  Filled 2018-12-29: qty 2

## 2018-12-29 MED ORDER — BUPIVACAINE HCL (PF) 0.5 % IJ SOLN
INTRAMUSCULAR | Status: DC | PRN
  Administered 2018-12-29: 7 mL via INTRA_ARTICULAR

## 2018-12-29 MED ORDER — DEXAMETHASONE SODIUM PHOSPHATE 10 MG/ML IJ SOLN
INTRAMUSCULAR | Status: AC
  Filled 2018-12-29: qty 1

## 2018-12-29 MED ORDER — LIDOCAINE 2% (20 MG/ML) 5 ML SYRINGE
INTRAMUSCULAR | Status: AC
  Filled 2018-12-29: qty 5

## 2018-12-29 SURGICAL SUPPLY — 66 items
AID PSTN UNV HD RSTRNT DISP (MISCELLANEOUS) ×2
ANCH SUT SWLK 19.1X4.75 VT (Anchor) ×12 IMPLANT
ANCHOR PEEK 4.75X19.1 SWLK C (Anchor) ×6 IMPLANT
APL PRP STRL LF DISP 70% ISPRP (MISCELLANEOUS) ×2
BNDG COHESIVE 4X5 TAN STRL (GAUZE/BANDAGES/DRESSINGS) ×3 IMPLANT
BURR OVAL 8 FLU 4.0X13 (MISCELLANEOUS) IMPLANT
CANNULA 5.75X7 CRYSTAL CLEAR (CANNULA) ×1 IMPLANT
CANNULA TWIST IN 8.25X7CM (CANNULA) ×1 IMPLANT
CHLORAPREP W/TINT 26 (MISCELLANEOUS) ×3 IMPLANT
COOLER ICEMAN CLASSIC (MISCELLANEOUS) ×1 IMPLANT
COVER WAND RF STERILE (DRAPES) IMPLANT
CUTTER BONE 4.0MM X 13CM (MISCELLANEOUS) ×1 IMPLANT
DECANTER SPIKE VIAL GLASS SM (MISCELLANEOUS) IMPLANT
DRAPE INCISE IOBAN 66X45 STRL (DRAPES) ×3 IMPLANT
DRAPE LG THREE QUARTER DISP (DRAPES) IMPLANT
DRAPE ORTHO SPLIT 87X125 STRL (DRAPES) ×6 IMPLANT
DRAPE STERI 35X30 U-POUCH (DRAPES) ×3 IMPLANT
DRAPE SURG 17X23 STRL (DRAPES) ×3 IMPLANT
DRAPE U-SHAPE 47X51 STRL (DRAPES) ×3 IMPLANT
DRAPE U-SHAPE 76X120 STRL (DRAPES) ×6 IMPLANT
DRSG PAD ABDOMINAL 8X10 ST (GAUZE/BANDAGES/DRESSINGS) ×3 IMPLANT
ELECT REM PT RETURN 9FT ADLT (ELECTROSURGICAL) ×3
ELECTRODE REM PT RTRN 9FT ADLT (ELECTROSURGICAL) ×2 IMPLANT
FIBER TAPE 2MM (SUTURE) ×2 IMPLANT
GAUZE SPONGE 4X4 12PLY STRL (GAUZE/BANDAGES/DRESSINGS) ×3 IMPLANT
GAUZE XEROFORM 1X8 LF (GAUZE/BANDAGES/DRESSINGS) ×3 IMPLANT
GLOVE BIO SURGEON STRL SZ7 (GLOVE) ×3 IMPLANT
GLOVE BIO SURGEON STRL SZ7.5 (GLOVE) ×6 IMPLANT
GLOVE BIOGEL PI IND STRL 7.0 (GLOVE) ×2 IMPLANT
GLOVE BIOGEL PI IND STRL 8 (GLOVE) ×4 IMPLANT
GLOVE BIOGEL PI INDICATOR 7.0 (GLOVE) ×1
GLOVE BIOGEL PI INDICATOR 8 (GLOVE) ×2
GOWN STRL REUS W/ TWL LRG LVL3 (GOWN DISPOSABLE) ×4 IMPLANT
GOWN STRL REUS W/TWL LRG LVL3 (GOWN DISPOSABLE) ×6
LASSO CRESCENT QUICKPASS (SUTURE) ×1 IMPLANT
MANIFOLD NEPTUNE II (INSTRUMENTS) ×3 IMPLANT
NDL 1/2 CIR CATGUT .05X1.09 (NEEDLE) IMPLANT
NDL SCORPION MULTI FIRE (NEEDLE) IMPLANT
NDL SUT 6 .5 CRC .975X.05 MAYO (NEEDLE) IMPLANT
NEEDLE 1/2 CIR CATGUT .05X1.09 (NEEDLE) ×3 IMPLANT
NEEDLE MAYO TAPER (NEEDLE) ×3
NEEDLE SCORPION MULTI FIRE (NEEDLE) ×3 IMPLANT
NS IRRIG 1000ML POUR BTL (IV SOLUTION) ×1 IMPLANT
PACK ARTHROSCOPY DSU (CUSTOM PROCEDURE TRAY) ×3 IMPLANT
PACK BASIN DAY SURGERY FS (CUSTOM PROCEDURE TRAY) ×3 IMPLANT
PAD ABD 8X10 STRL (GAUZE/BANDAGES/DRESSINGS) ×1 IMPLANT
PAD COLD SHLDR WRAP-ON (PAD) ×1 IMPLANT
PROBE BIPOLAR ATHRO 135MM 90D (MISCELLANEOUS) ×1 IMPLANT
RESTRAINT HEAD UNIVERSAL NS (MISCELLANEOUS) ×3 IMPLANT
SLEEVE SCD COMPRESS KNEE MED (MISCELLANEOUS) ×3 IMPLANT
SLING ARM FOAM STRAP LRG (SOFTGOODS) ×1 IMPLANT
SLING ARM IMMOBILIZER LRG (SOFTGOODS) ×1 IMPLANT
SLING ARM IMMOBILIZER MED (SOFTGOODS) IMPLANT
SLING ARM MED ADULT FOAM STRAP (SOFTGOODS) IMPLANT
SLING ARM XL FOAM STRAP (SOFTGOODS) IMPLANT
SUPPORT WRAP ARM LG (MISCELLANEOUS) IMPLANT
SUT ETHILON 3 0 PS 1 (SUTURE) ×3 IMPLANT
SUT PDS AB 0 CT 36 (SUTURE) IMPLANT
SUT TIGER TAPE 7 IN WHITE (SUTURE) ×1 IMPLANT
SUTURE TAPE 1.3 40 TPR END (SUTURE) IMPLANT
SUTURETAPE 1.3 40 TPR END (SUTURE)
TAPE CLOTH SURG 6X10 WHT LF (GAUZE/BANDAGES/DRESSINGS) ×1 IMPLANT
TAPE FIBER 2MM 7IN #2 BLUE (SUTURE) ×2 IMPLANT
TOWEL OR 17X26 10 PK STRL BLUE (TOWEL DISPOSABLE) ×3 IMPLANT
TUBING ARTHROSCOPY IRRIG 16FT (MISCELLANEOUS) ×3 IMPLANT
TUBING SUCTION 1/4X6FT (MISCELLANEOUS) ×3 IMPLANT

## 2018-12-29 NOTE — Transfer of Care (Signed)
Immediate Anesthesia Transfer of Care Note  Patient: Howard [name]  Procedure(s) Performed: SHOULDER ARTHROSCOPY WITH ROTATOR CUFF REPAIR and  SUBACROMIAL DECOMPRESSION AND BICEP TENOTOMY (Right ) LEFT KNEE ASPIRATION AND INJECTION (Left )  Patient Location: PACU  Anesthesia Type:GA combined with regional for post-op pain  Level of Consciousness: awake, alert , oriented and patient cooperative  Airway & Oxygen Therapy: Patient Spontanous Breathing and Patient connected to nasal cannula oxygen  Post-op Assessment: Report given to RN and Post -op Vital signs reviewed and stable  Post vital signs: Reviewed and stable  Last Vitals:  Vitals Value Taken Time  BP 124/75 12/29/18 1424  Temp 36.6 C 12/29/18 1425  Pulse 65 12/29/18 1427  Resp 8 12/29/18 1427  SpO2 97 % 12/29/18 1427  Vitals shown include unvalidated device data.  Last Pain:  Vitals:   12/29/18 1425  PainSc: 0-No pain      Patients Stated Pain Goal: 4 (22/57/50 5183)  Complications: No apparent anesthesia complications

## 2018-12-29 NOTE — Anesthesia Procedure Notes (Signed)
Anesthesia Regional Block: Interscalene brachial plexus block   Pre-Anesthetic Checklist: ,, timeout performed, Correct Patient, Correct Site, Correct Laterality, Correct Procedure, Correct Position, site marked, Risks and benefits discussed,  Surgical consent,  Pre-op evaluation,  At surgeon's request and post-op pain management  Laterality: Right  Prep: chloraprep       Needles:  Injection technique: Single-shot  Needle Type: Stimiplex     Needle Length: 9cm  Needle Gauge: 21     Additional Needles:   Procedures:,,,, ultrasound used (permanent image in chart),,,,  Narrative:  Start time: 12/29/2018 11:54 AM End time: 12/29/2018 11:59 AM Injection made incrementally with aspirations every 5 mL.  Performed by: Personally  Anesthesiologist: Lynda Rainwater, MD

## 2018-12-29 NOTE — Anesthesia Preprocedure Evaluation (Signed)
Anesthesia Evaluation  Patient identified by MRN, date of birth, ID band Patient awake    Reviewed: Allergy & Precautions, NPO status , Patient's Chart, lab work & pertinent test results  Airway Mallampati: II  TM Distance: <3 FB Neck ROM: Full    Dental no notable dental hx.    Pulmonary sleep apnea , former smoker,    Pulmonary exam normal breath sounds clear to auscultation       Cardiovascular hypertension, Pt. on medications Normal cardiovascular exam Rhythm:Regular Rate:Normal     Neuro/Psych Anxiety Depression negative neurological ROS  negative psych ROS   GI/Hepatic Neg liver ROS, GERD  ,  Endo/Other  Hypothyroidism   Renal/GU negative Renal ROS  negative genitourinary   Musculoskeletal  (+) Arthritis , Osteoarthritis,    Abdominal (+) + obese,   Peds negative pediatric ROS (+)  Hematology negative hematology ROS (+)   Anesthesia Other Findings   Reproductive/Obstetrics negative OB ROS                             Anesthesia Physical  Anesthesia Plan  ASA: II  Anesthesia Plan: General   Post-op Pain Management:  Regional for Post-op pain   Induction: Intravenous  PONV Risk Score and Plan: 2 and Ondansetron and Midazolam  Airway Management Planned: LMA  Additional Equipment:   Intra-op Plan:   Post-operative Plan:   Informed Consent: I have reviewed the patients History and Physical, chart, labs and discussed the procedure including the risks, benefits and alternatives for the proposed anesthesia with the patient or authorized representative who has indicated his/her understanding and acceptance.     Dental advisory given  Plan Discussed with: CRNA and Surgeon  Anesthesia Plan Comments:         Anesthesia Quick Evaluation

## 2018-12-29 NOTE — Progress Notes (Signed)
Assisted Dr. Miller with right, ultrasound guided, interscalene  block. Side rails up, monitors on throughout procedure. See vital signs in flow sheet. Tolerated Procedure well. 

## 2018-12-29 NOTE — Discharge Instructions (Signed)
Discharge Instructions after Arthroscopic Shoulder Repair   A sling has been provided for you. Remain in your sling at all times. This includes sleeping in your sling.  Use ice on the shoulder intermittently over the first 48 hours after surgery.  Pain medicine has been prescribed for you.  Use your medicine liberally over the first 48 hours, and then you can begin to taper your use. You may take Extra Strength Tylenol or Tylenol only in place of the pain pills. DO NOT take ANY nonsteroidal anti-inflammatory pain medications: Advil, Motrin, Ibuprofen, Aleve, Naproxen, or Narprosyn.  You may remove your dressing after two days. If the incision sites are still moist, place a Band-Aid over the moist site(s). Change Band-Aids daily until dry.  You may shower 5 days after surgery. The incisions CANNOT get wet prior to 5 days. Simply allow the water to wash over the site and then pat dry. Do not rub the incisions. Make sure your axilla (armpit) is completely dry after showering.  Take one aspirin a day for 2 weeks after surgery, unless you have an aspirin sensitivity/ allergy or asthma.   Please call [phone] during normal business hours or [phone] after hours for any problems. Including the following:  - excessive redness of the incisions - drainage for more than 4 days - fever of more than 101.5 F  *Please note that pain medications will not be refilled after hours or on weekends.   Notify your surgeon if you have any changes in circulation to your hand on the right side.such as blue nailbeds or hand/fingres feeling cold to touch.  Information for Discharge Teaching: EXPAREL (bupivacaine liposome injectable suspension)   Your surgeon or anesthesiologist gave you EXPAREL(bupivacaine) to help control your pain after surgery.   EXPAREL is a local anesthetic that provides pain relief by numbing the tissue around the surgical site.  EXPAREL is designed to release pain medication over  time and can control pain for up to 72 hours.  Depending on how you respond to EXPAREL, you may require less pain medication during your recovery.  Possible side effects:  Temporary loss of sensation or ability to move in the area where bupivacaine was injected.  Nausea, vomiting, constipation  Rarely, numbness and tingling in your mouth or lips, lightheadedness, or anxiety may occur.  Call your doctor right away if you think you may be experiencing any of these sensations, or if you have other questions regarding possible side effects.  Follow all other discharge instructions given to you by your surgeon or nurse. Eat a healthy diet and drink plenty of water or other fluids.  If you return to the hospital for any reason within 96 hours following the administration of EXPAREL, it is important for health care providers to know that you have received this anesthetic. A teal colored band has been placed on your arm with the date, time and amount of EXPAREL you have received in order to alert and inform your health care providers. Please leave this armband in place for the full 96 hours following administration, and then you may remove the band.  May remove green armband Thursday January 01, 2019.   Post Anesthesia Home Care Instructions  Activity: Get plenty of rest for the remainder of the day. A responsible individual must stay with you for 24 hours following the procedure.  For the next 24 hours, DO NOT: -Drive a car -Paediatric nurse -Drink alcoholic beverages -Take any medication unless instructed by your physician -Make any  legal decisions or sign important papers.  Meals: Start with liquid foods such as gelatin or soup. Progress to regular foods as tolerated. Avoid greasy, spicy, heavy foods. If nausea and/or vomiting occur, drink only clear liquids until the nausea and/or vomiting subsides. Call your physician if vomiting continues.  Special Instructions/Symptoms: Your throat  may feel dry or sore from the anesthesia or the breathing tube placed in your throat during surgery. If this causes discomfort, gargle with warm salt water. The discomfort should disappear within 24 hours.

## 2018-12-29 NOTE — H&P (Signed)
Howard [name] is an 63 y.o. male.   Chief Complaint: Right shoulder pain and weakness, left knee swelling HPI: 63 year old with right shoulder rotator cuff tear, failed conservative management.  Also has a history of multiple surgeries in the left knee with recurrent swelling which is gotten a lot worse recently.  He asked that we aspirate and inject the knee while he is asleep to help with the recovery for his shoulder.  Past Medical History:  Diagnosis Date  . Anxiety   . Borderline diabetes   . Coronary artery disease    cardiologist-  dr Marlou Porch--- coronary CT 09-11-2017 showed non-obstructive mild diffuse cad RCA and ostial LAD and moderate midLAD,    . Disorder of tendon of right biceps    dislocation  . Family history of adverse reaction to anesthesia    FATHER REACTED TO LIDOCAINE   . GERD (gastroesophageal reflux disease)   . Gout    per pt stable  . History of esophageal spasm 2008  . History of nonmelanoma skin cancer   . History of nuclear stress test 02/17/2009   normal , no ishemia w/ ef 60%  . Hyperlipidemia   . Hypertension   . Hypogonadism male    followed by pcp  . Hypothyroidism    followed by pcp  . Nocturia   . OA (osteoarthritis)   . OSA (obstructive sleep apnea)    per pt has not used for severeal yrs, currently using mouth appliance  . Right rotator cuff tear     Past Surgical History:  Procedure Laterality Date  . ANAL FISSURE REPAIR  yrs ago  . CARPAL TUNNEL RELEASE  2012 /2013   bil  . CATARACT EXTRACTION W/ INTRAOCULAR LENS  IMPLANT, BILATERAL  june 9 & 16-- 2020  . KNEE ARTHROSCOPY  12/12/2011   Procedure: ARTHROSCOPY KNEE;  Surgeon: Gearlean Alf, MD;  Location: [facility];  Service: Orthopedics;  Laterality: Left;  medial menisectomy and chondroplasty  . KNEE ARTHROSCOPY Left 10/29/2012   Procedure: LEFT ARTHROSCOPY KNEE WITH CHONDROPLASTY AND DEBRIDMENT OF MENISCUS;  Surgeon: Gearlean Alf, MD;  Location: [facility];  Service: Orthopedics;   Laterality: Left;  . KNEE SURGERY Bilateral right 1973 /  left 1985 /   right 1993  . PLANTAR FASCIA SURGERY Left 08-02-2008    dr Beola Cord  @[facility]   . SHOULDER ARTHROSCOPY WITH ROTATOR CUFF REPAIR AND SUBACROMIAL DECOMPRESSION Right 04-09-2009   dr supple  @[facility]    w/ labral debridement and dcr  . TOTAL KNEE ARTHROPLASTY Right 07/05/2014   Procedure: RIGHT TOTAL KNEE ARTHROPLASTY;  Surgeon: Gearlean Alf, MD;  Location: [facility];  Service: Orthopedics;  Laterality: Right;    Family History  Problem Relation Age of Onset  . Heart disease Father   . Hypertension Father    Social History:  reports that he quit smoking about 18 years ago. His smoking use included cigarettes. He quit after 30.00 years of use. He has never used smokeless tobacco. He reports current alcohol use. He reports that he does not use drugs.  Allergies:  Allergies  Allergen Reactions  . Sulfa Antibiotics Rash    Medications Prior to Admission  Medication Sig Dispense Refill  . acetaminophen (TYLENOL) 650 MG CR tablet Take 650 mg by mouth every 8 (eight) hours as needed for pain.    Marland Kitchen allopurinol (ZYLOPRIM) 300 MG tablet Take 300 mg by mouth every morning.     Marland Kitchen amLODipine (NORVASC) 5 MG tablet Take 5 mg  by mouth daily.    Marland Kitchen aspirin EC 81 MG tablet Take 81 mg by mouth daily.    . celecoxib (CELEBREX) 200 MG capsule Take 200 mg by mouth daily.    . CRESTOR 20 MG tablet Take 10 mg by mouth daily.   0  . DULoxetine (CYMBALTA) 60 MG capsule Take 60 mg by mouth every morning.    . famotidine (PEPCID) 20 MG tablet Take 20 mg by mouth daily.     Marland Kitchen levothyroxine (SYNTHROID, LEVOTHROID) 175 MCG tablet Take 175 mcg by mouth every morning.     . Multiple Vitamin (MULTIVITAMIN) tablet Take 1 tablet by mouth daily.    . Omega-3 Fatty Acids (FISH OIL) 1000 MG CAPS Take by mouth.    . testosterone (ANDROGEL) 50 MG/5GM (1%) GEL Place 5 g onto the skin 2 (two) times daily.    . valsartan-hydrochlorothiazide (DIOVAN-HCT) 320-25 MG per  tablet Take 1 tablet by mouth every morning.       Results for orders placed or performed during the hospital encounter of 12/29/18 (from the past 48 hour(s))  I-STAT, chem 8     Status: Abnormal   Collection Time: 12/29/18 11:03 AM  Result Value Ref Range   Sodium 137 135 - 145 mmol/L   Potassium 3.8 3.5 - 5.1 mmol/L   Chloride 101 98 - 111 mmol/L   BUN 23 8 - 23 mg/dL   Creatinine, Ser 0.80 0.61 - 1.24 mg/dL   Glucose, Bld 111 (H) 70 - 99 mg/dL   Calcium, Ion 1.23 1.15 - 1.40 mmol/L   TCO2 26 22 - 32 mmol/L   Hemoglobin 13.3 13.0 - 17.0 g/dL   HCT 39.0 39.0 - 52.0 %   No results found.  Review of Systems  All other systems reviewed and are negative.   Blood pressure (!) 148/78, pulse 75, temperature 98.1 F (36.7 C), resp. rate 19, height 6\' 2"  (1.88 m), weight 117.4 kg, SpO2 99 %. Physical Exam  Constitutional: He is oriented to person, place, and time. He appears well-developed and well-nourished.  HENT:  Head: Atraumatic.  Eyes: EOM are normal.  Cardiovascular: Intact distal pulses.  Respiratory: Effort normal.  Musculoskeletal:     Comments: Right shoulder pain and weakness with rotator cuff testing.  Left knee large effusion.  No warmth erythema.  Neurological: He is alert and oriented to person, place, and time.  Skin: Skin is warm and dry.  Psychiatric: He has a normal mood and affect.     Assessment/Plan 63 year old with right shoulder rotator cuff tear, failed conservative management.  Also has a history of multiple surgeries in the left knee with recurrent swelling which is gotten a lot worse recently.  He asked that we aspirate and inject the knee while he is asleep to help with the recovery for his shoulder.  Plan right shoulder arthroscopic rotator cuff repair, subacromial decompression likely biceps tenotomy.  We will also aspirate and inject the left knee while he is asleep.  Isabella Stalling, MD 12/29/2018, 12:21 PM

## 2018-12-29 NOTE — Anesthesia Procedure Notes (Signed)
Procedure Name: LMA Insertion Date/Time: 12/29/2018 12:34 PM Performed by: Justice Rocher, CRNA Pre-anesthesia Checklist: Patient identified, Emergency Drugs available, Suction available and Patient being monitored Patient Re-evaluated:Patient Re-evaluated prior to induction Oxygen Delivery Method: Circle system utilized Preoxygenation: Pre-oxygenation with 100% oxygen Induction Type: IV induction Ventilation: Mask ventilation without difficulty LMA: LMA inserted LMA Size: 5.0 Number of attempts: 1 Airway Equipment and Method: Bite block Placement Confirmation: positive ETCO2 and breath sounds checked- equal and bilateral Tube secured with: Tape Dental Injury: Teeth and Oropharynx as per pre-operative assessment

## 2018-12-29 NOTE — Op Note (Signed)
Procedure(s): SHOULDER ARTHROSCOPY WITH ROTATOR CUFF REPAIR and  SUBACROMIAL DECOMPRESSION AND BICEP TENOTOMY LEFT KNEE ASPIRATION AND INJECTION Procedure Note  Howard [name] male 63 y.o. 12/29/2018   Preoperative diagnosis: #1 right shoulder large rotator cuff tear #2 right shoulder dislocated biceps tendon #3 right shoulder impingement #4 left knee effusion  Postoperative diagnosis: Same  Procedure(s) and Anesthesia Type:    * SHOULDER ARTHROSCOPY WITH ROTATOR CUFF REPAIR and  SUBACROMIAL DECOMPRESSION AND BICEP TENOTOMY - Choice    * LEFT KNEE ASPIRATION AND INJECTION  Surgeon(s) and Role:    Tania Ade, MD - Primary     Surgeon: Isabella Stalling   Assistants: Jeanmarie Hubert PA-C (Danielle was present and scrubbed throughout the procedure and was essential in positioning, assisting with the camera and instrumentation,, and closure)  Anesthesia: General endotracheal anesthesia with preoperative interscalene block given by the attending anesthesiologist      Procedure Detail  SHOULDER ARTHROSCOPY WITH ROTATOR CUFF REPAIR and  SUBACROMIAL DECOMPRESSION AND BICEP TENOTOMY, LEFT KNEE ASPIRATION AND INJECTION  Estimated Blood Loss: Min         Drains: none  Blood Given: none         Specimens: none        Complications:  * No complications entered in OR log *         Disposition: PACU - hemodynamically stable.         Condition: stable    Procedure:   INDICATIONS FOR SURGERY: The patient is 63 y.o. male who status post injury to the right shoulder with right shoulder large rotator cuff tear with dislocation of the biceps tendon.  Indicated for surgical treatment to restore function and decrease pain.  He also mention to me today in the preoperative holding area that he has a significant knee effusion which has required aspiration in the past and asked that we do that while he is in the operating room to try and help with his recovery of his  shoulder and decrease his fall risk.  OPERATIVE FINDINGS: Examination under anesthesia: No laxity or instability.   DESCRIPTION OF PROCEDURE: The patient was identified in preoperative  holding area where I personally marked the operative site after  verifying site, side, and procedure with the patient. An interscalene block was given by the attending anesthesiologist the holding area.  The patient was taken back to the operating room where general anesthesia was induced without complication.  Attention was first turned to the left knee where the superolateral portal position was sterilized with alcohol.  An 18-gauge needle was used to enter the joint and 130 cc of clear yellow joint fluid was withdrawn easily.  The knee was then injected with 1 cc 40 mg Kenalog with 7 cc quarter percent Marcaine without epinephrine.  A light sterile bandage was applied.  He was then placed in the beach-chair position with the back  elevated about 60 degrees and all extremities and head and neck carefully padded and  positioned.   The right upper extremity was then prepped and  draped in a standard sterile fashion. The appropriate time-out  procedure was carried out. The patient did receive IV antibiotics  within 30 minutes of incision.   A small posterior portal incision was made and the arthroscope was introduced into the joint. An anterior portal was then established above the subscapularis using needle localization. Small cannula was placed anteriorly. Diagnostic arthroscopy was then carried out.  Glenohumeral joint surfaces were intact without significant  chondromalacia.  The biceps tendon was noted to be dislocated into a significant tear of the entire tendinous portion of the subscapularis.  Through a small anterior incision curved Mayo scissors were used to release the biceps tendon from the superior labrum and it was allowed to retract from the joint.  Attention was then turned to the subscapularis  which was again noted to be completely torn from the tuberosity.  A lateral portal was established through the superior rotator cuff tear and a grasper was used to mobilize the subscapularis while adhesions were freed from the anterior cannula.  Once adequately mobilized it was easily repairable back to the lesser tuberosity.  This was prepared with a bur down to bleeding bony surface.  The repair was then carried out using one fiber tape in an inverted mattress configuration into a 4.75 peek swivel lock anchor in the lesser tuberosity.  This gave a nice anatomic reconstruction of the subscapularis.     The arthroscope was then introduced into the subacromial space a standard lateral portal was established with needle localization. The shaver was used through the lateral portal to perform extensive bursectomy.   The bursal surface of the rotator cuff tear was identified and debrided back to healthy tendon.  The tear involve the entire supraspinatus and infraspinatus.  The tuberosity was debrided down to a bleeding bony surface to promote healing and the repair was then carried out by placing 3 4.75 peek swivel lock anchors just off the articular margin which were preloaded with fiber tape.  The 6 suture strands were then passed evenly throughout the tear and brought over to 2 additional 4.75 peek swivel lock anchors in the lateral row bringing the tendon down nicely over the tuberosity.  The coracoacromial ligament was taken down off the anterior acromion with the ArthroCare exposing a small residual anterior acromial spur. A high-speed bur was then used through the lateral portal to take down the anterior acromial spur from lateral to medial in a standard acromioplasty.  The acromioplasty was also viewed from the lateral portal and the bur was used as necessary to ensure that the acromion was completely flat from posterior to anterior.   The arthroscopic equipment was removed from the joint and the  portals were closed with 3-0 nylon in an interrupted fashion. Sterile dressings were then applied including Xeroform 4 x 4's ABDs and tape. The patient was then allowed to awaken from general anesthesia, placed in a sling, transferred to the stretcher and taken to the recovery room in stable condition.   POSTOPERATIVE PLAN: The patient will be discharged home today and will followup in one week for suture removal and wound check.  He will follow the massive cuff protocol.

## 2018-12-29 NOTE — Anesthesia Postprocedure Evaluation (Signed)
Anesthesia Post Note  Patient: Howard [name]  Procedure(s) Performed: SHOULDER ARTHROSCOPY WITH ROTATOR CUFF REPAIR and  SUBACROMIAL DECOMPRESSION AND BICEP TENOTOMY (Right ) LEFT KNEE ASPIRATION AND INJECTION (Left )     Patient location during evaluation: PACU Anesthesia Type: General and Regional Level of consciousness: awake and alert, oriented and patient cooperative Pain management: pain level controlled Vital Signs Assessment: post-procedure vital signs reviewed and stable Respiratory status: spontaneous breathing, nonlabored ventilation and respiratory function stable Cardiovascular status: blood pressure returned to baseline and stable Postop Assessment: no apparent nausea or vomiting Anesthetic complications: no    Last Vitals:  Vitals:   12/29/18 1430 12/29/18 1445  BP: 127/81 135/80  Pulse: 68 73  Resp: 12 14  Temp:    SpO2: 97% 94%    Last Pain:  Vitals:   12/29/18 1425  PainSc: 0-No pain                 Vanita Cannell,E. Maquita Sandoval

## 2018-12-31 ENCOUNTER — Encounter (HOSPITAL_BASED_OUTPATIENT_CLINIC_OR_DEPARTMENT_OTHER): Payer: Self-pay | Admitting: Orthopedic Surgery

## 2019-04-14 ENCOUNTER — Ambulatory Visit (INDEPENDENT_AMBULATORY_CARE_PROVIDER_SITE_OTHER): Payer: Medicare Other | Admitting: Neurology

## 2019-04-14 ENCOUNTER — Encounter: Payer: Self-pay | Admitting: Neurology

## 2019-04-14 ENCOUNTER — Other Ambulatory Visit: Payer: Self-pay

## 2019-04-14 VITALS — BP 157/91 | HR 70 | Temp 97.3°F | Ht 74.0 in | Wt 261.0 lb

## 2019-04-14 DIAGNOSIS — E669 Obesity, unspecified: Secondary | ICD-10-CM

## 2019-04-14 DIAGNOSIS — G4733 Obstructive sleep apnea (adult) (pediatric): Secondary | ICD-10-CM

## 2019-04-14 DIAGNOSIS — G4719 Other hypersomnia: Secondary | ICD-10-CM | POA: Diagnosis not present

## 2019-04-14 DIAGNOSIS — I209 Angina pectoris, unspecified: Secondary | ICD-10-CM

## 2019-04-14 DIAGNOSIS — E66812 Obesity, class 2: Secondary | ICD-10-CM | POA: Insufficient documentation

## 2019-04-14 DIAGNOSIS — Z6837 Body mass index (BMI) 37.0-37.9, adult: Secondary | ICD-10-CM

## 2019-04-14 DIAGNOSIS — I739 Peripheral vascular disease, unspecified: Secondary | ICD-10-CM

## 2019-04-14 NOTE — Patient Instructions (Signed)

## 2019-04-14 NOTE — Progress Notes (Signed)
SLEEP MEDICINE CLINIC    Provider:  Larey Seat, MD  Primary Care Physician:  Crist Infante, MD [place] [address]     Referring Provider: Crist Infante, [address] [place],  [place] [address]          Chief Complaint according to patient   Patient presents with:     New Patient (Initial Visit)           HISTORY OF PRESENT ILLNESS:  Howard [name] is a 63 y.o. year old Caucasian male patient seen on 04/14/2019 upon referral from Dr. Joylene Draft for a sleep consultation.  Chief concern according to patient : " I have a dental device but I am very sleepy"    I have the pleasure of seeing Howard [name] today, a right-handed Caucasian male with a possible sleep disorder. He  has a past medical history of Anxiety, Borderline diabetes, Coronary artery disease, Disorder of tendon of right biceps, Family history of adverse reaction to anesthesia, GERD (gastroesophageal reflux disease), Gout, History of esophageal spasm (2008), History of non-melanoma ( basalioma)  skin cancer, History of nuclear stress test (02/17/2009), Hyperlipidemia, Hypertension, Hypogonadism male, Hypothyroidism, Nocturia, OA (osteoarthritis), OSA (obstructive sleep apnea), and Right rotator cuff tear.   The patient had the first sleep study in the year 2010 at [facility] - severe apnea) - and later a home study through his dentist,  with a result of an AHI ( Apnea Hypopnea index)  Indicating OSA. He has been using a dental device since , but feels he is sleepier again.   Sleep relevant medical history: former smoker, shift worker, gout and arthritis pain, neck and toe, shoulder rotator cuff injuries, knee replacement with pain.  Nocturia twice-  No Tonsillectomy, cervical spine trauma, deviated septum repair. Family medical /sleep history: no other family member on CPAP with OSA, father was a snorer.    Social history: [name] worked for [employer], Patient  is retired from EMS/ stroke  and lives in a household with 2 persons. Family status is married with 2 adult sons, 46 and 58, and grandchildren. Both sons out of state.  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 Tobacco use: former , 25 years.  ETOH use: socially, 3/ week  Caffeine intake in form of Coffee( 3 cups  ) Soda( some ) Tea ( at outside dinner) or energy drinks. Regular exercise in form of walking/ in PT for the shoulder .    Sleep habits are as follows: The patient's dinner time is between 6- 6.30 PM. He watches Tv and often is asleep in the den in a recliner by 9.30/ The patient goes to bed at 11 PM and continues to sleep for several  hours, wakes for bathroom breaks, the first time at 2-3 AM and again 4-5 AM. .   The preferred sleep position is left side ( caveat is shoulder pain) , with the support of 1 pillow. Dreams are reportedly frequent , but not acting out. He has called out in his sleep- rarely. 7 AM is the usual rise time.  The patient wakes up spontaneously.  Gets 8 hours of sleep- but reports not feeling refreshed or restored in AM, with symptoms such as dry mouth, no morning headaches, and residual fatigue.  Naps are taken after lunch  infrequently, lasting from 30 to 45 minutes and are more refreshing than nocturnal  sleep.    Review of Systems: Out of a complete 14 system review, the patient complains of only the following symptoms, and all other reviewed systems are negative.:   " I can dal asleep whenever not stimulated or physically active, long distance driving to [place] makes me sleepy.  Fatigue, sleepiness-  Snoring with the dental device.   How likely are you to doze in the following situations: 0 = not likely, 1 = slight chance, 2 = moderate chance, 3 = high chance   Sitting and Reading? Watching Television? Sitting inactive in a public place (theater or meeting)? As a passenger in a car for an hour  without a break? Lying down in the afternoon when circumstances permit? Sitting and talking to someone? Sitting quietly after lunch without alcohol? In a car, while stopped for a few minutes in traffic?   Total = 17 (!)/ 24 points   FSS endorsed at 52/ 63 points.   Social History   Socioeconomic History   Marital status: Married    Spouse name: Not on file   Number of children: Not on file   Years of education: Not on file   Highest education level: Not on file  Occupational History   Not on file  Social Needs   Financial resource strain: Not on file   Food insecurity    Worry: Not on file    Inability: Not on file   Transportation needs    Medical: Not on file    Non-medical: Not on file  Tobacco Use   Smoking status: Former Smoker    Years: 30.00    Types: Cigarettes    Quit date: 12/05/2000    Years since quitting: 18.3   Smokeless tobacco: Never Used  Substance and Sexual Activity   Alcohol use: Yes    Comment: occassional   Drug use: Never   Sexual activity: Not on file  Lifestyle   Physical activity    Days per week: Not on file    Minutes per session: Not on file   Stress: Not on file  Relationships   Social connections    Talks on phone: Not on file    Gets together: Not on file    Attends religious service: Not on file    Active member of club or organization: Not on file    Attends meetings of clubs or organizations: Not on file    Relationship status: Not on file  Other Topics Concern   Not on file  Social History Narrative   Not on file    Family History  Problem Relation Age of Onset   Heart disease Father    Hypertension Father     Past Medical History:  Diagnosis Date   Anxiety    Borderline diabetes    Coronary artery disease    cardiologist-  dr Marlou Porch--- coronary CT 09-11-2017 showed non-obstructive mild diffuse cad RCA and ostial LAD and moderate midLAD,     Disorder of tendon of right biceps     dislocation   Family history of adverse reaction to anesthesia    FATHER REACTED TO LIDOCAINE    GERD (gastroesophageal reflux disease)    Gout    per pt stable   History of esophageal spasm 2008   History of nonmelanoma skin cancer    History of nuclear stress test 02/17/2009   normal , no ishemia w/ ef 60%   Hyperlipidemia    Hypertension    Hypogonadism male  followed by pcp   Hypothyroidism    followed by pcp   Nocturia    OA (osteoarthritis)    OSA (obstructive sleep apnea)    per pt has not used for severeal yrs, currently using mouth appliance   Right rotator cuff tear     Past Surgical History:  Procedure Laterality Date   ANAL FISSURE REPAIR  yrs ago   CARPAL TUNNEL RELEASE  2012 /2013   bil   CATARACT EXTRACTION W/ INTRAOCULAR LENS  IMPLANT, BILATERAL  june 9 & 16-- 2020   INJECTION KNEE Left 12/29/2018   Procedure: LEFT KNEE ASPIRATION AND INJECTION;  Surgeon: Tania Ade, MD;  Location: [facility];  Service: Orthopedics;  Laterality: Left;   KNEE ARTHROSCOPY  12/12/2011   Procedure: ARTHROSCOPY KNEE;  Surgeon: Gearlean Alf, MD;  Location: [facility];  Service: Orthopedics;  Laterality: Left;  medial menisectomy and chondroplasty   KNEE ARTHROSCOPY Left 10/29/2012   Procedure: LEFT ARTHROSCOPY KNEE WITH CHONDROPLASTY AND DEBRIDMENT OF MENISCUS;  Surgeon: Gearlean Alf, MD;  Location: [facility];  Service: Orthopedics;  Laterality: Left;   KNEE SURGERY Bilateral right 1973 /  left 1985 /   right [place] Left 08-02-2008    dr Beola Cord  @[facility]    SHOULDER ARTHROSCOPY WITH ROTATOR CUFF REPAIR Right 12/29/2018   Procedure: SHOULDER ARTHROSCOPY WITH ROTATOR CUFF REPAIR and  SUBACROMIAL DECOMPRESSION AND BICEP TENOTOMY;  Surgeon: Tania Ade, MD;  Location: [facility];  Service: Orthopedics;  Laterality: Right;   SHOULDER ARTHROSCOPY WITH ROTATOR CUFF REPAIR AND SUBACROMIAL DECOMPRESSION Right  04-09-2009   dr supple  @[facility]    w/ labral debridement and dcr   TOTAL KNEE ARTHROPLASTY Right 07/05/2014   Procedure: RIGHT TOTAL KNEE ARTHROPLASTY;  Surgeon: Gearlean Alf, MD;  Location: [facility];  Service: Orthopedics;  Laterality: Right;     Current Outpatient Medications on File Prior to Visit  Medication Sig Dispense Refill   allopurinol (ZYLOPRIM) 300 MG tablet Take 300 mg by mouth every morning.      amLODipine (NORVASC) 5 MG tablet Take 5 mg by mouth daily.     aspirin EC 81 MG tablet Take 81 mg by mouth daily.     buPROPion (WELLBUTRIN XL) 150 MG 24 hr tablet bupropion HCl XL 150 mg 24 hr tablet, extended release     CRESTOR 20 MG tablet Take 10 mg by mouth daily.   0   DULoxetine (CYMBALTA) 60 MG capsule Take 60 mg by mouth every morning.     famotidine (PEPCID) 20 MG tablet Take 20 mg by mouth daily.      levothyroxine (SYNTHROID, LEVOTHROID) 175 MCG tablet Take 175 mcg by mouth every morning.      Multiple Vitamin (MULTIVITAMIN) tablet Take 1 tablet by mouth daily.     Omega-3 Fatty Acids (FISH OIL) 1000 MG CAPS Take by mouth.     testosterone (ANDROGEL) 50 MG/5GM (1%) GEL Place 5 g onto the skin 2 (two) times daily.     valsartan-hydrochlorothiazide (DIOVAN-HCT) 320-25 MG per tablet Take 1 tablet by mouth every morning.      No current facility-administered medications on file prior to visit.     Allergies  Allergen Reactions   Sulfa Antibiotics Rash    Physical exam:  There were no vitals filed for this visit. There is no height or weight on file to calculate BMI.   Wt Readings from Last 3 Encounters:  12/29/18 258 lb  14.4 oz (117.4 kg)  08/13/17 263 lb 12.8 oz (119.7 kg)  07/05/14 253 lb (114.8 kg)     Ht Readings from Last 3 Encounters:  12/26/18 6\' 2"  (1.88 m)  08/13/17 6\' 2"  (1.88 m)  07/05/14 6\' 2"  (1.88 m)      General: The patient is awake, alert and appears not in acute distress. The patient is well groomed. Head: Normocephalic,  atraumatic. Neck is supple. Mallampati 5,  neck circumference: 20 inches". Nasal airflow  patent.   Retrognathia is not  seen.  Dental status: intact  Cardiovascular:  Regular rate and cardiac rhythm by pulse,  without distended neck veins. Respiratory: Lungs are clear to auscultation.  Skin:  Without evidence of ankle edema, or rash. Trunk: The patient's posture is erect.   Neurologic exam : The patient is awake and alert, oriented to place and time.   Memory subjective described as intact.  Attention span & concentration ability appears normal.  Speech is fluent,  without  dysarthria, dysphonia or aphasia.  Mood and affect are appropriate.   Cranial nerves: no loss of smell or taste reported  Pupils are equal and briskly reactive to light. Funduscopic exam deferred.  Extraocular movements in vertical and horizontal planes were intact and without nystagmus. No Diplopia. Visual fields by finger perimetry are intact. Hearing was intact to soft voice and finger rubbing. Facial sensation intact to fine touch. Facial motor strength is symmetric and tongue and uvula move midline.  Neck ROM : rotation, tilt and flexion extension were normal for age and shoulder shrug was symmetrical.    Motor exam:  Symmetric bulk, tone and ROM.   Normal tone without cog wheeling, symmetric grip strength .   Sensory:  Fine touch, pinprick and vibration were tested  and  normal.  Proprioception tested in the upper extremities was normal.   Coordination: Rapid alternating movements in the fingers/hands were of normal speed.  The Finger-to-nose maneuver was intact without evidence of ataxia, dysmetria or tremor.   Gait and station: Patient could rise unassisted from a seated position, walked without assistive device.  Stance is of normal width/ base and the patient turned with 3 steps.  Toe and heel walk were deferred.  Deep tendon reflexes: in the  upper and lower extremities are symmetric and intact.    Babinski response was deferred.        After spending a total time of  40 minutes face to face and additional time for physical and neurologic examination, review of laboratory studies,  personal review of imaging studies, reports and results of other testing and review of referral information / records as far as provided in visit, I have established the following assessments:    1) He has all risk factors and is excessively daytime sleepy.  EDS is his main reason to be reevaluated.   2) OSA associated with Obesity and with high grade airway obstruction.    My Plan is to proceed with:  1)  like to repeat a sleep study- [employer] is not likely to cover an in lab sleep study- I will order both , HST and split.  He may need a Double Study to compare sleep with dental device and without. We need to re-establish  The degree of apnea at baseline and with dental device therapy. Or a split study in house.  He is willing to go to CPAP, BiPAP was used before.  2) he was air hungry on BiPAP, may need high pressure.  I would like to thank Crist Infante, MD [address] [place],  [place] [address] for allowing me to meet with and to take care of this pleasant patient.   In short, Howard [name] is presenting with EDS on dental device a symptom that can be attributed to undertreated OSA, or another sleep disorder causing Hypersomnia.    I plan to follow up either personally or through our NP within 2 month.   CC: I will share my notes with PCP,   Electronically signed by: Larey Seat, MD 04/14/2019 8:52 AM  [facility] Board certified by The [employer] of Sleep Medicine and Diplomate of the [employer] of Sleep Medicine. Board certified In Neurology through the [employer], Fellow of the [employer] of Neurology. Medical Director of [facility].

## 2019-04-26 ENCOUNTER — Ambulatory Visit (INDEPENDENT_AMBULATORY_CARE_PROVIDER_SITE_OTHER): Payer: Medicare Other | Admitting: Neurology

## 2019-04-26 DIAGNOSIS — G4719 Other hypersomnia: Secondary | ICD-10-CM

## 2019-04-26 DIAGNOSIS — I209 Angina pectoris, unspecified: Secondary | ICD-10-CM

## 2019-04-26 DIAGNOSIS — E669 Obesity, unspecified: Secondary | ICD-10-CM

## 2019-04-26 DIAGNOSIS — I739 Peripheral vascular disease, unspecified: Secondary | ICD-10-CM

## 2019-04-26 DIAGNOSIS — G4733 Obstructive sleep apnea (adult) (pediatric): Secondary | ICD-10-CM | POA: Diagnosis not present

## 2019-05-07 ENCOUNTER — Telehealth: Payer: Self-pay | Admitting: Neurology

## 2019-05-07 DIAGNOSIS — G4733 Obstructive sleep apnea (adult) (pediatric): Secondary | ICD-10-CM | POA: Insufficient documentation

## 2019-05-07 DIAGNOSIS — G4719 Other hypersomnia: Secondary | ICD-10-CM | POA: Insufficient documentation

## 2019-05-07 DIAGNOSIS — E669 Obesity, unspecified: Secondary | ICD-10-CM | POA: Insufficient documentation

## 2019-05-07 NOTE — Procedures (Signed)
PATIENT'S NAME:  Howard [name], Howard [name] DOB:      1955-07-11      MR#:    [id number]     DATE OF RECORDING: 04/26/2019 REFERRING M.D.:  Crist Infante, MD Study Performed:   Baseline Polysomnogram HISTORY:  This 63 year old Caucasian male patient was seen on 04/14/2019 upon referral from Dr. Joylene Draft for a sleep consultation.  Chief concern according to patient: " I have a dental device but I am still very sleepy"    I have the pleasure of seeing Howard [name] today, a right-handed Caucasian male with a possible sleep disorder. He  has a past medical history of Anxiety, Borderline diabetes, Coronary artery disease, Disorder of tendon of right biceps, Family history of adverse reaction to anesthesia, GERD (gastroesophageal reflux disease), Gout, History of esophageal spasm (2008), History of non-melanoma ( basalioma)  skin cancer, History of nuclear stress test (02/17/2009), Hyperlipidemia, Hypertension, Hypogonadism male, Hypothyroidism, Nocturia, OA (osteoarthritis), OSA (obstructive sleep apnea), and Right rotator cuff tear.   The patient had the first sleep study in the year 2010 at [facility] - severe apnea) - and later a home study through his dentist,  with a result of an AHI ( Apnea Hypopnea index)  Indicating OSA. He has been using a dental device since, but feels he is sleepier again.   Patient is a former smoker, shift worker ( EMS), has gout and arthritis pain, neck and toe, shoulder rotator cuff injuries, knee replacement with pain.  Nocturia twice-  No Tonsillectomy, cervical spine trauma, deviated septum repair.   The patient endorsed the Epworth Sleepiness Scale at 17 points.   The patient's weight 260 pounds with a height of 74 (inches), resulting in a BMI of 33.4 kg/m2. The patient's neck circumference measured 20 inches.  CURRENT MEDICATIONS: Zyloprim, Norvasc, Wellbutrin, Crestor, Cymbalta, Pepcid, Synthroid, Androgel, Diovan   PROCEDURE:  This is a multichannel  digital polysomnogram utilizing the Somnostar 11.2 system.  Electrodes and sensors were applied and monitored per AASM Specifications.   EEG, EOG, Chin and Limb EMG, were sampled at 200 Hz.  ECG, Snore and Nasal Pressure, Thermal Airflow, Respiratory Effort, CPAP Flow and Pressure, Oximetry was sampled at 50 Hz. Digital video and audio were recorded.      BASELINE STUDY: Lights Out was at 22:39 and Lights On at 04:39.  Total recording time (TRT) was 361 minutes, with a total sleep time (TST) of 247 minutes.   The patient's sleep latency was 5 minutes.  REM latency was 140 minutes.  The sleep efficiency was 68.4 %.     SLEEP ARCHITECTURE: WASO (Wake after sleep onset) was 23 minutes.  There were 41.5 minutes in Stage N1, 117.5 minutes Stage N2, 60.5 minutes Stage N3 and 27.5 minutes in Stage REM.  The percentage of Stage N1 was 16.8%, Stage N2 was 47.6%, Stage N3 was 24.5% and Stage R (REM sleep) was 11.1%.     RESPIRATORY ANALYSIS:  There were a total of 318 respiratory events:  99 obstructive apneas, 0 central apneas and 0 mixed apneas with a total of 99 apneas and an apnea index (AI) of 24. /hour. There were 219 hypopneas with a hypopnea index of 53.2 /hour. The patient also had 0 respiratory event related arousals (RERAs).     The total APNEA/HYPOPNEA INDEX (AHI) was 77.2 /hour and the total RESPIRATORY DISTURBANCE INDEX was 0. 77.2 /hour.  27 events occurred in REM sleep and 422 events in NREM. The REM AHI  was 58.9 /hour, versus a non-REM AHI of 79.5. The patient spent 247 minutes of total sleep time in the supine position and 0 minutes in non-supine.. The supine AHI was 77.2 versus a non-supine AHI of 0.0.  OXYGEN SATURATION & C02:  The Wake baseline 02 saturation was 96%, with the lowest being 62%. Time spent below 89% saturation equaled 153 minutes.   PERIODIC LIMB MOVEMENTS:  The patient had a total of 0 Periodic Limb Movements.  The Periodic Limb Movement (PLM) index was 0 and the PLM Arousal  index was 0/hour. The arousals were noted as: 27 were spontaneous, 0 were associated with PLMs, 116 were associated with respiratory events  Audio and video analysis did not show any abnormal or unusual movements, behaviors, phonations or vocalizations.   Snoring was noted. EKG was in keeping with normal sinus rhythm (NSR).  IMPRESSION:  1. Severe Obstructive Sleep Apnea (OSA) at AHI of 77.2/h. NREM AHI was higher than REM AHI. 2.  Hypoxemia for 153 minutes   RECOMMENDATIONS:  1. Advise urgent ,full-night, attended, CPAP titration study to optimize therapy. Please consider oxygen in the process as needed.      I certify that I have reviewed the entire raw data recording prior to the issuance of this report in accordance with the Standards of Accreditation of the American Academy of Sleep Medicine (AASM)      Larey Seat, MD Diplomat, American Board of Psychiatry and Neurology  Diplomat, American Board of Sleep Medicine Market researcher, [place] [facility] [employer]

## 2019-05-07 NOTE — Telephone Encounter (Signed)

## 2019-05-07 NOTE — Addendum Note (Signed)
Addended by: Larey Seat on: 05/07/2019 09:02 AM   Modules accepted: Orders

## 2019-05-07 NOTE — Telephone Encounter (Signed)
-----   Message from Larey Seat, MD sent at 05/07/2019  9:02 AM EST ----- 1. Severe Obstructive Sleep Apnea (OSA) at AHI of 77.2/h. NREM  AHI was higher than REM AHI.  2. Hypoxemia for 153 minutes total time during this sleep study is severe.   RECOMMENDATIONS:   1. Advise urgent ,full-night, attended, CPAP titration study to  optimize therapy. Please consider oxygen in the process as  needed.  Patient is not a candidate for dental device or inspire procedure due to hypoxemia and severity of apnea

## 2019-05-26 ENCOUNTER — Other Ambulatory Visit (HOSPITAL_COMMUNITY)
Admission: RE | Admit: 2019-05-26 | Discharge: 2019-05-26 | Disposition: A | Discharge status: Home / Self Care | Payer: Medicare Other | Source: Ambulatory Visit | Attending: Neurology | Admitting: Neurology

## 2019-05-26 DIAGNOSIS — Z20828 Contact with and (suspected) exposure to other viral communicable diseases: Secondary | ICD-10-CM | POA: Insufficient documentation

## 2019-05-26 DIAGNOSIS — Z01812 Encounter for preprocedural laboratory examination: Secondary | ICD-10-CM | POA: Insufficient documentation

## 2019-05-27 LAB — NOVEL CORONAVIRUS, NAA (HOSP ORDER, SEND-OUT TO REF LAB; TAT 18-24 HRS): SARS-CoV-2, NAA: NOT DETECTED

## 2019-05-29 ENCOUNTER — Telehealth: Payer: Self-pay

## 2019-05-29 NOTE — Telephone Encounter (Signed)
Left message on cell and home phone numbers confirming cpap appointment Saturday 05-30-2019 and patients covid test was negative.

## 2019-05-30 ENCOUNTER — Ambulatory Visit (INDEPENDENT_AMBULATORY_CARE_PROVIDER_SITE_OTHER): Payer: Medicare Other | Admitting: Neurology

## 2019-05-30 ENCOUNTER — Other Ambulatory Visit: Payer: Self-pay

## 2019-05-30 DIAGNOSIS — G4734 Idiopathic sleep related nonobstructive alveolar hypoventilation: Secondary | ICD-10-CM | POA: Diagnosis not present

## 2019-05-30 DIAGNOSIS — I739 Peripheral vascular disease, unspecified: Secondary | ICD-10-CM

## 2019-05-30 DIAGNOSIS — G4719 Other hypersomnia: Secondary | ICD-10-CM

## 2019-05-30 DIAGNOSIS — I209 Angina pectoris, unspecified: Secondary | ICD-10-CM

## 2019-05-30 DIAGNOSIS — Z6837 Body mass index (BMI) 37.0-37.9, adult: Secondary | ICD-10-CM

## 2019-05-30 DIAGNOSIS — G4733 Obstructive sleep apnea (adult) (pediatric): Secondary | ICD-10-CM

## 2019-06-09 ENCOUNTER — Telehealth: Payer: Self-pay | Admitting: Neurology

## 2019-06-09 NOTE — Addendum Note (Signed)
Addended by: Larey Seat on: 06/09/2019 12:11 PM   Modules accepted: Orders

## 2019-06-09 NOTE — Telephone Encounter (Signed)
Called the pt with the results of sleep study.  Patient will return call.

## 2019-06-09 NOTE — Progress Notes (Signed)
1. Baseline result under dental device: Severe Obstructive Sleep  Apnea (OSA) persisted under use of a dental device and was  associated with prolonged, clinically relevant hypoxia in sleep.  Nadir was SpO2 67 %  2. Change to CPAP was necessary because of the AHI being  extremely high at 86/h and the dangerously low amount of oxygen  saturation.  3. The patient tolerated CPAP very well and reached an AHI of  0.0/h for 103 minutes of sleep under 15 cm water, using a large  FFM.    RECOMMENDATIONS: We will order an autotitration capable CPAP  device, large FFM and humidity settings will be optional. The  pressure settings are: 8 through 18 cm water, 1 cm EPR.

## 2019-06-09 NOTE — Telephone Encounter (Signed)
Pt returned call. I advised pt that Dr. Brett Fairy reviewed their sleep study results and found that pt has moderate to severe sleep apnea. Dr. Brett Fairy recommends that pt starts auto CPAP. I reviewed PAP compliance expectations with the pt. Pt is agreeable to starting a CPAP. I advised pt that an order will be sent to a DME, [facility], and [facility] will call the pt within about one week after they file with the pt's insurance. [facility] will show the pt how to use the machine, fit for masks, and troubleshoot the CPAP if needed. A follow up appt was made for insurance purposes with  on Feb 22,2021 at 8 am with Debbora Presto, NP. Pt verbalized understanding to arrive 15 minutes early and bring their CPAP. A letter with all of this information in it will be mailed to the pt as a reminder. I verified with the pt that the address we have on file is correct. Pt verbalized understanding of results. Pt had no questions at this time but was encouraged to call back if questions arise. I have sent the order to [facility] and have received confirmation that they have received the order.

## 2019-06-09 NOTE — Procedures (Signed)
PATIENT'S NAME:  Howard [name], Howard [name] DOB:      06-06-1956      MR#:    [id number]     DATE OF RECORDING: 05/30/2019 REFERRING M.D.:  Crist Infante, MD Study Performed:  Split-Night Titration Study HISTORY:   This 63 year -old Caucasian male patient was seen on 04/14/2019 upon referral from Dr. Joylene Draft for a sleep consultation. Chief concern according to patient: " I have a dental device for apnea but I am still very sleepy"    I have the pleasure of seeing Howard [name] today, a right-handed Caucasian male with a possible sleep disorder. He  has a past medical history of Anxiety, Borderline diabetes, Coronary artery disease, Disorder of tendon of right biceps, Family history of adverse reaction to anesthesia, GERD (gastroesophageal reflux disease), Gout, History of esophageal spasm (2008), History of non-melanoma ( basalioma)  skin cancer, History of nuclear stress test (02/17/2009), Hyperlipidemia, Hypertension, Hypogonadism male, Hypothyroidism, Nocturia, OA (osteoarthritis), OSA (obstructive sleep apnea), and Right rotator cuff tear.   The patient had the first sleep study in the year 2010 at [facility] (under Dr. Danton Sewer - resulting dx:  severe apnea) - and later a home study through his dentist, and a repeated diagnoses of OSA was made. He has been using a dental device since, but feels he is sleepier again.   Patient is a former smoker, shift Insurance underwriter (EMS), has gout and arthritis pain, neck and toe, shoulder rotator cuff injuries, knee replacement with pain   The patient endorsed the Epworth Sleepiness Scale at 17/24 points   The patient's weight 260 pounds with a height of 74 (inches), resulting in a BMI of 33.4 kg/m2. The patient's neck circumference measured 20 inches.  CURRENT MEDICATIONS: Zyloprim, Norvasc, Wellbutrin, Crestor, Cymbalta, Pepcid, Synthroid, Androgel, Diovan   PROCEDURE:  This is a multichannel digital polysomnogram utilizing the Somnostar 11.2 system.  Electrodes  and sensors were applied and monitored per AASM Specifications.   EEG, EOG, Chin and Limb EMG, were sampled at 200 Hz.  ECG, Snore and Nasal Pressure, Thermal Airflow, Respiratory Effort, CPAP Flow and Pressure, Oximetry was sampled at 50 Hz. Digital video and audio were recorded.      BASELINE STUDY WITHOUT CPAP RESULTS: The first 2.5 hours of recording were establishing a "baseline " while using the dental device.   Lights Out was at 21:29 and Lights On at 04:17.  Total recording time (TRT) was 152, with a total sleep time (TST) of 139.5 minutes.   The patient's sleep latency was 8.5 minutes.  REM latency was 122 minutes.  The sleep efficiency was 91.8 %.    SLEEP ARCHITECTURE: WASO (Wake after sleep onset) was 2 minutes, Stage N1 was 6.5 minutes, Stage N2 was 123 minutes, Stage N3 was 0 minutes and Stage R (REM sleep) was 10 minutes.  The percentages were Stage N1 4.7%, Stage N2 88.2%, Stage N3 0% and Stage R (REM sleep) 7.2%.     RESPIRATORY ANALYSIS:  There were a total of 198 respiratory events:  110 obstructive apneas, 0 central apneas and 0 mixed apneas with a total of 110 apneas and an apnea index (AI) of 47.3. There were 88 hypopneas with a hypopnea index of 37.8. The patient also had 1 respiratory event related arousal (RERAs).  Snoring was noted.     The total APNEA/HYPOPNEA INDEX (AHI) was 85.2 /hour and the total RESPIRATORY DISTURBANCE INDEX was 85.6 /hour.   12 events occurred in REM sleep and 174  events in NREM. The REM AHI was 72, /hour versus a non-REM AHI of 86.2 /hour. The patient spent 386.5 minutes sleep time in the supine position 0 minutes in non-supine.  The supine AHI was 85.1 /hour versus a non-supine AHI of 0.0 /hour.  OXYGEN SATURATION & C02:  The wake baseline 02 saturation was 92%, with the lowest being 67%. Time spent below 89% saturation equaled 98 minutes.  The arousals were noted as: 10 were spontaneous, 0 were associated with PLMs, 134 were associated with  respiratory events. The patient had a total of 0 Periodic Limb Movements.    Snoring was noted EKG was in keeping with normal sinus rhythm (NSR)   TITRATION STUDY WITH CPAP RESULTS: CPAP was initiated at 5 cmH20 with heated humidity per AASM split night standards and pressure was advanced to 15 cmH20 because of hypopneas, apneas and desaturations.  At a PAP pressure of 15 cmH20, there was a reduction of the AHI to 0.0 /hour. The nadir was 88%, the patient slept 103 minutes with a FFM, Simplus in large size.   Total recording time (TRT) was 256.5 minutes, with a total sleep time (TST) of 247 minutes. The patient's sleep latency was 19.5 minutes. REM latency was 16.5 minutes.  The sleep efficiency was 96.3 %.    SLEEP ARCHITECTURE: Wake after sleep was 3 minutes, Stage N1 13.5 minutes, Stage N2 120 minutes, Stage N3 6.5 minutes and Stage R (REM sleep) 107 minutes. The percentages were: Stage N1 5.5%, Stage N2 48.6%, Stage N3 2.6% and Stage R (REM sleep) 43.3%. The sleep architecture was notable for prolonged REM rebound. RESPIRATORY ANALYSIS:  There were a total of 82 respiratory events: 46 obstructive apneas, 4 central apneas and 0 mixed apneas with a total of 50 apneas and an apnea index (AI) of 12.1. There were 32 hypopneas with a hypopnea index of 7.8 /hour. The patient also had 0 respiratory event related arousals (RERAs).     The total APNEA/HYPOPNEA INDEX  (AHI) was 19.9 /hour and the total RESPIRATORY DISTURBANCE INDEX was 19.9 /hour.  21 events occurred in REM sleep and 61 events in NREM. The REM AHI was 11.8 /hour versus a non-REM AHI of 26.1 /hour. REM sleep was achieved on a pressure of 10 cm/h2o (AHI was 38.2). The patient spent 100% of total sleep time in the supine position. The supine AHI was 19.9 /hour, versus a non-supine AHI of 0.0/hour.  OXYGEN SATURATION & C02:  The wake baseline 02 saturation was 94%, with the lowest being 65%. Time spent below 89% saturation equaled now only 38  minutes.  The arousals were noted as: 28 were spontaneous, none were associated with PLMs, and 57 were associated with respiratory events. The patient had a total of 0 Periodic Limb Movements.    POLYSOMNOGRAPHY IMPRESSION :   1. Baseline result under dental device: Severe Obstructive Sleep Apnea (OSA) persisted under use of a dental device and was associated with prolonged, clinically relevant hypoxia in sleep. Nadir was SpO2 67 % 2. Change to CPAP was necessary because of the AHI being extremely high at 86/h and the dangerously low amount of oxygen saturation.  3. The patient tolerated CPAP very well and reached an AHI of 0.0/h for 103 minutes of sleep under 15 cm water, using a large FFM.   RECOMMENDATIONS: We will order an autotitration capable CPAP device, large FFM and humidity settings will be optional. The pressure settings are: 8 through 18 cm water, 1 cm EPR.  A follow up appointment will be scheduled in the Sleep Clinic at [facility].      I certify that I have reviewed the entire raw data recording prior to the issuance of this report in accordance with the Standards of Accreditation of the American Academy of Sleep Medicine (AASM)      Larey Seat, M.D.   06-09-2019 Diplomat, American Board of Psychiatry and Neurology  Diplomat, [place] of Sleep Medicine Medical Director, [place] [facility]

## 2019-08-10 ENCOUNTER — Encounter: Payer: Self-pay | Admitting: Family Medicine

## 2019-08-10 ENCOUNTER — Other Ambulatory Visit: Payer: Self-pay

## 2019-08-10 ENCOUNTER — Ambulatory Visit (INDEPENDENT_AMBULATORY_CARE_PROVIDER_SITE_OTHER): Payer: Medicare Other | Admitting: Family Medicine

## 2019-08-10 VITALS — BP 138/80 | HR 67 | Temp 96.6°F | Ht 74.0 in | Wt 268.4 lb

## 2019-08-10 DIAGNOSIS — Z9989 Dependence on other enabling machines and devices: Secondary | ICD-10-CM

## 2019-08-10 DIAGNOSIS — G4733 Obstructive sleep apnea (adult) (pediatric): Secondary | ICD-10-CM

## 2019-08-10 NOTE — Progress Notes (Signed)
PATIENT: Howard [name] [name] DOB: 08/25/1955  REASON FOR VISIT: follow up HISTORY FROM: patient  Chief Complaint  Patient presents with  . Follow-up    Initial cpap f/u. Alone. Rm 9. Patient mentioned that he has been having issues with the seal on his mask.     HISTORY OF PRESENT ILLNESS: Today 08/10/19 Howard [name] [name] is a 64 y.o. male here today for follow up for OSA on CPAP. Sleep study in 04/2019 revealed severe OSA with AHI of 77.2 and hypoxemia for 153 minutes. O2 nadir of 62%. Autopap ordered with large FFM and optional humidity with pressure settings of 8-18cmH20.  He has noted significant improvements in sleep quality and improvement in fatigue since starting CPAP therapy.  Compliance report dated 07/11/2019 through 08/09/2019 reveals that he used CPAP 29 of the last 30 days for compliance of 97%.  He used CPAP greater than 4 hours 29 of the last 30 days.  Average usage was 6 hours and 58 minutes.  Residual AHI was 4.7 on 8 to 18 cm of water and an EPR of 1.  There was a very high leak noted in the 95th percentile of 60.4 L/min.  He states that he has had a lot of difficulty with the air leaking around his mask.  He notes that he wakes in the morning with the bottom of the mask in his mouth.  HISTORY: (copied from Howard [name] [name] note on 04/14/2019)  Howard [name] [name] a 64 y.o. year old Caucasian male patientseenon 04/14/2019 upon referral from Howard [name] [name] for a sleep consultation. Chiefconcernaccording to patient : " I have a dental device but I am very sleepy"   I have the pleasure of seeing Howard [name] [name] today,a right-handed Caucasian male with a possible sleep disorder. He  has a past medical history of Anxiety, Borderline diabetes, Coronary artery disease, Disorder of tendon of right biceps, Family history of adverse reaction to anesthesia, GERD (gastroesophageal reflux disease), Gout, History of esophageal spasm (2008), History of non-melanoma ( basalioma)   skin cancer, History of nuclear stress test (02/17/2009), Hyperlipidemia, Hypertension, Hypogonadism male, Hypothyroidism, Nocturia, OA (osteoarthritis), OSA (obstructive sleep apnea), and Right rotator cuff tear.  The patient had the first sleep study in the year 2010 at [facility] - severe apnea) - and later a home study through his dentist,  with a result of an AHI ( Apnea Hypopnea index)  Indicating OSA. He has been using a dental device since , but feels he is sleepier again.  Sleeprelevant medical history: former smoker, shift worker, gout and arthritis pain, neck and toe, shoulder rotator cuff injuries, knee replacement with pain.  Nocturia twice-  No Tonsillectomy, cervical spine trauma, deviated septum repair.Familymedical /sleep history:no other family member on CPAP with OSA, father was a snorer.   Social history:Howard [name] [name] worked for [employer], Patient is retired from EMS/ stroke  and lives in a household with 2 persons. Family status is married with 2 adult sons, 65 and 41, and grandchildren. Both sons out of state.  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 Tobacco use: former , 25 years. ETOH use: socially, 3/ week  Caffeine intake in form of Coffee( 3 cups  ) Soda( some ) Tea ( at outside dinner) or energy drinks. Regular exercise in form of walking/ in PT for the shoulder .    Sleep habits are as follows:The patient's dinner time is between 6-  6.30 PM. He watches Tv and often is asleep in the den in a recliner by 9.30/ The patient goes to bed at 11 PM and continues to sleep for several  hours, wakes for bathroom breaks, the first time at 2-3 AM and again 4-5 AM. .   The preferred sleep position is left side ( caveat is shoulder pain) , with the support of 1 pillow. Dreams are reportedly frequent , but not acting out. He has called out in his sleep- rarely. 7 AM is the usual rise time.  The patient  wakes up spontaneously.  Gets 8 hours of sleep- but reports not feeling refreshed or restored in AM, with symptoms such as dry mouth, no morning headaches, and residual fatigue.  Naps are taken after lunch  infrequently, lasting from 30 to 45 minutes and are more refreshing than nocturnal sleep.    REVIEW OF SYSTEMS: Out of a complete 14 system review of symptoms, the patient complains only of the following symptoms, none and all other reviewed systems are negative.  Epworth Sleepiness Scale: 9 Fatigue severity scale: 43  ALLERGIES: Allergies  Allergen Reactions  . Sulfa Antibiotics Rash    HOME MEDICATIONS: Outpatient Medications Prior to Visit  Medication Sig Dispense Refill  . allopurinol (ZYLOPRIM) 300 MG tablet Take 300 mg by mouth every morning.     Marland Kitchen amLODipine (NORVASC) 10 MG tablet Take 10 mg by mouth daily.    Marland Kitchen aspirin EC 81 MG tablet Take 81 mg by mouth daily.    Marland Kitchen buPROPion (WELLBUTRIN XL) 150 MG 24 hr tablet bupropion HCl XL 150 mg 24 hr tablet, extended release    . celecoxib (CELEBREX) 200 MG capsule Take 200 mg by mouth daily.    . CRESTOR 20 MG tablet Take 10 mg by mouth daily.   0  . DULoxetine (CYMBALTA) 60 MG capsule Take 60 mg by mouth every morning.    . famotidine (PEPCID) 20 MG tablet Take 20 mg by mouth daily.     Marland Kitchen levothyroxine (SYNTHROID, LEVOTHROID) 175 MCG tablet Take 175 mcg by mouth every morning.     . Multiple Vitamin (MULTIVITAMIN) tablet Take 1 tablet by mouth daily.    . Omega-3 Fatty Acids (FISH OIL) 1000 MG CAPS Take by mouth.    . testosterone (ANDROGEL) 50 MG/5GM (1%) GEL Place 5 g onto the skin 2 (two) times daily.    . valsartan-hydrochlorothiazide (DIOVAN-HCT) 320-25 MG per tablet Take 1 tablet by mouth every morning.     Marland Kitchen amLODipine (NORVASC) 5 MG tablet Take 5 mg by mouth daily.     No facility-administered medications prior to visit.    PAST MEDICAL HISTORY: Past Medical History:  Diagnosis Date  . Anxiety   . Borderline  diabetes   . Coronary artery disease    cardiologist-  Howard Marlou Porch--- coronary CT 09-11-2017 showed non-obstructive mild diffuse cad RCA and ostial LAD and moderate midLAD,    . Disorder of tendon of right biceps    dislocation  . Family history of adverse reaction to anesthesia    FATHER REACTED TO LIDOCAINE   . GERD (gastroesophageal reflux disease)   . Gout    per pt stable  . History of esophageal spasm 2008  . History of nonmelanoma skin cancer   . History of nuclear stress test 02/17/2009   normal , no ishemia w/ ef 60%  . Hyperlipidemia   . Hypertension   . Hypogonadism male    followed by pcp  .  Hypothyroidism    followed by pcp  . Nocturia   . OA (osteoarthritis)   . OSA (obstructive sleep apnea)    per pt has not used for severeal yrs, currently using mouth appliance  . Right rotator cuff tear     PAST SURGICAL HISTORY: Past Surgical History:  Procedure Laterality Date  . ANAL FISSURE REPAIR  yrs ago  . CARPAL TUNNEL RELEASE  2012 /2013   bil  . CATARACT EXTRACTION W/ INTRAOCULAR LENS  IMPLANT, BILATERAL  june 9 & 16-- 2020  . INJECTION KNEE Left 12/29/2018   Procedure: LEFT KNEE ASPIRATION AND INJECTION;  Surgeon: Tania Ade, MD;  Location: [facility];  Service: Orthopedics;  Laterality: Left;  . KNEE ARTHROSCOPY  12/12/2011   Procedure: ARTHROSCOPY KNEE;  Surgeon: Gearlean Alf, MD;  Location: [facility];  Service: Orthopedics;  Laterality: Left;  medial menisectomy and chondroplasty  . KNEE ARTHROSCOPY Left 10/29/2012   Procedure: LEFT ARTHROSCOPY KNEE WITH CHONDROPLASTY AND DEBRIDMENT OF MENISCUS;  Surgeon: Gearlean Alf, MD;  Location: [facility];  Service: Orthopedics;  Laterality: Left;  . KNEE SURGERY Bilateral right 1973 /  left 1985 /   right 1993  . PLANTAR FASCIA SURGERY Left 08-02-2008    Howard Beola Cord  @[facility]   . SHOULDER ARTHROSCOPY WITH ROTATOR CUFF REPAIR Right 12/29/2018   Procedure: SHOULDER ARTHROSCOPY WITH ROTATOR CUFF REPAIR and   SUBACROMIAL DECOMPRESSION AND BICEP TENOTOMY;  Surgeon: Tania Ade, MD;  Location: [facility];  Service: Orthopedics;  Laterality: Right;  . SHOULDER ARTHROSCOPY WITH ROTATOR CUFF REPAIR AND SUBACROMIAL DECOMPRESSION Right 04-09-2009   Howard supple  @[facility]    w/ labral debridement and dcr  . TOTAL KNEE ARTHROPLASTY Right 07/05/2014   Procedure: RIGHT TOTAL KNEE ARTHROPLASTY;  Surgeon: Gearlean Alf, MD;  Location: [facility];  Service: Orthopedics;  Laterality: Right;    FAMILY HISTORY: Family History  Problem Relation Age of Onset  . Heart disease Father   . Hypertension Father     SOCIAL HISTORY: Social History   Socioeconomic History  . Marital status: Married    Spouse name: Not on file  . Number of children: Not on file  . Years of education: Not on file  . Highest education level: Not on file  Occupational History  . Not on file  Tobacco Use  . Smoking status: Former Smoker    Years: 30.00    Types: Cigarettes    Quit date: 12/05/2000    Years since quitting: 18.6  . Smokeless tobacco: Never Used  Substance and Sexual Activity  . Alcohol use: Yes    Comment: occassional  . Drug use: Never  . Sexual activity: Not on file  Other Topics Concern  . Not on file  Social History Narrative  . Not on file   Social Determinants of Health   Financial Resource Strain:   . Difficulty of Paying Living Expenses: Not on file  Food Insecurity:   . Worried About Charity fundraiser in the Last Year: Not on file  . Ran Out of Food in the Last Year: Not on file  Transportation Needs:   . Lack of Transportation (Medical): Not on file  . Lack of Transportation (Non-Medical): Not on file  Physical Activity:   . Days of Exercise per Week: Not on file  . Minutes of Exercise per Session: Not on file  Stress:   . Feeling of Stress : Not on file  Social Connections:   .  Frequency of Communication with Friends and Family: Not on file  . Frequency of Social Gatherings  with Friends and Family: Not on file  . Attends Religious Services: Not on file  . Active Member of Clubs or Organizations: Not on file  . Attends Archivist Meetings: Not on file  . Marital Status: Not on file  Intimate Partner Violence:   . Fear of Current or Ex-Partner: Not on file  . Emotionally Abused: Not on file  . Physically Abused: Not on file  . Sexually Abused: Not on file      PHYSICAL EXAM  Vitals:   08/10/19 0748  BP: 138/80  Pulse: 67  Temp: (!) 96.6 F (35.9 C)  TempSrc: Oral  Weight: 268 lb 6.4 oz (121.7 kg)  Height: 6\' 2"  (1.88 m)   Body mass index is 34.46 kg/m.  Generalized: Well developed, in no acute distress  Cardiology: normal rate and rhythm, no murmur noted Respiratory: Clear to auscultation bilaterally Neurological examination  Mentation: Alert oriented to time, place, history taking. Follows all commands speech and language fluent Cranial nerve II-XII: Pupils were equal round reactive to light. Extraocular movements were full, visual field were full  Motor: The motor testing reveals 5 over 5 strength of all 4 extremities. Good symmetric motor tone is noted throughout.  Gait and station: Gait is normal.   DIAGNOSTIC DATA (LABS, IMAGING, TESTING) - I reviewed patient records, labs, notes, testing and imaging myself where available.  No flowsheet data found.   Lab Results  Component Value Date   WBC 12.7 (H) 07/07/2014   HGB 13.3 12/29/2018   HCT 39.0 12/29/2018   MCV 93.0 07/07/2014   PLT 215 07/07/2014      Component Value Date/Time   NA 137 12/29/2018 1103   NA 138 08/13/2017 1135   K 3.8 12/29/2018 1103   CL 101 12/29/2018 1103   CO2 24 08/13/2017 1135   GLUCOSE 111 (H) 12/29/2018 1103   BUN 23 12/29/2018 1103   BUN 12 08/13/2017 1135   CREATININE 0.80 12/29/2018 1103   CALCIUM 9.5 08/13/2017 1135   PROT 7.7 06/29/2014 1125   ALBUMIN 4.4 06/29/2014 1125   AST 42 (H) 06/29/2014 1125   ALT 33 06/29/2014 1125    ALKPHOS 66 06/29/2014 1125   BILITOT 0.7 06/29/2014 1125   GFRNONAA 91 08/13/2017 1135   GFRAA 106 08/13/2017 1135   Lab Results  Component Value Date   CHOL 122 02/07/2010   HDL 39.70 02/07/2010   LDLCALC 67 02/07/2010   LDLDIRECT 180.9 11/19/2006   TRIG 79.0 02/07/2010   CHOLHDL 3 02/07/2010   Lab Results  Component Value Date   HGBA1C 6.3 02/07/2010   No results found for: VITAMINB12 Lab Results  Component Value Date   TSH 3.82 02/07/2010     ASSESSMENT AND PLAN 64 y.o. year old male  has a past medical history of Anxiety, Borderline diabetes, Coronary artery disease, Disorder of tendon of right biceps, Family history of adverse reaction to anesthesia, GERD (gastroesophageal reflux disease), Gout, History of esophageal spasm (2008), History of nonmelanoma skin cancer, History of nuclear stress test (02/17/2009), Hyperlipidemia, Hypertension, Hypogonadism male, Hypothyroidism, Nocturia, OA (osteoarthritis), OSA (obstructive sleep apnea), and Right rotator cuff tear. here with     ICD-10-CM   1. OSA on CPAP  G47.33 For home use only DME continuous positive airway pressure (CPAP)   Z99.89     Donn is doing very well with CPAP therapy at home.  He  definitely notes improvement in sleep quality and less fatigue throughout the day.  Compliance report reveals excellent compliance.  There are concerns of a leak with his mask.  We have discussed several ways to adjust this at home.  I will also send orders to the DME company for a mask refitting if needed.  He was encouraged to continue using CPAP nightly and for greater than 4 hours each night.  He will follow-up with me in 3 months, sooner if needed.  He verbalizes understanding and agreement with this plan.   Orders Placed This Encounter  Procedures  . For home use only DME continuous positive airway pressure (CPAP)    Mask refitting please    Order Specific Question:   Length of Need    Answer:   Lifetime    Order Specific  Question:   Patient has OSA or probable OSA    Answer:   Yes    Order Specific Question:   Is the patient currently using CPAP in the home    Answer:   Yes    Order Specific Question:   Settings    Answer:   Other see comments    Order Specific Question:   CPAP supplies needed    Answer:   Mask, headgear, cushions, filters, heated tubing and water chamber     No orders of the defined types were placed in this encounter.     I spent 15 minutes with the patient. 50% of this time was spent counseling and educating patient on plan of care and medications.    Debbora Presto, FNP-C 08/10/2019, 8:25 AM [facility] [address], [place], [place] [address] [phone]

## 2019-08-10 NOTE — Patient Instructions (Signed)
Please continue using your CPAP regularly. While your insurance requires that you use CPAP at least 4 hours each night on 70% of the nights, I recommend, that you not skip any nights and use it throughout the night if you can. Getting used to CPAP and staying with the treatment long term does take time and patience and discipline. Untreated obstructive sleep apnea when it is moderate to severe can have an adverse impact on cardiovascular health and raise her risk for heart disease, arrhythmias, hypertension, congestive heart failure, stroke and diabetes. Untreated obstructive sleep apnea causes sleep disruption, nonrestorative sleep, and sleep deprivation. This can have an impact on your day to day functioning and cause daytime sleepiness and impairment of cognitive function, memory loss, mood disturbance, and problems focussing. Using CPAP regularly can improve these symptoms.   Follow up in 3 months   Sleep Apnea Sleep apnea affects breathing during sleep. It causes breathing to stop for a short time or to become shallow. It can also increase the risk of:  Heart attack.  Stroke.  Being very overweight (obese).  Diabetes.  Heart failure.  Irregular heartbeat. The goal of treatment is to help you breathe normally again. What are the causes? There are three kinds of sleep apnea:  Obstructive sleep apnea. This is caused by a blocked or collapsed airway.  Central sleep apnea. This happens when the brain does not send the right signals to the muscles that control breathing.  Mixed sleep apnea. This is a combination of obstructive and central sleep apnea. The most common cause of this condition is a collapsed or blocked airway. This can happen if:  Your throat muscles are too relaxed.  Your tongue and tonsils are too large.  You are overweight.  Your airway is too small. What increases the risk?  Being overweight.  Smoking.  Having a small airway.  Being older.  Being  male.  Drinking alcohol.  Taking medicines to calm yourself (sedatives or tranquilizers).  Having family members with the condition. What are the signs or symptoms?  Trouble staying asleep.  Being sleepy or tired during the day.  Getting angry a lot.  Loud snoring.  Headaches in the morning.  Not being able to focus your mind (concentrate).  Forgetting things.  Less interest in sex.  Mood swings.  Personality changes.  Feelings of sadness (depression).  Waking up a lot during the night to pee (urinate).  Dry mouth.  Sore throat. How is this diagnosed?  Your medical history.  A physical exam.  A test that is done when you are sleeping (sleep study). The test is most often done in a sleep lab but may also be done at home. How is this treated?   Sleeping on your side.  Using a medicine to get rid of mucus in your nose (decongestant).  Avoiding the use of alcohol, medicines to help you relax, or certain pain medicines (narcotics).  Losing weight, if needed.  Changing your diet.  Not smoking.  Using a machine to open your airway while you sleep, such as: ? An oral appliance. This is a mouthpiece that shifts your lower jaw forward. ? A CPAP device. This device blows air through a mask when you breathe out (exhale). ? An EPAP device. This has valves that you put in each nostril. ? A BPAP device. This device blows air through a mask when you breathe in (inhale) and breathe out.  Having surgery if other treatments do not work. It is   important to get treatment for sleep apnea. Without treatment, it can lead to:  High blood pressure.  Coronary artery disease.  In men, not being able to have an erection (impotence).  Reduced thinking ability. Follow these instructions at home: Lifestyle  Make changes that your doctor recommends.  Eat a healthy diet.  Lose weight if needed.  Avoid alcohol, medicines to help you relax, and some pain  medicines.  Do not use any products that contain nicotine or tobacco, such as cigarettes, e-cigarettes, and chewing tobacco. If you need help quitting, ask your doctor. General instructions  Take over-the-counter and prescription medicines only as told by your doctor.  If you were given a machine to use while you sleep, use it only as told by your doctor.  If you are having surgery, make sure to tell your doctor you have sleep apnea. You may need to bring your device with you.  Keep all follow-up visits as told by your doctor. This is important. Contact a doctor if:  The machine that you were given to use during sleep bothers you or does not seem to be working.  You do not get better.  You get worse. Get help right away if:  Your chest hurts.  You have trouble breathing in enough air.  You have an uncomfortable feeling in your back, arms, or stomach.  You have trouble talking.  One side of your body feels weak.  A part of your face is hanging down. These symptoms may be an emergency. Do not wait to see if the symptoms will go away. Get medical help right away. Call your local emergency services (911 in the [place] Do not drive yourself to the hospital. Summary  This condition affects breathing during sleep.  The most common cause is a collapsed or blocked airway.  The goal of treatment is to help you breathe normally while you sleep. This information is not intended to replace advice given to you by your health care provider. Make sure you discuss any questions you have with your health care provider. Document Revised: 03/21/2018 Document Reviewed: 01/28/2018 Elsevier Patient Education  2020 Elsevier Inc.  

## 2019-11-09 ENCOUNTER — Encounter: Payer: Self-pay | Admitting: Family Medicine

## 2019-11-09 ENCOUNTER — Ambulatory Visit (INDEPENDENT_AMBULATORY_CARE_PROVIDER_SITE_OTHER): Payer: Medicare Other | Admitting: Family Medicine

## 2019-11-09 ENCOUNTER — Other Ambulatory Visit: Payer: Self-pay

## 2019-11-09 VITALS — BP 135/81 | HR 69 | Ht 74.0 in | Wt 291.0 lb

## 2019-11-09 DIAGNOSIS — Z9989 Dependence on other enabling machines and devices: Secondary | ICD-10-CM

## 2019-11-09 DIAGNOSIS — G4733 Obstructive sleep apnea (adult) (pediatric): Secondary | ICD-10-CM | POA: Diagnosis not present

## 2019-11-09 NOTE — Progress Notes (Signed)
PATIENT: Howard [name] DOB: 24-Apr-1956  REASON FOR VISIT: follow up HISTORY FROM: patient  Chief Complaint  Patient presents with  . Follow-up    cpap fu, rm 1, alone,pt states he is doing well on cpap     HISTORY OF PRESENT ILLNESS: Today 11/09/19 Howard [name] is a 64 y.o. male here today for follow up for OSA on CPAP. He was sent for mask refitting at last visit due to elevated leak.  He admits that although the DME company has reached out to him, he has not pursued a mask refitting.  He does continue to note a leak on his machine.  He has not used his machine to help identify correct mask seal.  He does continue to note improvement in sleep quality and daytime energy.  Compliance report dated 10/06/2019 through 11/04/2019 reveals that he used CPAP 29 of the past 30 days for compliance of 97%.  He used CPAP greater than 4 hours 25 of the past 30 days for compliance of 83%.  Average usage was 5 hours and 44 minutes.  Residual AHI was 2.8 on 8 to 18 cm of water and an EPR of 1.  There is a significant leak in the 95th percentile of 67.3 L/min.  HISTORY: (copied from my note on 08/10/2019)  Howard [name] is a 64 y.o. male here today for follow up for OSA on CPAP. Sleep study in 04/2019 revealed severe OSA with AHI of 77.2 and hypoxemia for 153 minutes. O2 nadir of 62%. Autopap ordered with large FFM and optional humidity with pressure settings of 8-18cmH20.  He has noted significant improvements in sleep quality and improvement in fatigue since starting CPAP therapy.  Compliance report dated 07/11/2019 through 08/09/2019 reveals that he used CPAP 29 of the last 30 days for compliance of 97%.  He used CPAP greater than 4 hours 29 of the last 30 days.  Average usage was 6 hours and 58 minutes.  Residual AHI was 4.7 on 8 to 18 cm of water and an EPR of 1.  There was a very high leak noted in the 95th percentile of 60.4 L/min.  He states that he has had a lot of difficulty with  the air leaking around his mask.  He notes that he wakes in the morning with the bottom of the mask in his mouth.  HISTORY: (copied from Dr Edwena Felty note on 04/14/2019)  Howard [name] a63 y.o.year old Caucasianmalepatientseenon10/27/2020upon referralfromDr. Perinifor asleep consultation. Chiefconcernaccording to patient :" I have a dental devicebut I am very sleepy"  I have the pleasure of seeingWilliam R McCormicktoday,aright-handedCaucasian malewith a possible sleep disorder. Hehas a past medical history of Anxiety, Borderline diabetes, Coronary artery disease, Disorder of tendon of right biceps, Family history of adverse reaction to anesthesia, GERD (gastroesophageal reflux disease), Gout, History of esophageal spasm (2008), History of non-melanoma( basalioma)skin cancer, History of nuclear stress test (02/17/2009), Hyperlipidemia, Hypertension, Hypogonadism male, Hypothyroidism, Nocturia, OA (osteoarthritis), OSA (obstructive sleep apnea), and Right rotator cuff tear.  The patient had the first sleep study in the year 2010 at [facility] - severe apnea) - and later a home study through his dentist,with a result of an AHI ( Apnea Hypopnea index) Indicating OSA. He has been using a dental device since , but feels he is sleepier again. Sleeprelevant medical history:former smoker, shift worker, gout and arthritis pain, neck and toe, shoulder rotator cuff injuries, knee replacement with pain.Nocturia twice- No Tonsillectomy, cervical  spinetrauma,deviated septum repair.Familymedical /sleep history:noother family member on CPAP with OSA, father was a snorer.  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 Family status is married with 2 adult sons, 69 and 47, andgrandchildren.Both sons out of state. 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 Tobacco use: former , 25 years. ETOH use: socially, 3/ week Caffeine intake in form of Coffee(3 cups) Soda( some) Tea (at outside dinner) or energy drinks. Regular exercise in form ofwalking/ in PT for the shoulder.   Sleep habits are as follows:The patient's dinner time is between6- 6.30PM. He watches Tv and often is asleep in the den in a recliner by 9.30/ The patient goes to bed at11PM and continues to sleep for severalhours, wakes for bathroom breaks, the first time at 2-3AM and again 4-5 AM..  The preferred sleep position isleft side ( caveat is shoulder pain), with the support of1pillow. Dreams are reportedly frequent , but not acting out. He has called out in his sleep- rarely. 7AM is the usual rise time.  The patient wakes up spontaneously.Gets 8 hours of sleep- butreports not feeling refreshed or restored in AM, with symptoms such as dry mouth, nomorning headaches,and residual fatigue.  Naps are takenafter lunchinfrequently, lasting from 30to 62minutes and are more refreshing than nocturnal sleep.   REVIEW OF SYSTEMS: Out of a complete 14 system review of symptoms, the patient complains only of the following symptoms, none and all other reviewed systems are negative.  ESS: 10 FSS: 45  ALLERGIES: Allergies  Allergen Reactions  . Sulfa Antibiotics Rash    HOME MEDICATIONS: Outpatient Medications Prior to Visit  Medication Sig Dispense Refill  . allopurinol (ZYLOPRIM) 300 MG tablet Take 300 mg by mouth every morning.     Marland Kitchen amLODipine (NORVASC) 10 MG tablet Take 10 mg by mouth daily.    Marland Kitchen aspirin EC 81 MG tablet Take 81 mg by mouth daily.    Marland Kitchen buPROPion (WELLBUTRIN XL) 150 MG 24 hr tablet bupropion HCl XL 150 mg 24 hr tablet, extended release    . celecoxib (CELEBREX) 200 MG capsule Take 200 mg by mouth daily.    . CRESTOR 20 MG tablet Take 10 mg by mouth daily.   0  . doxazosin (CARDURA) 2  MG tablet Take 2 mg by mouth daily.    . DULoxetine (CYMBALTA) 60 MG capsule Take 60 mg by mouth every morning.    . famotidine (PEPCID) 20 MG tablet Take 20 mg by mouth daily.     Marland Kitchen levothyroxine (SYNTHROID, LEVOTHROID) 175 MCG tablet Take 175 mcg by mouth every morning.     . Multiple Vitamin (MULTIVITAMIN) tablet Take 1 tablet by mouth daily.    . Omega-3 Fatty Acids (FISH OIL) 1000 MG CAPS Take by mouth.    . testosterone (ANDROGEL) 50 MG/5GM (1%) GEL Place 5 g onto the skin 2 (two) times daily.    . valsartan-hydrochlorothiazide (DIOVAN-HCT) 320-25 MG per tablet Take 1 tablet by mouth every morning.      No facility-administered medications prior to visit.    PAST MEDICAL HISTORY: Past Medical History:  Diagnosis Date  . Anxiety   . Borderline diabetes   . Coronary artery disease    cardiologist-  dr Marlou Porch--- coronary CT 09-11-2017 showed non-obstructive mild diffuse cad RCA and ostial LAD and moderate midLAD,    . Disorder of tendon of right biceps  dislocation  . Family history of adverse reaction to anesthesia    FATHER REACTED TO LIDOCAINE   . GERD (gastroesophageal reflux disease)   . Gout    per pt stable  . History of esophageal spasm 2008  . History of nonmelanoma skin cancer   . History of nuclear stress test 02/17/2009   normal , no ishemia w/ ef 60%  . Hyperlipidemia   . Hypertension   . Hypogonadism male    followed by pcp  . Hypothyroidism    followed by pcp  . Nocturia   . OA (osteoarthritis)   . OSA (obstructive sleep apnea)    per pt has not used for severeal yrs, currently using mouth appliance  . Right rotator cuff tear     PAST SURGICAL HISTORY: Past Surgical History:  Procedure Laterality Date  . ANAL FISSURE REPAIR  yrs ago  . CARPAL TUNNEL RELEASE  2012 /2013   bil  . CATARACT EXTRACTION W/ INTRAOCULAR LENS  IMPLANT, BILATERAL  june 9 & 16-- 2020  . INJECTION KNEE Left 12/29/2018   Procedure: LEFT KNEE ASPIRATION AND INJECTION;  Surgeon:  Tania Ade, MD;  Location: [facility];  Service: Orthopedics;  Laterality: Left;  . KNEE ARTHROSCOPY  12/12/2011   Procedure: ARTHROSCOPY KNEE;  Surgeon: Gearlean Alf, MD;  Location: [facility];  Service: Orthopedics;  Laterality: Left;  medial menisectomy and chondroplasty  . KNEE ARTHROSCOPY Left 10/29/2012   Procedure: LEFT ARTHROSCOPY KNEE WITH CHONDROPLASTY AND DEBRIDMENT OF MENISCUS;  Surgeon: Gearlean Alf, MD;  Location: [facility];  Service: Orthopedics;  Laterality: Left;  . KNEE SURGERY Bilateral right 1973 /  left 1985 /   right 1993  . PLANTAR FASCIA SURGERY Left 08-02-2008    dr Beola Cord  @[facility]   . SHOULDER ARTHROSCOPY WITH ROTATOR CUFF REPAIR Right 12/29/2018   Procedure: SHOULDER ARTHROSCOPY WITH ROTATOR CUFF REPAIR and  SUBACROMIAL DECOMPRESSION AND BICEP TENOTOMY;  Surgeon: Tania Ade, MD;  Location: [facility];  Service: Orthopedics;  Laterality: Right;  . SHOULDER ARTHROSCOPY WITH ROTATOR CUFF REPAIR AND SUBACROMIAL DECOMPRESSION Right 04-09-2009   dr supple  @[facility]    w/ labral debridement and dcr  . TOTAL KNEE ARTHROPLASTY Right 07/05/2014   Procedure: RIGHT TOTAL KNEE ARTHROPLASTY;  Surgeon: Gearlean Alf, MD;  Location: [facility];  Service: Orthopedics;  Laterality: Right;    FAMILY HISTORY: Family History  Problem Relation Age of Onset  . Heart disease Father   . Hypertension Father     SOCIAL HISTORY: Social History   Socioeconomic History  . Marital status: Married    Spouse name: Not on file  . Number of children: Not on file  . Years of education: Not on file  . Highest education level: Not on file  Occupational History  . Not on file  Tobacco Use  . Smoking status: Former Smoker    Years: 30.00    Types: Cigarettes    Quit date: 12/05/2000    Years since quitting: 18.9  . Smokeless tobacco: Never Used  Substance and Sexual Activity  . Alcohol use: Yes    Comment: occassional  . Drug use: Never  . Sexual activity:  Not on file  Other Topics Concern  . Not on file  Social History Narrative  . Not on file   Social Determinants of Health   Financial Resource Strain:   . Difficulty of Paying Living Expenses:   Food Insecurity:   . Worried About [employer] of  Food in the Last Year:   . [place] in the Last Year:   Transportation Needs:   . Film/video editor (Medical):   Marland Kitchen Lack of Transportation (Non-Medical):   Physical Activity:   . Days of Exercise per Week:   . Minutes of Exercise per Session:   Stress:   . Feeling of Stress :   Social Connections:   . Frequency of Communication with Friends and Family:   . Frequency of Social Gatherings with Friends and Family:   . Attends Religious Services:   . Active Member of Clubs or Organizations:   . Attends Archivist Meetings:   Marland Kitchen Marital Status:   Intimate Partner Violence:   . Fear of Current or Ex-Partner:   . Emotionally Abused:   Marland Kitchen Physically Abused:   . Sexually Abused:       PHYSICAL EXAM  Vitals:   11/09/19 0817  BP: 135/81  Pulse: 69  Weight: 291 lb (132 kg)  Height: 6\' 2"  (1.88 m)   Body mass index is 37.36 kg/m.  Generalized: Well developed, in no acute distress  Cardiology: normal rate and rhythm, no murmur noted Respiratory: clear to auscultation bilaterally  Neurological examination  Mentation: Alert oriented to time, place, history taking. Follows all commands speech and language fluent Cranial nerve II-XII: Pupils were equal round reactive to light. Extraocular movements were full Motor: The motor testing reveals 5 over 5 strength of all 4 extremities. Good symmetric motor tone is noted throughout.  Gait and station: Gait is normal.   DIAGNOSTIC DATA (LABS, IMAGING, TESTING) - I reviewed patient records, labs, notes, testing and imaging myself where available.  No flowsheet data found.   Lab Results  Component Value Date   WBC 12.7 (H) 07/07/2014   HGB 13.3 12/29/2018   HCT 39.0  12/29/2018   MCV 93.0 07/07/2014   PLT 215 07/07/2014      Component Value Date/Time   NA 137 12/29/2018 1103   NA 138 08/13/2017 1135   K 3.8 12/29/2018 1103   CL 101 12/29/2018 1103   CO2 24 08/13/2017 1135   GLUCOSE 111 (H) 12/29/2018 1103   BUN 23 12/29/2018 1103   BUN 12 08/13/2017 1135   CREATININE 0.80 12/29/2018 1103   CALCIUM 9.5 08/13/2017 1135   PROT 7.7 06/29/2014 1125   ALBUMIN 4.4 06/29/2014 1125   AST 42 (H) 06/29/2014 1125   ALT 33 06/29/2014 1125   ALKPHOS 66 06/29/2014 1125   BILITOT 0.7 06/29/2014 1125   GFRNONAA 91 08/13/2017 1135   GFRAA 106 08/13/2017 1135   Lab Results  Component Value Date   CHOL 122 02/07/2010   HDL 39.70 02/07/2010   LDLCALC 67 02/07/2010   LDLDIRECT 180.9 11/19/2006   TRIG 79.0 02/07/2010   CHOLHDL 3 02/07/2010   Lab Results  Component Value Date   HGBA1C 6.3 02/07/2010   No results found for: VITAMINB12 Lab Results  Component Value Date   TSH 3.82 02/07/2010       ASSESSMENT AND PLAN 64 y.o. year old male  has a past medical history of Anxiety, Borderline diabetes, Coronary artery disease, Disorder of tendon of right biceps, Family history of adverse reaction to anesthesia, GERD (gastroesophageal reflux disease), Gout, History of esophageal spasm (2008), History of nonmelanoma skin cancer, History of nuclear stress test (02/17/2009), Hyperlipidemia, Hypertension, Hypogonadism male, Hypothyroidism, Nocturia, OA (osteoarthritis), OSA (obstructive sleep apnea), and Right rotator cuff tear. here with     ICD-10-CM  1. OSA on CPAP  G47.33 For home use only DME continuous positive airway pressure (CPAP)   Z99.89      Henderson continues to note benefit with CPAP therapy.  Compliance report reveals optimal compliance.  Unfortunately, concerns of a large leak continue.  We placed orders at his last visit for mask refitting but he has not pursued this option.  He is happy with his full facemask and does not wish to try anything  new.  He would like to continue working on tightening his headgear at home.  Fortunately, residual AHI is 2.8.  He was encouraged to continue using CPAP nightly and for greater than 4 hours each night.  He will follow up with me in 1 year, sooner if needed.  He verbalizes understanding and agreement with this plan.  Orders Placed This Encounter  Procedures  . For home use only DME continuous positive airway pressure (CPAP)    Order Specific Question:   Length of Need    Answer:   Lifetime    Order Specific Question:   Patient has OSA or probable OSA    Answer:   Yes    Order Specific Question:   Is the patient currently using CPAP in the home    Answer:   Yes    Order Specific Question:   Settings    Answer:   Other see comments    Order Specific Question:   CPAP supplies needed    Answer:   Mask, headgear, cushions, filters, heated tubing and water chamber     No orders of the defined types were placed in this encounter.     I spent 15 minutes with the patient. 50% of this time was spent counseling and educating patient on plan of care and medications.    Debbora Presto, FNP-C 11/09/2019, 9:01 AM [facility] [address], [place], [place] [address] [phone]

## 2019-11-09 NOTE — Patient Instructions (Addendum)
°Please continue using your CPAP regularly. While your insurance requires that you use CPAP at least 4 hours each night on 70% of the nights, I recommend, that you not skip any nights and use it throughout the night if you can. Getting used to CPAP and staying with the treatment long term does take time and patience and discipline. Untreated obstructive sleep apnea when it is moderate to severe can have an adverse impact on cardiovascular health and raise her risk for heart disease, arrhythmias, hypertension, congestive heart failure, stroke and diabetes. Untreated obstructive sleep apnea causes sleep disruption, nonrestorative sleep, and sleep deprivation. This can have an impact on your day to day functioning and cause daytime sleepiness and impairment of cognitive function, memory loss, mood disturbance, and problems focussing. Using CPAP regularly can improve these symptoms. ° ° °Follow up in 1 year  ° °CPAP and BPAP Information °CPAP and BPAP are methods of helping a person breathe with the use of air pressure. CPAP stands for "continuous positive airway pressure." BPAP stands for "bi-level positive airway pressure." In both methods, air is blown through your nose or mouth and into your air passages to help you breathe well. °CPAP and BPAP use different amounts of pressure to blow air. With CPAP, the amount of pressure stays the same while you breathe in and out. With BPAP, the amount of pressure is increased when you breathe in (inhale) so that you can take larger breaths. Your health care provider will recommend whether CPAP or BPAP would be more helpful for you. °Why are CPAP and BPAP treatments used? °CPAP or BPAP can be helpful if you have: °· Sleep apnea. °· Chronic obstructive pulmonary disease (COPD). °· Heart failure. °· Medical conditions that weaken the muscles of the chest including muscular dystrophy, or neurological diseases such as amyotrophic lateral sclerosis (ALS). °· Other problems that  cause breathing to be weak, abnormal, or difficult. °CPAP is most commonly used for obstructive sleep apnea (OSA) to keep the airways from collapsing when the muscles relax during sleep. °How is CPAP or BPAP administered? °Both CPAP and BPAP are provided by a small machine with a flexible plastic tube that attaches to a plastic mask. You wear the mask. Air is blown through the mask into your nose or mouth. The amount of pressure that is used to blow the air can be adjusted on the machine. Your health care provider will determine the pressure setting that should be used based on your individual needs. °When should CPAP or BPAP be used? °In most cases, the mask only needs to be worn during sleep. Generally, the mask needs to be worn throughout the night and during any daytime naps. People with certain medical conditions may also need to wear the mask at other times when they are awake. Follow instructions from your health care provider about when to use the machine. °What are some tips for using the mask? ° °· Because the mask needs to be snug, some people feel trapped or closed-in (claustrophobic) when first using the mask. If you feel this way, you may need to get used to the mask. One way to do this is by holding the mask loosely over your nose or mouth and then gradually applying the mask more snugly. You can also gradually increase the amount of time that you use the mask. °· Masks are available in various types and sizes. Some fit over your mouth and nose while others fit over just your nose. If your mask   does not fit well, talk with your health care provider about getting a different one. °· If you are using a mask that fits over your nose and you tend to breathe through your mouth, a chin strap may be applied to help keep your mouth closed. °· The CPAP and BPAP machines have alarms that may sound if the mask comes off or develops a leak. °· If you have trouble with the mask, it is very important that you talk  with your health care provider about finding a way to make the mask easier to tolerate. Do not stop using the mask. Stopping the use of the mask could have a negative impact on your health. °What are some tips for using the machine? °· Place your CPAP or BPAP machine on a secure table or stand near an electrical outlet. °· Know where the on/off switch is located on the machine. °· Follow instructions from your health care provider about how to set the pressure on your machine and when you should use it. °· Do not eat or drink while the CPAP or BPAP machine is on. Food or fluids could get pushed into your lungs by the pressure of the CPAP or BPAP. °· Do not smoke. Tobacco smoke residue can damage the machine. °· For home use, CPAP and BPAP machines can be rented or purchased through home health care companies. Many different brands of machines are available. Renting a machine before purchasing may help you find out which particular machine works well for you. °· Keep the CPAP or BPAP machine and attachments clean. Ask your health care provider for specific instructions. °Get help right away if: °· You have redness or open areas around your nose or mouth where the mask fits. °· You have trouble using the CPAP or BPAP machine. °· You cannot tolerate wearing the CPAP or BPAP mask. °· You have pain, discomfort, and bloating in your abdomen. °Summary °· CPAP and BPAP are methods of helping a person breathe with the use of air pressure. °· Both CPAP and BPAP are provided by a small machine with a flexible plastic tube that attaches to a plastic mask. °· If you have trouble with the mask, it is very important that you talk with your health care provider about finding a way to make the mask easier to tolerate. °This information is not intended to replace advice given to you by your health care provider. Make sure you discuss any questions you have with your health care provider. °Document Revised: 09/24/2018 Document  Reviewed: 04/23/2016 °Elsevier Patient Education © 2020 [employer] ° °

## 2019-11-10 NOTE — Progress Notes (Signed)
Order for cpap supplies sent to [facility] via community msg. Confirmation received that the order transmitted was successful.  

## 2020-04-18 ENCOUNTER — Ambulatory Visit
Admission: EM | Admit: 2020-04-18 | Discharge: 2020-04-18 | Disposition: A | Discharge status: Other Acute Inpatient Hospital | Payer: Medicare Other | Attending: Family Medicine | Admitting: Family Medicine

## 2020-04-18 ENCOUNTER — Other Ambulatory Visit: Payer: Self-pay

## 2020-04-18 ENCOUNTER — Ambulatory Visit (INDEPENDENT_AMBULATORY_CARE_PROVIDER_SITE_OTHER): Payer: Medicare Other

## 2020-04-18 ENCOUNTER — Encounter (HOSPITAL_COMMUNITY): Payer: Self-pay

## 2020-04-18 ENCOUNTER — Emergency Department (HOSPITAL_COMMUNITY)
Admission: EM | Admit: 2020-04-18 | Discharge: 2020-04-19 | Disposition: A | Discharge status: Home / Self Care | Payer: Medicare Other | Attending: Emergency Medicine | Admitting: Emergency Medicine

## 2020-04-18 DIAGNOSIS — Z20822 Contact with and (suspected) exposure to covid-19: Secondary | ICD-10-CM | POA: Diagnosis not present

## 2020-04-18 DIAGNOSIS — R7989 Other specified abnormal findings of blood chemistry: Secondary | ICD-10-CM

## 2020-04-18 DIAGNOSIS — Z96651 Presence of right artificial knee joint: Secondary | ICD-10-CM | POA: Insufficient documentation

## 2020-04-18 DIAGNOSIS — E039 Hypothyroidism, unspecified: Secondary | ICD-10-CM | POA: Diagnosis not present

## 2020-04-18 DIAGNOSIS — R0981 Nasal congestion: Secondary | ICD-10-CM | POA: Diagnosis not present

## 2020-04-18 DIAGNOSIS — Z79899 Other long term (current) drug therapy: Secondary | ICD-10-CM | POA: Insufficient documentation

## 2020-04-18 DIAGNOSIS — Z87891 Personal history of nicotine dependence: Secondary | ICD-10-CM | POA: Insufficient documentation

## 2020-04-18 DIAGNOSIS — I119 Hypertensive heart disease without heart failure: Secondary | ICD-10-CM | POA: Diagnosis not present

## 2020-04-18 DIAGNOSIS — R0602 Shortness of breath: Secondary | ICD-10-CM | POA: Diagnosis not present

## 2020-04-18 DIAGNOSIS — I2510 Atherosclerotic heart disease of native coronary artery without angina pectoris: Secondary | ICD-10-CM | POA: Insufficient documentation

## 2020-04-18 DIAGNOSIS — Z85828 Personal history of other malignant neoplasm of skin: Secondary | ICD-10-CM | POA: Diagnosis not present

## 2020-04-18 DIAGNOSIS — Z7982 Long term (current) use of aspirin: Secondary | ICD-10-CM | POA: Insufficient documentation

## 2020-04-18 DIAGNOSIS — R059 Cough, unspecified: Secondary | ICD-10-CM | POA: Diagnosis not present

## 2020-04-18 DIAGNOSIS — E119 Type 2 diabetes mellitus without complications: Secondary | ICD-10-CM | POA: Diagnosis not present

## 2020-04-18 LAB — CBC WITH DIFFERENTIAL/PLATELET
Abs Immature Granulocytes: 0.01 10*3/uL (ref 0.00–0.07)
Basophils Absolute: 0 10*3/uL (ref 0.0–0.1)
Basophils Relative: 0 %
Eosinophils Absolute: 0 10*3/uL (ref 0.0–0.5)
Eosinophils Relative: 0 %
HCT: 38.5 % — ABNORMAL LOW (ref 39.0–52.0)
Hemoglobin: 13.1 g/dL (ref 13.0–17.0)
Immature Granulocytes: 0 %
Lymphocytes Relative: 22 %
Lymphs Abs: 0.9 10*3/uL (ref 0.7–4.0)
MCH: 30.7 pg (ref 26.0–34.0)
MCHC: 34 g/dL (ref 30.0–36.0)
MCV: 90.2 fL (ref 80.0–100.0)
Monocytes Absolute: 0.6 10*3/uL (ref 0.1–1.0)
Monocytes Relative: 15 %
Neutro Abs: 2.6 10*3/uL (ref 1.7–7.7)
Neutrophils Relative %: 63 %
Platelets: 190 10*3/uL (ref 150–400)
RBC: 4.27 MIL/uL (ref 4.22–5.81)
RDW: 13.3 % (ref 11.5–15.5)
WBC: 4.1 10*3/uL (ref 4.0–10.5)
nRBC: 0 % (ref 0.0–0.2)

## 2020-04-18 LAB — FIBRIN DERIVATIVES D-DIMER (ARMC ONLY): Fibrin derivatives D-dimer (ARMC): 546.49 ng/mL (FEU) — ABNORMAL HIGH (ref 0.00–499.00)

## 2020-04-18 NOTE — ED Triage Notes (Signed)
Patient states that he has been shortness of breath x 3 days. States that he has recently been traveling to [place] and [place]. States that he is concerned for PE and PCP advised him to come here. Patient reports that he has nasal congestion and runny nose and ear pain. Reports that he flew back today and was on the airplane for 7 hours.

## 2020-04-18 NOTE — ED Triage Notes (Signed)
Patient arrived stating he has been short of breath and congested over the last three days. Reports recent travel. [facility] due to an elevated d-dimer to rule out PE.   X-ray and labs done at [facility].

## 2020-04-18 NOTE — ED Provider Notes (Signed)
[facility]    CSN: [id number] Arrival date & time: 04/18/20  1742   History   Chief Complaint Chief Complaint  Patient presents with  . Shortness of Breath   HPI  64 year old male presents with SOB.  Patient reports recent travel (flights to [place]). 3 day history of SOB. Seems to be worse with exertion. Also reports congestion, runny nose, fatigue. No fever. No relieving factors. Called PCP regarding symptoms and was recommended to come here. Concern for PE given travel (per patient). Patient appears to have had exertional SOB before and has seen Cardiology. No other complaints.  Past Medical History:  Diagnosis Date  . Anxiety   . Borderline diabetes   . Coronary artery disease    cardiologist-  dr Marlou Porch--- coronary CT 09-11-2017 showed non-obstructive mild diffuse cad RCA and ostial LAD and moderate midLAD,    . Disorder of tendon of right biceps    dislocation  . Family history of adverse reaction to anesthesia    FATHER REACTED TO LIDOCAINE   . GERD (gastroesophageal reflux disease)   . Gout    per pt stable  . History of esophageal spasm 2008  . History of nonmelanoma skin cancer   . History of nuclear stress test 02/17/2009   normal , no ishemia w/ ef 60%  . Hyperlipidemia   . Hypertension   . Hypogonadism male    followed by pcp  . Hypothyroidism    followed by pcp  . Nocturia   . OA (osteoarthritis)   . OSA (obstructive sleep apnea)    per pt has not used for severeal yrs, currently using mouth appliance  . Right rotator cuff tear     Patient Active Problem List   Diagnosis Date Noted  . Obesity (BMI 30.0-34.9) 05/07/2019  . Severe obstructive sleep apnea-hypopnea syndrome 05/07/2019  . Excessive daytime sleepiness 05/07/2019  . Claudication ([place]) 04/14/2019  . Class 2 severe obesity with serious comorbidity and body mass index (BMI) of 37.0 to 37.9 in adult [facility]) 04/14/2019  . Angina pectoris ([place]) 04/14/2019  . OA  (osteoarthritis) of knee 07/05/2014  . Acute medial meniscal tear 12/12/2011  . ACUTE SINUSITIS, UNSPECIFIED 11/18/2009  . DENTAL PAIN 11/18/2009  . HYPOGONADISM 02/15/2009  . PLANTAR FASCIITIS 06/17/2007  . OBSTRUCTIVE SLEEP APNEA 06/16/2007  . OTHER ACUTE SINUSITIS 05/09/2007  . HYPOTHYROIDISM 03/05/2007  . DIABETES MELLITUS, TYPE II 03/05/2007  . HYPERLIPIDEMIA 03/05/2007  . DEPRESSION 03/05/2007  . HYPERTENSION 03/05/2007  . OSTEOARTHRITIS 03/05/2007    Past Surgical History:  Procedure Laterality Date  . ANAL FISSURE REPAIR  yrs ago  . CARPAL TUNNEL RELEASE  2012 /2013   bil  . CATARACT EXTRACTION W/ INTRAOCULAR LENS  IMPLANT, BILATERAL  june 9 & 16-- 2020  . INJECTION KNEE Left 12/29/2018   Procedure: LEFT KNEE ASPIRATION AND INJECTION;  Surgeon: Tania Ade, MD;  Location: [place];  Service: Orthopedics;  Laterality: Left;  . KNEE ARTHROSCOPY  12/12/2011   Procedure: ARTHROSCOPY KNEE;  Surgeon: Gearlean Alf, MD;  Location: [facility];  Service: Orthopedics;  Laterality: Left;  medial menisectomy and chondroplasty  . KNEE ARTHROSCOPY Left 10/29/2012   Procedure: LEFT ARTHROSCOPY KNEE WITH CHONDROPLASTY AND DEBRIDMENT OF MENISCUS;  Surgeon: Gearlean Alf, MD;  Location: [facility];  Service: Orthopedics;  Laterality: Left;  . KNEE SURGERY Bilateral right 1973 /  left 1985 /   right 1993  . PLANTAR FASCIA SURGERY Left 08-02-2008  dr Beola Cord  @[facility]   . SHOULDER ARTHROSCOPY WITH ROTATOR CUFF REPAIR Right 12/29/2018   Procedure: SHOULDER ARTHROSCOPY WITH ROTATOR CUFF REPAIR and  SUBACROMIAL DECOMPRESSION AND BICEP TENOTOMY;  Surgeon: Tania Ade, MD;  Location: [place];  Service: Orthopedics;  Laterality: Right;  . SHOULDER ARTHROSCOPY WITH ROTATOR CUFF REPAIR AND SUBACROMIAL DECOMPRESSION Right 04-09-2009   dr supple  @[facility]    w/ labral debridement and dcr  . TOTAL KNEE ARTHROPLASTY Right 07/05/2014   Procedure: RIGHT TOTAL KNEE  ARTHROPLASTY;  Surgeon: Gearlean Alf, MD;  Location: [facility];  Service: Orthopedics;  Laterality: Right;       Home Medications    Prior to Admission medications   Medication Sig Start Date End Date Taking? Authorizing Provider  allopurinol (ZYLOPRIM) 300 MG tablet Take 300 mg by mouth every morning.    Yes [provider]  amLODipine (NORVASC) 10 MG tablet Take 10 mg by mouth daily. 02/19/19  Yes [provider]  aspirin EC 81 MG tablet Take 81 mg by mouth daily.   Yes [provider]  buPROPion (WELLBUTRIN XL) 150 MG 24 hr tablet bupropion HCl XL 150 mg 24 hr tablet, extended release 07/24/17  Yes [provider]  celecoxib (CELEBREX) 200 MG capsule Take 200 mg by mouth daily. 06/28/19  Yes [provider]  CRESTOR 20 MG tablet Take 10 mg by mouth daily.  05/19/14  Yes [provider]  doxazosin (CARDURA) 2 MG tablet Take 2 mg by mouth daily. 08/17/19  Yes [provider]  DULoxetine (CYMBALTA) 60 MG capsule Take 60 mg by mouth every morning.   Yes [provider]  famotidine (PEPCID) 20 MG tablet Take 20 mg by mouth daily.    Yes [provider]  levothyroxine (SYNTHROID, LEVOTHROID) 175 MCG tablet Take 175 mcg by mouth every morning.    Yes [provider]  Multiple Vitamin (MULTIVITAMIN) tablet Take 1 tablet by mouth daily.   Yes [provider]  Omega-3 Fatty Acids (FISH OIL) 1000 MG CAPS Take by mouth.   Yes [provider]  testosterone (ANDROGEL) 50 MG/5GM (1%) GEL Place 5 g onto the skin 2 (two) times daily.   Yes [provider]  valsartan-hydrochlorothiazide (DIOVAN-HCT) 320-25 MG per tablet Take 1 tablet by mouth every morning.    Yes [provider]    Family History Family History  Problem Relation Age of Onset  . Heart disease Father   . Hypertension Father     Social History Social History   Tobacco Use  . Smoking status: Former Smoker     Years: 30.00    Types: Cigarettes    Quit date: 12/05/2000    Years since quitting: 19.3  . Smokeless tobacco: Never Used  Vaping Use  . Vaping Use: Never used  Substance Use Topics  . Alcohol use: Yes    Comment: occassional  . Drug use: Never     Allergies   Sulfa antibiotics   Review of Systems Review of Systems  Constitutional: Positive for fatigue.  HENT: Positive for congestion and rhinorrhea.   Respiratory: Positive for shortness of breath.    Physical Exam Triage Vital Signs ED Triage Vitals  Enc Vitals Group     BP 04/18/20 1919 (!) 141/78     Pulse Rate 04/18/20 1919 65     Resp 04/18/20 1919 18     Temp 04/18/20 1919 98.4 F (36.9 C)     Temp Source 04/18/20 1919 Oral  SpO2 04/18/20 1919 100 %     Weight 04/18/20 1917 250 lb (113.4 kg)     Height 04/18/20 1917 6\' 2"  (1.88 m)     Head Circumference --      Peak Flow --      Pain Score 04/18/20 1917 0     Pain Loc --      Pain Edu? --      Excl. in [place]? --    Updated Vital Signs BP (!) 141/78 (BP Location: Right Arm)   Pulse 65   Temp 98.4 F (36.9 C) (Oral)   Resp 18   Ht 6\' 2"  (1.88 m)   Wt 113.4 kg   SpO2 100%   BMI 32.10 kg/m   Visual Acuity Right Eye Distance:   Left Eye Distance:   Bilateral Distance:    Right Eye Near:   Left Eye Near:    Bilateral Near:     Physical Exam Vitals and nursing note reviewed.  Constitutional:      General: He is not in acute distress.    Appearance: Normal appearance. He is obese. He is not ill-appearing.  HENT:     Head: Normocephalic and atraumatic.  Eyes:     General:        Right eye: No discharge.        Left eye: No discharge.     Conjunctiva/sclera: Conjunctivae normal.  Cardiovascular:     Rate and Rhythm: Normal rate and regular rhythm.  Pulmonary:     Effort: Pulmonary effort is normal.     Breath sounds: Normal breath sounds. No wheezing or rales.  Neurological:     Mental Status: He is alert.  Psychiatric:        Mood and  Affect: Mood normal.        Behavior: Behavior normal.     UC Treatments / Results  Labs (all labs ordered are listed, but only abnormal results are displayed) Labs Reviewed  CBC WITH DIFFERENTIAL/PLATELET - Abnormal; Notable for the following components:      Result Value   HCT 38.5 (*)    All other components within normal limits  FIBRIN DERIVATIVES D-DIMER ([facility] ONLY) - Abnormal; Notable for the following components:   Fibrin derivatives D-dimer ([facility]) 546.49 (*)    All other components within normal limits  SARS CORONAVIRUS 2 (TAT 6-24 HRS)    EKG   Radiology DG Chest 2 View  Result Date: 04/18/2020 CLINICAL DATA:  Cough, shortness of breath EXAM: CHEST - 2 VIEW COMPARISON:  06/29/2014 FINDINGS: Heart and mediastinal contours are within normal limits. No focal opacities or effusions. No acute bony abnormality. IMPRESSION: No active cardiopulmonary disease. Electronically Signed   By: Rolm Baptise M.D.   On: 04/18/2020 19:53    Procedures Procedures (including critical care time)  Medications Ordered in UC Medications - No data to display  Initial Impression / Assessment and Plan / UC Course  I have reviewed the triage vital signs and the nursing notes.  Pertinent labs & imaging results that were available during my care of the patient were reviewed by me and considered in my medical decision making (see chart for details).    64 year old male presents with SOB (and URI symptoms). Chest xray obtained and independently reviewed by me. Interpretation: Normal Xray. CBC unremarkable. D dimer elevated at 546. CT not available at [facility] this evening. Sending to ER for CTA.   Final Clinical Impressions(s) / UC Diagnoses  Final diagnoses:  Positive D dimer  SOB (shortness of breath)     Discharge Instructions     You need to go to the ER for CTA.  Best of luck  Take care  Dr. Lacinda Axon    ED Prescriptions    None     PDMP not reviewed this encounter.   Coral Spikes, DO 04/18/20 2300

## 2020-04-18 NOTE — ED Provider Notes (Signed)
[facility] DEPT Provider Note   CSN: [id number] Arrival date & time: 04/18/20  2140     History Chief Complaint  Patient presents with  . Shortness of Breath    Howard [name] is a 64 y.o. male with a history of OSA, hypertension, hyperlipidemia, GERD, T2DM, & hypothyroidism who presents to the ED from urgent care for CT imaging with complaints of URI sxs & dyspnea.   Patient states over the past 5 days he has had nasal congestion, sinus pain/pressure, ear fullness, as well as a dry cough.  He has been doing a lot of traveling in multiple different cities and has been in and out of the airports, he has noticed with his URI symptoms that he has had some increased work of breathing when he is trying to get through the airports with all of his luggage.  No other dyspnea noted.  No shortness of breath at present.  He denies fever, chills, chest pain, lightheadedness, dizziness, syncope, leg pain/swelling, hemoptysis, recent surgery/trauma, current chemo or recent chemo tx, or hx of DVT/PE.  Seen at urgent care for his symptoms and had a positive D-dimer therefore was sent to the emergency department for CT angio.  HPI     Past Medical History:  Diagnosis Date  . Anxiety   . Borderline diabetes   . Coronary artery disease    cardiologist-  dr Marlou Porch--- coronary CT 09-11-2017 showed non-obstructive mild diffuse cad RCA and ostial LAD and moderate midLAD,    . Disorder of tendon of right biceps    dislocation  . Family history of adverse reaction to anesthesia    FATHER REACTED TO LIDOCAINE   . GERD (gastroesophageal reflux disease)   . Gout    per pt stable  . History of esophageal spasm 2008  . History of nonmelanoma skin cancer   . History of nuclear stress test 02/17/2009   normal , no ishemia w/ ef 60%  . Hyperlipidemia   . Hypertension   . Hypogonadism male    followed by pcp  . Hypothyroidism    followed by pcp  . Nocturia   . OA  (osteoarthritis)   . OSA (obstructive sleep apnea)    per pt has not used for severeal yrs, currently using mouth appliance  . Right rotator cuff tear     Patient Active Problem List   Diagnosis Date Noted  . Obesity (BMI 30.0-34.9) 05/07/2019  . Severe obstructive sleep apnea-hypopnea syndrome 05/07/2019  . Excessive daytime sleepiness 05/07/2019  . Claudication ([place]) 04/14/2019  . Class 2 severe obesity with serious comorbidity and body mass index (BMI) of 37.0 to 37.9 in adult [facility]) 04/14/2019  . Angina pectoris ([place]) 04/14/2019  . OA (osteoarthritis) of knee 07/05/2014  . Acute medial meniscal tear 12/12/2011  . ACUTE SINUSITIS, UNSPECIFIED 11/18/2009  . DENTAL PAIN 11/18/2009  . HYPOGONADISM 02/15/2009  . PLANTAR FASCIITIS 06/17/2007  . OBSTRUCTIVE SLEEP APNEA 06/16/2007  . OTHER ACUTE SINUSITIS 05/09/2007  . HYPOTHYROIDISM 03/05/2007  . DIABETES MELLITUS, TYPE II 03/05/2007  . HYPERLIPIDEMIA 03/05/2007  . DEPRESSION 03/05/2007  . HYPERTENSION 03/05/2007  . OSTEOARTHRITIS 03/05/2007    Past Surgical History:  Procedure Laterality Date  . ANAL FISSURE REPAIR  yrs ago  . CARPAL TUNNEL RELEASE  2012 /2013   bil  . CATARACT EXTRACTION W/ INTRAOCULAR LENS  IMPLANT, BILATERAL  june 9 & 16-- 2020  . INJECTION KNEE Left 12/29/2018   Procedure: LEFT KNEE ASPIRATION AND INJECTION;  Surgeon: Tania Ade, MD;  Location: [facility];  Service: Orthopedics;  Laterality: Left;  . KNEE ARTHROSCOPY  12/12/2011   Procedure: ARTHROSCOPY KNEE;  Surgeon: Gearlean Alf, MD;  Location: [facility];  Service: Orthopedics;  Laterality: Left;  medial menisectomy and chondroplasty  . KNEE ARTHROSCOPY Left 10/29/2012   Procedure: LEFT ARTHROSCOPY KNEE WITH CHONDROPLASTY AND DEBRIDMENT OF MENISCUS;  Surgeon: Gearlean Alf, MD;  Location: [facility];  Service: Orthopedics;  Laterality: Left;  . KNEE SURGERY Bilateral right 1973 /  left 1985 /   right 1993  . PLANTAR FASCIA SURGERY Left  08-02-2008    dr Beola Cord  @[facility]   . SHOULDER ARTHROSCOPY WITH ROTATOR CUFF REPAIR Right 12/29/2018   Procedure: SHOULDER ARTHROSCOPY WITH ROTATOR CUFF REPAIR and  SUBACROMIAL DECOMPRESSION AND BICEP TENOTOMY;  Surgeon: Tania Ade, MD;  Location: [facility];  Service: Orthopedics;  Laterality: Right;  . SHOULDER ARTHROSCOPY WITH ROTATOR CUFF REPAIR AND SUBACROMIAL DECOMPRESSION Right 04-09-2009   dr supple  @[facility]    w/ labral debridement and dcr  . TOTAL KNEE ARTHROPLASTY Right 07/05/2014   Procedure: RIGHT TOTAL KNEE ARTHROPLASTY;  Surgeon: Gearlean Alf, MD;  Location: [facility];  Service: Orthopedics;  Laterality: Right;       Family History  Problem Relation Age of Onset  . Heart disease Father   . Hypertension Father     Social History   Tobacco Use  . Smoking status: Former Smoker    Years: 30.00    Types: Cigarettes    Quit date: 12/05/2000    Years since quitting: 19.3  . Smokeless tobacco: Never Used  Vaping Use  . Vaping Use: Never used  Substance Use Topics  . Alcohol use: Yes    Comment: occassional  . Drug use: Never    Home Medications Prior to Admission medications   Medication Sig Start Date End Date Taking? Authorizing Provider  allopurinol (ZYLOPRIM) 300 MG tablet Take 300 mg by mouth every morning.     [provider]  amLODipine (NORVASC) 10 MG tablet Take 10 mg by mouth daily. 02/19/19   [provider]  aspirin EC 81 MG tablet Take 81 mg by mouth daily.    [provider]  buPROPion (WELLBUTRIN XL) 150 MG 24 hr tablet bupropion HCl XL 150 mg 24 hr tablet, extended release 07/24/17   [provider]  celecoxib (CELEBREX) 200 MG capsule Take 200 mg by mouth daily. 06/28/19   [provider]  CRESTOR 20 MG tablet Take 10 mg by mouth daily.  05/19/14   [provider]  doxazosin (CARDURA) 2 MG tablet Take 2 mg by mouth daily. 08/17/19   [provider]  DULoxetine (CYMBALTA) 60 MG  capsule Take 60 mg by mouth every morning.    [provider]  famotidine (PEPCID) 20 MG tablet Take 20 mg by mouth daily.     [provider]  levothyroxine (SYNTHROID, LEVOTHROID) 175 MCG tablet Take 175 mcg by mouth every morning.     [provider]  Multiple Vitamin (MULTIVITAMIN) tablet Take 1 tablet by mouth daily.    [provider]  Omega-3 Fatty Acids (FISH OIL) 1000 MG CAPS Take by mouth.    [provider]  sildenafil (VIAGRA) 100 MG tablet Take 1 tablet by mouth once as needed. 12/24/19   [provider]  testosterone (ANDROGEL) 50 MG/5GM (1%) GEL Place 5 g onto the skin 2 (two) times daily.    [provider]  valsartan-hydrochlorothiazide (DIOVAN-HCT) 320-25 MG per tablet Take 1 tablet by mouth every morning.     [provider]    Allergies    Sulfa antibiotics  Review of Systems   Review of Systems  Constitutional: Negative for chills and fever.  HENT: Positive for congestion, sinus pressure and sinus pain.   Respiratory: Positive for cough and shortness of breath.   Cardiovascular: Negative for chest pain and leg swelling.  Gastrointestinal: Negative for abdominal pain, blood in stool, constipation and vomiting.  Genitourinary: Negative for dysuria.  Neurological: Negative for syncope.  All other systems reviewed and are negative.   Physical Exam Updated Vital Signs BP (!) 170/91   Pulse 65   Temp (!) 97.5 F (36.4 C) (Oral)   Resp 16   Ht 6\' 2"  (1.88 m)   Wt 113.4 kg   SpO2 97%   BMI 32.10 kg/m   Physical Exam Vitals and nursing note reviewed.  Constitutional:      General: He is not in acute distress.    Appearance: He is well-developed. He is not toxic-appearing.  HENT:     Head: Normocephalic and atraumatic.     Right Ear: Ear canal normal. Tympanic membrane is not perforated, erythematous, retracted or bulging.     Left Ear: Ear canal normal. Tympanic membrane is not perforated,  erythematous, retracted or bulging.     Ears:     Comments: No mastoid erythema/swellng/tenderness.     Nose: Congestion present.     Right Sinus: Maxillary sinus tenderness present. No frontal sinus tenderness.     Left Sinus: Maxillary sinus tenderness present. No frontal sinus tenderness.     Mouth/Throat:     Pharynx: Oropharynx is clear. Uvula midline. No oropharyngeal exudate or posterior oropharyngeal erythema.     Comments: Posterior oropharynx is symmetric appearing. Patient tolerating own secretions without difficulty. No trismus. No drooling. No hot potato voice. No swelling beneath the tongue, submandibular compartment is soft.  Eyes:     General:        Right eye: No discharge.        Left eye: No discharge.     Conjunctiva/sclera: Conjunctivae normal.  Cardiovascular:     Rate and Rhythm: Normal rate and regular rhythm.  Pulmonary:     Effort: Pulmonary effort is normal. No respiratory distress.     Breath sounds: Normal breath sounds. No wheezing, rhonchi or rales.  Abdominal:     General: There is no distension.     Palpations: Abdomen is soft.     Tenderness: There is no abdominal tenderness.  Musculoskeletal:     Cervical back: Neck supple. No rigidity.     Comments: No asymmetric pitting edema noted.  No calf tenderness.  Lymphadenopathy:     Cervical: No cervical adenopathy.  Skin:    General: Skin is warm and dry.     Findings: No rash.  Neurological:     Mental Status: He is alert.  Psychiatric:        Behavior: Behavior normal.     ED Results / Procedures / Treatments   Labs (all labs ordered are listed, but only abnormal results are displayed) Labs Reviewed  BASIC METABOLIC PANEL  TROPONIN I (HIGH SENSITIVITY)    EKG None  Radiology DG Chest 2 View  Result Date: 04/18/2020 CLINICAL DATA:  Cough, shortness of breath EXAM: CHEST - 2 VIEW COMPARISON:  06/29/2014 FINDINGS: Heart and mediastinal contours are within normal limits. No focal  opacities or effusions. No acute bony abnormality. IMPRESSION: No active cardiopulmonary disease. Electronically Signed   By: Rolm Baptise M.D.   On: 04/18/2020 19:53   CT Angio Chest PE W/Cm &/Or Wo Cm  Result Date: 04/19/2020 CLINICAL DATA:  Dyspnea, chest congestion, elevated D-dimer EXAM: CT ANGIOGRAPHY CHEST WITH CONTRAST TECHNIQUE: Multidetector CT imaging of the chest was performed using the standard protocol during bolus administration of intravenous contrast. Multiplanar CT image reconstructions and MIPs were obtained to evaluate the vascular anatomy. CONTRAST:  156mL OMNIPAQUE IOHEXOL 350 MG/ML SOLN COMPARISON:  None FINDINGS: Cardiovascular: There is adequate opacification of the pulmonary arterial tree through the segmental level. No intraluminal filling defect identified to suggest acute pulmonary embolism. The central pulmonary arteries are of normal caliber. Mild coronary artery calcification. Global cardiac size within normal limits. No pericardial effusion the thoracic aorta is unremarkable. Mediastinum/Nodes: No enlarged mediastinal, hilar, or axillary lymph nodes. Thyroid gland, trachea, and esophagus demonstrate no significant findings. Lungs/Pleura: Mild paraseptal emphysema. No confluent pulmonary infiltrate. No pneumothorax or pleural effusion. Central airways are widely patent. Upper Abdomen: No acute abnormality. Musculoskeletal: No chest wall abnormality. No acute or significant osseous findings. Review of the MIP images confirms the above findings. IMPRESSION: No pulmonary embolism. Mild coronary artery calcification. Mild paraseptal emphysema. Emphysema (ICD10-J43.9). Electronically Signed   By: Fidela Salisbury MD   On: 04/19/2020 00:34    Procedures Procedures (including critical care time)  Medications Ordered in ED Medications - No data to display  ED Course  I have reviewed the triage vital signs and the nursing notes.  Pertinent labs & imaging results that were  available during my care of the patient were reviewed by me and considered in my medical decision making (see chart for details).    MDM Rules/Calculators/A&P                         Patient presents to the ED with complaints of URI sxs & dyspnea, sent from urgent care for CT imaging. Patient is nontoxic, BP elevated, vitals otherwise fairly unremarkable. Nasal congestion & sinus tenderness on exam, otherwise benign.   Additional history obtained:  Additional history obtained from chart review & nursing note revew. Previous records obtained and reviewed - had covid test @ [facility]- pending,  CBC- unremarkable, D-dimer- mild elevation.  EKG: No STEMI.  Lab Tests:  I Ordered, reviewed, and interpreted labs, which included:  BMP: Fairly unremarkable Troponin: WNL  Low suspicion for PE, & d-dimer could be age adjusted, however given this was primary concern & sent to ED for imaging I have ordered CTA.   Imaging Studies ordered:  I ordered imaging studies which included CTA of the chest, I independently visualized and interpreted imaging which showed  No pulmonary embolism. Mild coronary artery calcification. Mild paraseptal emphysema. Emphysema.   CT angio negative for PE.  No significant chest pain, troponin within normal limits, I have a low suspicion for ACS.  No pneumonia on CT imaging.  He has not been in any respiratory distress or had dyspnea in the ED. Have a high suspicion for viral versus allergic etiology process.  Patient stating that this is similar to sinus infections requiring antibiotics.  Will treat with Flonase and Tessalon, discussed watch and wait, abx prescription provided should he not have improvement within the next 48 hours or start to worsen. I discussed results, treatment plan, need for follow-up, and return precautions with the patient. Provided opportunity for questions, patient  confirmed understanding and is in agreement with plan.   Portions of this note were generated with  [employer]. Dictation errors may occur despite best attempts at proofreading.  Final Clinical Impression(s) / ED Diagnoses Final diagnoses:  Cough  Sinus congestion    Rx / DC Orders ED Discharge Orders         Ordered    fluticasone (FLONASE) 50 MCG/ACT nasal spray  Daily        04/19/20 0201    benzonatate (TESSALON) 100 MG capsule  Every 8 hours,   Status:  Discontinued        04/19/20 0201    benzonatate (TESSALON) 100 MG capsule  Every 8 hours        04/19/20 0202    amoxicillin-clavulanate (AUGMENTIN) 875-125 MG tablet  Every 12 hours        04/19/20 0206           Brando Taves, Glynda Jaeger, PA-C 04/19/20 0210    Drenda Freeze, MD 04/20/20 1510

## 2020-04-18 NOTE — Discharge Instructions (Signed)
You need to go to the ER for CTA.  Best of luck  Take care  Dr. Lacinda Axon

## 2020-04-18 NOTE — ED Notes (Signed)
Patient is being discharged from the Urgent Care and sent to the Emergency Department via self (wife driving) . Per Lacinda Axon MD, patient is in need of higher level of care due to D-Dimer elevation, SOB, and possible PE. Patient is aware and verbalizes understanding of plan of care.  Vitals:   04/18/20 1919  BP: (!) 141/78  Pulse: 65  Resp: 18  Temp: 98.4 F (36.9 C)  SpO2: 100%

## 2020-04-19 ENCOUNTER — Encounter (HOSPITAL_COMMUNITY): Payer: Self-pay

## 2020-04-19 ENCOUNTER — Emergency Department (HOSPITAL_COMMUNITY): Payer: Medicare Other

## 2020-04-19 LAB — BASIC METABOLIC PANEL
Anion gap: 9 (ref 5–15)
BUN: 15 mg/dL (ref 8–23)
CO2: 27 mmol/L (ref 22–32)
Calcium: 9 mg/dL (ref 8.9–10.3)
Chloride: 99 mmol/L (ref 98–111)
Creatinine, Ser: 0.92 mg/dL (ref 0.61–1.24)
GFR, Estimated: >60 mL/min (ref >60)
Glucose, Bld: 123 mg/dL — ABNORMAL HIGH (ref 70–99)
Potassium: 3.8 mmol/L (ref 3.5–5.1)
Sodium: 135 mmol/L (ref 135–145)

## 2020-04-19 LAB — SARS CORONAVIRUS 2 (TAT 6-24 HRS): SARS Coronavirus 2: POSITIVE — AB

## 2020-04-19 LAB — TROPONIN I (HIGH SENSITIVITY): Troponin I (High Sensitivity): 5 ng/L (ref <18)

## 2020-04-19 MED ORDER — BENZONATATE 100 MG PO CAPS: 100.0000 mg | ORAL_CAPSULE | Freq: Three times a day (TID) | ORAL | 0 refills | Status: DC

## 2020-04-19 MED ORDER — FLUTICASONE PROPIONATE 50 MCG/ACT NA SUSP: 1.0000 | Freq: Every day | NASAL | 0 refills | Status: DC

## 2020-04-19 MED ORDER — AMOXICILLIN-POT CLAVULANATE 875-125 MG PO TABS: 1.0000 | ORAL_TABLET | Freq: Two times a day (BID) | ORAL | 0 refills | Status: DC

## 2020-04-19 MED ORDER — IOHEXOL 350 MG/ML SOLN
100.0000 mL | Freq: Once | INTRAVENOUS | Status: AC | PRN
  Administered 2020-04-19: 100 mL via INTRAVENOUS

## 2020-04-19 NOTE — Discharge Instructions (Addendum)
You were seen in the ER today to evaluate for a blood clot.  Your CT scan did not show findings of a blood clot.  It does show some chronic lung findings as well as some plaque in your heart arteries which has been seen on prior imaging.  Suspect your symptoms are likely related to allergies or a virus.  Please quarantine until you have your COVID-19 test results.  We are sending you home with Flonase to use 1 spray per nostril daily as needed for congestion and Tessalon to take every 8 hours as needed for coughing.  If your symptoms do not improve within 48 hours or gets significantly worse he may start Augmentin, an antibiotic, to treat for a bacterial infection.  We have prescribed you new medication(s) today. Discuss the medications prescribed today with your pharmacist as they can have adverse effects and interactions with your other medicines including over the counter and prescribed medications. Seek medical evaluation if you start to experience new or abnormal symptoms after taking one of these medicines, seek care immediately if you start to experience difficulty breathing, feeling of your throat closing, facial swelling, or rash as these could be indications of a more serious allergic reaction  Please follow-up with your primary care provider within 3 days for reevaluation.  Return to the ER for new or worsening symptoms including but not limited to worsening pain, trouble breathing, fever, chest pain, passing out, or any other concerns.

## 2020-04-20 ENCOUNTER — Telehealth: Payer: Self-pay | Admitting: Oncology

## 2020-04-20 ENCOUNTER — Other Ambulatory Visit: Payer: Self-pay | Admitting: Oncology

## 2020-04-20 ENCOUNTER — Ambulatory Visit (HOSPITAL_COMMUNITY)
Admission: RE | Admit: 2020-04-20 | Discharge: 2020-04-20 | Disposition: A | Discharge status: Home / Self Care | Payer: Medicare Other | Source: Ambulatory Visit | Attending: Pulmonary Disease | Admitting: Pulmonary Disease

## 2020-04-20 ENCOUNTER — Encounter: Payer: Self-pay | Admitting: Oncology

## 2020-04-20 DIAGNOSIS — U071 COVID-19: Secondary | ICD-10-CM | POA: Diagnosis present

## 2020-04-20 DIAGNOSIS — Z23 Encounter for immunization: Secondary | ICD-10-CM | POA: Diagnosis not present

## 2020-04-20 MED ORDER — DIPHENHYDRAMINE HCL 50 MG/ML IJ SOLN: 50.0000 mg | Freq: Once | INTRAMUSCULAR | Status: DC | PRN

## 2020-04-20 MED ORDER — ALBUTEROL SULFATE HFA 108 (90 BASE) MCG/ACT IN AERS: 2.0000 | INHALATION_SPRAY | Freq: Once | RESPIRATORY_TRACT | Status: DC | PRN

## 2020-04-20 MED ORDER — EPINEPHRINE 0.3 MG/0.3ML IJ SOAJ: 0.3000 mg | Freq: Once | INTRAMUSCULAR | Status: DC | PRN

## 2020-04-20 MED ORDER — SOTROVIMAB 500 MG/8ML IV SOLN
500.0000 mg | Freq: Once | INTRAVENOUS | Status: AC
  Administered 2020-04-20: 500 mg via INTRAVENOUS

## 2020-04-20 MED ORDER — METHYLPREDNISOLONE SODIUM SUCC 125 MG IJ SOLR: 125.0000 mg | Freq: Once | INTRAMUSCULAR | Status: DC | PRN

## 2020-04-20 MED ORDER — SODIUM CHLORIDE 0.9 % IV SOLN: INTRAVENOUS | Status: DC | PRN

## 2020-04-20 MED ORDER — FAMOTIDINE IN NACL 20-0.9 MG/50ML-% IV SOLN: 20.0000 mg | Freq: Once | INTRAVENOUS | Status: DC | PRN

## 2020-04-20 NOTE — Telephone Encounter (Signed)
I connected by phone with Mr. Normington to discuss the potential use of an new treatment for mild to moderate COVID-19 viral infection in non-hospitalized patients.   This patient is a age/sex that meets the FDA criteria for Emergency Use Authorization of casirivimab\imdevimab.  Has a (+) direct SARS-CoV-2 viral test result 1. Has mild or moderate COVID-19  2. Is ? 66 years of age and weighs ? 40 kg 3. Is NOT hospitalized due to COVID-19 4. Is NOT requiring oxygen therapy or requiring an increase in baseline oxygen flow rate due to COVID-19 5. Is within 10 days of symptom onset 6. Has at least one of the high risk factor(s) for progression to severe COVID-19 and/or hospitalization as defined in EUA. Specific high risk criteria :  Past Medical History:  Diagnosis Date  . Anxiety   . Borderline diabetes   . Coronary artery disease    cardiologist-  dr Marlou Porch--- coronary CT 09-11-2017 showed non-obstructive mild diffuse cad RCA and ostial LAD and moderate midLAD,    . Disorder of tendon of right biceps    dislocation  . Family history of adverse reaction to anesthesia    FATHER REACTED TO LIDOCAINE   . GERD (gastroesophageal reflux disease)   . Gout    per pt stable  . History of esophageal spasm 2008  . History of nonmelanoma skin cancer   . History of nuclear stress test 02/17/2009   normal , no ishemia w/ ef 60%  . Hyperlipidemia   . Hypertension   . Hypogonadism male    followed by pcp  . Hypothyroidism    followed by pcp  . Nocturia   . OA (osteoarthritis)   . OSA (obstructive sleep apnea)    per pt has not used for severeal yrs, currently using mouth appliance  . Right rotator cuff tear   ?  ?    Symptom onset 04/16/20   I have spoken and communicated the following to the patient or parent/caregiver:   1. FDA has authorized the emergency use of casirivimab\imdevimab for the treatment of mild to moderate COVID-19 in adults and pediatric patients with positive results  of direct SARS-CoV-2 viral testing who are 59 years of age and older weighing at least 40 kg, and who are at high risk for progressing to severe COVID-19 and/or hospitalization.   2. The significant known and potential risks and benefits of casirivimab\imdevimab, and the extent to which such potential risks and benefits are unknown.   3. Information on available alternative treatments and the risks and benefits of those alternatives, including clinical trials.   4. Patients treated with casirivimab\imdevimab should continue to self-isolate and use infection control measures (e.g., wear mask, isolate, social distance, avoid sharing personal items, clean and disinfect "high touch" surfaces, and frequent handwashing) according to CDC guidelines.    5. The patient or parent/caregiver has the option to accept or refuse casirivimab\imdevimab .   After reviewing this information with the patient, The patient agreed to proceed with receiving casirivimab\imdevimab infusion and will be provided a copy of the Fact sheet prior to receiving the infusion.Rulon Abide, AGNP-C [phone] ([place])

## 2020-04-20 NOTE — Progress Notes (Signed)
Diagnosis: COVID-19  Physician: Dr. Wright  Procedure: Sotrovimab  Complications: No immediate complications noted.  Discharge: Discharged home   Amberli Ruegg J Kimball Manske 04/20/2020 

## 2020-04-20 NOTE — Discharge Instructions (Signed)

## 2020-11-07 NOTE — Patient Instructions (Incomplete)
Please continue using your CPAP regularly. While your insurance requires that you use CPAP at least 4 hours each night on 70% of the nights, I recommend, that you not skip any nights and use it throughout the night if you can. Getting used to CPAP and staying with the treatment long term does take time and patience and discipline. Untreated obstructive sleep apnea when it is moderate to severe can have an adverse impact on cardiovascular health and raise her risk for heart disease, arrhythmias, hypertension, congestive heart failure, stroke and diabetes. Untreated obstructive sleep apnea causes sleep disruption, nonrestorative sleep, and sleep deprivation. This can have an impact on your day to day functioning and cause daytime sleepiness and impairment of cognitive function, memory loss, mood disturbance, and problems focussing. Using CPAP regularly can improve these symptoms.   Follow up in   Sleep Apnea Sleep apnea affects breathing during sleep. It causes breathing to stop for a short time or to become shallow. It can also increase the risk of:  Heart attack.  Stroke.  Being very overweight (obese).  Diabetes.  Heart failure.  Irregular heartbeat. The goal of treatment is to help you breathe normally again. What are the causes? There are three kinds of sleep apnea:  Obstructive sleep apnea. This is caused by a blocked or collapsed airway.  Central sleep apnea. This happens when the brain does not send the right signals to the muscles that control breathing.  Mixed sleep apnea. This is a combination of obstructive and central sleep apnea. The most common cause of this condition is a collapsed or blocked airway. This can happen if:  Your throat muscles are too relaxed.  Your tongue and tonsils are too large.  You are overweight.  Your airway is too small.   What increases the risk?  Being overweight.  Smoking.  Having a small airway.  Being older.  Being  male.  Drinking alcohol.  Taking medicines to calm yourself (sedatives or tranquilizers).  Having family members with the condition. What are the signs or symptoms?  Trouble staying asleep.  Being sleepy or tired during the day.  Getting angry a lot.  Loud snoring.  Headaches in the morning.  Not being able to focus your mind (concentrate).  Forgetting things.  Less interest in sex.  Mood swings.  Personality changes.  Feelings of sadness (depression).  Waking up a lot during the night to pee (urinate).  Dry mouth.  Sore throat. How is this diagnosed?  Your medical history.  A physical exam.  A test that is done when you are sleeping (sleep study). The test is most often done in a sleep lab but may also be done at home. How is this treated?  Sleeping on your side.  Using a medicine to get rid of mucus in your nose (decongestant).  Avoiding the use of alcohol, medicines to help you relax, or certain pain medicines (narcotics).  Losing weight, if needed.  Changing your diet.  Not smoking.  Using a machine to open your airway while you sleep, such as: ? An oral appliance. This is a mouthpiece that shifts your lower jaw forward. ? A CPAP device. This device blows air through a mask when you breathe out (exhale). ? An EPAP device. This has valves that you put in each nostril. ? A BPAP device. This device blows air through a mask when you breathe in (inhale) and breathe out.  Having surgery if other treatments do not work. It is important  to get treatment for sleep apnea. Without treatment, it can lead to:  High blood pressure.  Coronary artery disease.  In men, not being able to have an erection (impotence).  Reduced thinking ability.   Follow these instructions at home: Lifestyle  Make changes that your doctor recommends.  Eat a healthy diet.  Lose weight if needed.  Avoid alcohol, medicines to help you relax, and some pain  medicines.  Do not use any products that contain nicotine or tobacco, such as cigarettes, e-cigarettes, and chewing tobacco. If you need help quitting, ask your doctor. General instructions  Take over-the-counter and prescription medicines only as told by your doctor.  If you were given a machine to use while you sleep, use it only as told by your doctor.  If you are having surgery, make sure to tell your doctor you have sleep apnea. You may need to bring your device with you.  Keep all follow-up visits as told by your doctor. This is important. Contact a doctor if:  The machine that you were given to use during sleep bothers you or does not seem to be working.  You do not get better.  You get worse. Get help right away if:  Your chest hurts.  You have trouble breathing in enough air.  You have an uncomfortable feeling in your back, arms, or stomach.  You have trouble talking.  One side of your body feels weak.  A part of your face is hanging down. These symptoms may be an emergency. Do not wait to see if the symptoms will go away. Get medical help right away. Call your local emergency services (911 in the [place] Do not drive yourself to the hospital. Summary  This condition affects breathing during sleep.  The most common cause is a collapsed or blocked airway.  The goal of treatment is to help you breathe normally while you sleep. This information is not intended to replace advice given to you by your health care provider. Make sure you discuss any questions you have with your health care provider. Document Revised: 03/21/2018 Document Reviewed: 01/28/2018 Elsevier Patient Education  [place].

## 2020-11-07 NOTE — Progress Notes (Deleted)
PATIENT: Howard [name] DOB: [birth year]  REASON FOR VISIT: follow up HISTORY FROM: patient  No chief complaint on file.    HISTORY OF PRESENT ILLNESS: 11/07/20 ALL: Howard [name] returns for follow up for OSA on CPAP.      11/09/2019 ALL:  Howard [name] is a 65 y.o. male here today for follow up for OSA on CPAP. He was sent for mask refitting at last visit due to elevated leak.  He admits that although the DME company has reached out to him, he has not pursued a mask refitting.  He does continue to note a leak on his machine.  He has not used his machine to help identify correct mask seal.  He does continue to note improvement in sleep quality and daytime energy.  Compliance report dated 10/06/2019 through 11/04/2019 reveals that he used CPAP 29 of the past 30 days for compliance of 97%.  He used CPAP greater than 4 hours 25 of the past 30 days for compliance of 83%.  Average usage was 5 hours and 44 minutes.  Residual AHI was 2.8 on 8 to 18 cm of water and an EPR of 1.  There is a significant leak in the 95th percentile of 67.3 L/min.  HISTORY: (copied from my note on 08/10/2019)  Howard [name] is a 65 y.o. male here today for follow up for OSA on CPAP. Sleep study in 04/2019 revealed severe OSA with AHI of 77.2 and hypoxemia for 153 minutes. O2 nadir of 62%. Autopap ordered with large FFM and optional humidity with pressure settings of 8-18cmH20.  He has noted significant improvements in sleep quality and improvement in fatigue since starting CPAP therapy.  Compliance report dated 07/11/2019 through 08/09/2019 reveals that he used CPAP 29 of the last 30 days for compliance of 97%.  He used CPAP greater than 4 hours 29 of the last 30 days.  Average usage was 6 hours and 58 minutes.  Residual AHI was 4.7 on 8 to 18 cm of water and an EPR of 1.  There was a very high leak noted in the 95th percentile of 60.4 L/min.  He states that he has had a lot of difficulty with the air leaking  around his mask.  He notes that he wakes in the morning with the bottom of the mask in his mouth.  HISTORY: (copied from Dr Edwena Felty note on 04/14/2019)  Howard Coin McCormickis a63 y.o.year old Caucasianmalepatientseenon10/27/2020upon referralfromDr. Perinifor asleep consultation. Chiefconcernaccording to patient :" I have a dental devicebut I am very sleepy"  I have the pleasure of seeingWilliam R McCormicktoday,aright-handedCaucasian malewith a possible sleep disorder. Hehas a past medical history of Anxiety, Borderline diabetes, Coronary artery disease, Disorder of tendon of right biceps, Family history of adverse reaction to anesthesia, GERD (gastroesophageal reflux disease), Gout, History of esophageal spasm (2008), History of non-melanoma( basalioma)skin cancer, History of nuclear stress test (02/17/2009), Hyperlipidemia, Hypertension, Hypogonadism male, Hypothyroidism, Nocturia, OA (osteoarthritis), OSA (obstructive sleep apnea), and Right rotator cuff tear.  The patient had the first sleep study in the year 2010 at [facility] - severe apnea) - and later a home study through his dentist,with a result of an AHI ( Apnea Hypopnea index) Indicating OSA. He has been using a dental device since , but feels he is sleepier again. Sleeprelevant medical history:former smoker, shift worker, gout and arthritis pain, neck and toe, shoulder rotator cuff injuries, knee replacement with pain.Nocturia twice- No Tonsillectomy, cervical spinetrauma,deviated septum repair.Familymedical /  sleep history:noother family member on CPAP with OSA, father was a snorer.  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 Family status is married with 2 adult sons, 50 and 41, andgrandchildren.Both sons out of state. The patient currently works/ used to work in shifts(  Chief Technology Officer,) Pets are present- 2 cats. Tobacco use: former , 25 years. ETOH use: socially, 3/ week Caffeine intake in form of Coffee(3 cups) Soda( some) Tea (at outside dinner) or energy drinks. Regular exercise in form ofwalking/ in PT for the shoulder.   Sleep habits are as follows:The patient's dinner time is between6- 6.30PM. He watches Tv and often is asleep in the den in a recliner by 9.30/ The patient goes to bed at11PM and continues to sleep for severalhours, wakes for bathroom breaks, the first time at 2-3AM and again 4-5 AM..  The preferred sleep position isleft side ( caveat is shoulder pain), with the support of1pillow. Dreams are reportedly frequent , but not acting out. He has called out in his sleep- rarely. 7AM is the usual rise time.  The patient wakes up spontaneously.Gets 8 hours of sleep- butreports not feeling refreshed or restored in AM, with symptoms such as dry mouth, nomorning headaches,and residual fatigue.  Naps are takenafter lunchinfrequently, lasting from 30to and are more refreshing than nocturnal sleep.   REVIEW OF SYSTEMS: Out of a complete 14 system review of symptoms, the patient complains only of the following symptoms, none and all other reviewed systems are negative.  ESS: 10 FSS: 45  ALLERGIES: Allergies  Allergen Reactions  . Sulfa Antibiotics Rash    HOME MEDICATIONS: Outpatient Medications Prior to Visit  Medication Sig Dispense Refill  . allopurinol (ZYLOPRIM) 300 MG tablet Take 300 mg by mouth every morning.     Marland Kitchen amLODipine (NORVASC) 10 MG tablet Take 10 mg by mouth daily.    Marland Kitchen amoxicillin-clavulanate (AUGMENTIN) 875-125 MG tablet Take 1 tablet by mouth every 12 (twelve) hours. 14 tablet 0  . aspirin EC 81 MG tablet Take 81 mg by mouth daily.    . benzonatate (TESSALON) 100 MG capsule Take 1 capsule (100 mg total) by mouth every 8 (eight) hours. 21 capsule 0  . buPROPion (WELLBUTRIN XL) 150  MG 24 hr tablet bupropion HCl XL 150 mg 24 hr tablet, extended release    . celecoxib (CELEBREX) 200 MG capsule Take 200 mg by mouth daily.    . CRESTOR 20 MG tablet Take 10 mg by mouth daily.   0  . doxazosin (CARDURA) 2 MG tablet Take 2 mg by mouth daily.    . DULoxetine (CYMBALTA) 60 MG capsule Take 60 mg by mouth every morning.    . famotidine (PEPCID) 20 MG tablet Take 20 mg by mouth daily.     . fluticasone (FLONASE) 50 MCG/ACT nasal spray Place 1 spray into both nostrils daily. 16 g 0  . levothyroxine (SYNTHROID, LEVOTHROID) 175 MCG tablet Take 175 mcg by mouth every morning.     . Multiple Vitamin (MULTIVITAMIN) tablet Take 1 tablet by mouth daily.    . Omega-3 Fatty Acids (FISH OIL) 1000 MG CAPS Take by mouth.    . sildenafil (VIAGRA) 100 MG tablet Take 50 mg by mouth once as needed.     . testosterone (ANDROGEL) 50 MG/5GM (1%) GEL Place 5 g onto the skin 2 (two) times daily.     No facility-administered medications prior to visit.    PAST  MEDICAL HISTORY: Past Medical History:  Diagnosis Date  . Anxiety   . Borderline diabetes   . Coronary artery disease    cardiologist-  dr Marlou Porch--- coronary CT 09-11-2017 showed non-obstructive mild diffuse cad RCA and ostial LAD and moderate midLAD,    . Disorder of tendon of right biceps    dislocation  . Family history of adverse reaction to anesthesia    FATHER REACTED TO LIDOCAINE   . GERD (gastroesophageal reflux disease)   . Gout    per pt stable  . History of esophageal spasm 2008  . History of nonmelanoma skin cancer   . History of nuclear stress test 02/17/2009   normal , no ishemia w/ ef 60%  . Hyperlipidemia   . Hypertension   . Hypogonadism male    followed by pcp  . Hypothyroidism    followed by pcp  . Nocturia   . OA (osteoarthritis)   . OSA (obstructive sleep apnea)    per pt has not used for severeal yrs, currently using mouth appliance  . Right rotator cuff tear     PAST SURGICAL HISTORY: Past Surgical  History:  Procedure Laterality Date  . ANAL FISSURE REPAIR  yrs ago  . CARPAL TUNNEL RELEASE  2012 /2013   bil  . CATARACT EXTRACTION W/ INTRAOCULAR LENS  IMPLANT, BILATERAL  june 9 & 16-- 2020  . INJECTION KNEE Left 12/29/2018   Procedure: LEFT KNEE ASPIRATION AND INJECTION;  Surgeon: Tania Ade, MD;  Location: [facility];  Service: Orthopedics;  Laterality: Left;  . KNEE ARTHROSCOPY  12/12/2011   Procedure: ARTHROSCOPY KNEE;  Surgeon: Gearlean Alf, MD;  Location: [facility];  Service: Orthopedics;  Laterality: Left;  medial menisectomy and chondroplasty  . KNEE ARTHROSCOPY Left 10/29/2012   Procedure: LEFT ARTHROSCOPY KNEE WITH CHONDROPLASTY AND DEBRIDMENT OF MENISCUS;  Surgeon: Gearlean Alf, MD;  Location: [facility];  Service: Orthopedics;  Laterality: Left;  . KNEE SURGERY Bilateral right 1973 /  left 1985 /   right 1993  . PLANTAR FASCIA SURGERY Left 08-02-2008    dr Beola Cord  @[facility]   . SHOULDER ARTHROSCOPY WITH ROTATOR CUFF REPAIR Right 12/29/2018   Procedure: SHOULDER ARTHROSCOPY WITH ROTATOR CUFF REPAIR and  SUBACROMIAL DECOMPRESSION AND BICEP TENOTOMY;  Surgeon: Tania Ade, MD;  Location: [place];  Service: Orthopedics;  Laterality: Right;  . SHOULDER ARTHROSCOPY WITH ROTATOR CUFF REPAIR AND SUBACROMIAL DECOMPRESSION Right 04-09-2009   dr supple  @[facility]    w/ labral debridement and dcr  . TOTAL KNEE ARTHROPLASTY Right 07/05/2014   Procedure: RIGHT TOTAL KNEE ARTHROPLASTY;  Surgeon: Gearlean Alf, MD;  Location: [facility];  Service: Orthopedics;  Laterality: Right;    FAMILY HISTORY: Family History  Problem Relation Age of Onset  . Heart disease Father   . Hypertension Father     SOCIAL HISTORY: Social History   Socioeconomic History  . Marital status: Married    Spouse name: Not on file  . Number of children: Not on file  . Years of education: Not on file  . Highest education level: Not on file  Occupational History  . Not on file   Tobacco Use  . Smoking status: Former Smoker    Years: 30.00    Types: Cigarettes    Quit date: 12/05/2000    Years since quitting: 19.9  . Smokeless tobacco: Never Used  Vaping Use  . Vaping Use: Never used  Substance and Sexual Activity  . Alcohol use: Yes  Comment: occassional  . Drug use: Never  . Sexual activity: Not on file  Other Topics Concern  . Not on file  Social History Narrative  . Not on file   Social Determinants of Health   Financial Resource Strain: Not on file  Food Insecurity: Not on file  Transportation Needs: Not on file  Physical Activity: Not on file  Stress: Not on file  Social Connections: Not on file  Intimate Partner Violence: Not on file      PHYSICAL EXAM  There were no vitals filed for this visit. There is no height or weight on file to calculate BMI.  Generalized: Well developed, in no acute distress  Cardiology: normal rate and rhythm, no murmur noted Respiratory: clear to auscultation bilaterally  Neurological examination  Mentation: Alert oriented to time, place, history taking. Follows all commands speech and language fluent Cranial nerve II-XII: Pupils were equal round reactive to light. Extraocular movements were full Motor: The motor testing reveals 5 over 5 strength of all 4 extremities. Good symmetric motor tone is noted throughout.  Gait and station: Gait is normal.   DIAGNOSTIC DATA (LABS, IMAGING, TESTING) - I reviewed patient records, labs, notes, testing and imaging myself where available.  No flowsheet data found.   Lab Results  Component Value Date   WBC 4.1 04/18/2020   HGB 13.1 04/18/2020   HCT 38.5 (L) 04/18/2020   MCV 90.2 04/18/2020   PLT 190 04/18/2020      Component Value Date/Time   NA 135 04/18/2020 2315   NA 138 08/13/2017 1135   K 3.8 04/18/2020 2315   CL 99 04/18/2020 2315   CO2 27 04/18/2020 2315   GLUCOSE 123 (H) 04/18/2020 2315   BUN 15 04/18/2020 2315   BUN 12 08/13/2017 1135    CREATININE 0.92 04/18/2020 2315   CALCIUM 9.0 04/18/2020 2315   PROT 7.7 06/29/2014 1125   ALBUMIN 4.4 06/29/2014 1125   AST 42 (H) 06/29/2014 1125   ALT 33 06/29/2014 1125   ALKPHOS 66 06/29/2014 1125   BILITOT 0.7 06/29/2014 1125   GFRNONAA >60 04/18/2020 2315   GFRAA 106 08/13/2017 1135   Lab Results  Component Value Date   CHOL 122 02/07/2010   HDL 39.70 02/07/2010   LDLCALC 67 02/07/2010   LDLDIRECT 180.9 11/19/2006   TRIG 79.0 02/07/2010   CHOLHDL 3 02/07/2010   Lab Results  Component Value Date   HGBA1C 6.3 02/07/2010   No results found for: VITAMINB12 Lab Results  Component Value Date   TSH 3.82 02/07/2010       ASSESSMENT AND PLAN 65 y.o. year old male  has a past medical history of Anxiety, Borderline diabetes, Coronary artery disease, Disorder of tendon of right biceps, Family history of adverse reaction to anesthesia, GERD (gastroesophageal reflux disease), Gout, History of esophageal spasm (2008), History of nonmelanoma skin cancer, History of nuclear stress test (02/17/2009), Hyperlipidemia, Hypertension, Hypogonadism male, Hypothyroidism, Nocturia, OA (osteoarthritis), OSA (obstructive sleep apnea), and Right rotator cuff tear. here with   No diagnosis found.   Daequan continues to note benefit with CPAP therapy.  Compliance report reveals optimal compliance.  Unfortunately, concerns of a large leak continue.  We placed orders at his last visit for mask refitting but he has not pursued this option.  He is happy with his full facemask and does not wish to try anything new.  He would like to continue working on tightening his headgear at home.  Fortunately, residual AHI is 2.8.  He was encouraged to continue using CPAP nightly and for greater than 4 hours each night.  He will follow up with me in 1 year, sooner if needed.  He verbalizes understanding and agreement with this plan.  No orders of the defined types were placed in this encounter.    No orders of the  defined types were placed in this encounter.      Debbora Presto, FNP-C 11/07/2020, 8:40 AM [facility] [address], [place],  [address] [phone]

## 2020-11-08 ENCOUNTER — Ambulatory Visit: Payer: Medicare Other | Admitting: Family Medicine

## 2020-11-08 ENCOUNTER — Encounter: Payer: Self-pay | Admitting: Family Medicine

## 2020-11-08 DIAGNOSIS — G4733 Obstructive sleep apnea (adult) (pediatric): Secondary | ICD-10-CM

## 2020-11-15 ENCOUNTER — Telehealth: Payer: Self-pay | Admitting: Family Medicine

## 2020-11-15 NOTE — Telephone Encounter (Signed)
Patient sent a mychart message asking to reschedule his appointment and that he missed his appointment because he is on vacation. I sent a message back letting him know that I gave him Amy's next available appointment.

## 2020-12-01 ENCOUNTER — Ambulatory Visit (INDEPENDENT_AMBULATORY_CARE_PROVIDER_SITE_OTHER): Payer: Medicare Other | Admitting: Internal Medicine

## 2020-12-01 ENCOUNTER — Encounter: Payer: Self-pay | Admitting: Internal Medicine

## 2020-12-01 ENCOUNTER — Other Ambulatory Visit: Payer: Self-pay

## 2020-12-01 VITALS — BP 122/68 | HR 70 | Ht 74.0 in | Wt 259.0 lb

## 2020-12-01 DIAGNOSIS — I10 Essential (primary) hypertension: Secondary | ICD-10-CM | POA: Diagnosis not present

## 2020-12-01 DIAGNOSIS — E782 Mixed hyperlipidemia: Secondary | ICD-10-CM

## 2020-12-01 DIAGNOSIS — G4733 Obstructive sleep apnea (adult) (pediatric): Secondary | ICD-10-CM

## 2020-12-01 DIAGNOSIS — I25118 Atherosclerotic heart disease of native coronary artery with other forms of angina pectoris: Secondary | ICD-10-CM

## 2020-12-01 DIAGNOSIS — I739 Peripheral vascular disease, unspecified: Secondary | ICD-10-CM | POA: Diagnosis not present

## 2020-12-01 MED ORDER — METOPROLOL TARTRATE 100 MG PO TABS: ORAL_TABLET | ORAL | 0 refills | Status: DC

## 2020-12-01 NOTE — Progress Notes (Signed)
Cardiology Office Note:    Date:  12/01/2020   ID:  Howard [name], DOB March 15, 1956, MRN [id number]  PCP:  Crist Infante, MD   [facility] Providers Cardiologist:  Werner Lean, MD     Referring MD: Crist Infante, MD   CC: DOE Consulted for the evaluation of CAD at the behest of Crist Infante, MD  History of Present Illness:    Howard [name] is a 65 y.o. male with a hx of moderate non-obstructive CAD, HTN, OSA on CPAP who presents 12/01/20.  Patient notes that he is feeling worsening SOB.  Has had no chest pain, chest pressure, chest tightness, chest stinging.  Last week felt that he had chest pain with cough but this improved with antibiotics.  Discomfort occurs with mild exertion, worsens with activity and exercise, and improves with rest.  Patient exertion notable for climbing 50 steps three weeks and felt significant DOE.  This is worse than in 2019.  No shortness of breath at rest.  Has extra SOB with travel. No PND or orthopnea.  Notes bendopnea but no weight gain, leg swelling, or abdominal swelling.  No syncope or near syncope . Notes  rare palpitations or funny heart beats; occurs once a day.  No change with caffeine or a beer. Notes   Ambulatory BP at [facility] in the past.   Past Medical History:  Diagnosis Date   Anxiety    Borderline diabetes    Coronary artery disease    cardiologist-  dr Marlou Porch--- coronary CT 09-11-2017 showed non-obstructive mild diffuse cad RCA and ostial LAD and moderate midLAD,     Disorder of tendon of right biceps    dislocation   Family history of adverse reaction to anesthesia    FATHER REACTED TO LIDOCAINE    GERD (gastroesophageal reflux disease)    Gout    per pt stable   History of esophageal spasm 2008   History of nonmelanoma skin cancer    History of nuclear stress test 02/17/2009   normal , no ishemia w/ ef 60%   Hyperlipidemia    Hypertension    Hypogonadism male    followed by pcp   Hypothyroidism     followed by pcp   Nocturia    OA (osteoarthritis)    OSA (obstructive sleep apnea)    per pt has not used for severeal yrs, currently using mouth appliance   Right rotator cuff tear     Past Surgical History:  Procedure Laterality Date   ANAL FISSURE REPAIR  yrs ago   CARPAL TUNNEL RELEASE  2012 /2013   bil   CATARACT EXTRACTION W/ INTRAOCULAR LENS  IMPLANT, BILATERAL  june 9 & 16-- 2020   INJECTION KNEE Left 12/29/2018   Procedure: LEFT KNEE ASPIRATION AND INJECTION;  Surgeon: Tania Ade, MD;  Location: [place];  Service: Orthopedics;  Laterality: Left;   KNEE ARTHROSCOPY  12/12/2011   Procedure: ARTHROSCOPY KNEE;  Surgeon: Gearlean Alf, MD;  Location: [facility];  Service: Orthopedics;  Laterality: Left;  medial menisectomy and chondroplasty   KNEE ARTHROSCOPY Left 10/29/2012   Procedure: LEFT ARTHROSCOPY KNEE WITH CHONDROPLASTY AND DEBRIDMENT OF MENISCUS;  Surgeon: Gearlean Alf, MD;  Location: [facility];  Service: Orthopedics;  Laterality: Left;   KNEE SURGERY Bilateral right 1973 /  left 1985 /   right [place] Left 08-02-2008    dr Beola Cord  @[facility]    SHOULDER ARTHROSCOPY WITH ROTATOR CUFF  REPAIR Right 12/29/2018   Procedure: SHOULDER ARTHROSCOPY WITH ROTATOR CUFF REPAIR and  SUBACROMIAL DECOMPRESSION AND BICEP TENOTOMY;  Surgeon: Tania Ade, MD;  Location: [place];  Service: Orthopedics;  Laterality: Right;   SHOULDER ARTHROSCOPY WITH ROTATOR CUFF REPAIR AND SUBACROMIAL DECOMPRESSION Right 04-09-2009   dr supple  @[facility]    w/ labral debridement and dcr   TOTAL KNEE ARTHROPLASTY Right 07/05/2014   Procedure: RIGHT TOTAL KNEE ARTHROPLASTY;  Surgeon: Gearlean Alf, MD;  Location: [facility];  Service: Orthopedics;  Laterality: Right;    Current Medications: Current Meds  Medication Sig   albuterol (VENTOLIN HFA) 108 (90 Base) MCG/ACT inhaler Inhale into the lungs as needed.   allopurinol (ZYLOPRIM) 300 MG tablet Take 300  mg by mouth every morning.    amLODipine (NORVASC) 10 MG tablet Take 10 mg by mouth daily.   aspirin EC 81 MG tablet Take 81 mg by mouth daily.   buPROPion (WELLBUTRIN XL) 150 MG 24 hr tablet bupropion HCl XL 150 mg 24 hr tablet, extended release   cefdinir (OMNICEF) 300 MG capsule Take 300 mg by mouth 2 (two) times daily.   celecoxib (CELEBREX) 200 MG capsule Take 200 mg by mouth daily.   Coenzyme Q10 (CO Q-10) 200 MG CAPS daily.   colchicine 0.6 MG tablet as needed.   CRESTOR 20 MG tablet Take 10 mg by mouth daily.    doxazosin (CARDURA) 2 MG tablet Take 2 mg by mouth daily.   DULoxetine (CYMBALTA) 60 MG capsule Take 60 mg by mouth every morning.   famotidine (PEPCID) 20 MG tablet Take 20 mg by mouth daily.    fluticasone (FLONASE) 50 MCG/ACT nasal spray Place 2 sprays into both nostrils as needed.   guaiFENesin (MUCINEX) 600 MG 12 hr tablet as needed.   levocetirizine (XYZAL) 5 MG tablet daily.   levothyroxine (SYNTHROID, LEVOTHROID) 175 MCG tablet Take 175 mcg by mouth every morning.    metoprolol tartrate (LOPRESSOR) 100 MG tablet Take 2 hours prior to Cardiac CT   Multiple Vitamin (MULTIVITAMIN) tablet Take 1 tablet by mouth daily.   Omega-3 Fatty Acids (FISH OIL) 1000 MG CAPS Take by mouth.   predniSONE (STERAPRED UNI-PAK 21 TAB) 5 MG (21) TBPK tablet Take by mouth as directed.   sildenafil (VIAGRA) 100 MG tablet Take 50 mg by mouth once as needed.    testosterone (ANDROGEL) 50 MG/5GM (1%) GEL Place 5 g onto the skin 2 (two) times daily.   valsartan-hydrochlorothiazide (DIOVAN-HCT) 320-25 MG tablet Take 1 tablet by mouth daily.   [DISCONTINUED] amoxicillin-clavulanate (AUGMENTIN) 875-125 MG tablet Take 1 tablet by mouth every 12 (twelve) hours.   [DISCONTINUED] benzonatate (TESSALON) 100 MG capsule Take 1 capsule (100 mg total) by mouth every 8 (eight) hours.   [DISCONTINUED] fluticasone (FLONASE) 50 MCG/ACT nasal spray Place 1 spray into both nostrils daily.     Allergies:   Sulfa  antibiotics   Social History   Socioeconomic History   Marital status: Married    Spouse name: Not on file   Number of children: Not on file   Years of education: Not on file   Highest education level: Not on file  Occupational History   Not on file  Tobacco Use   Smoking status: Former    Years: 30.00    Pack years: 0.00    Types: Cigarettes    Quit date: 12/05/2000    Years since quitting: 20.0   Smokeless tobacco: Never  Vaping Use   Vaping  Use: Never used  Substance and Sexual Activity   Alcohol use: Yes    Comment: occassional   Drug use: Never   Sexual activity: Not on file  Other Topics Concern   Not on file  Social History Narrative   Not on file   Social Determinants of Health   Financial Resource Strain: Not on file  Food Insecurity: Not on file  Transportation Needs: Not on file  Physical Activity: Not on file  Stress: Not on file  Social Connections: Not on file    Social: has two sons on the [place], Former Audiological scientist  Family History: The patient's family history includes Heart disease in his father; Hypertension in his father.  ROS:   Please see the history of present illness.     All other systems reviewed and are negative.  EKGs/Labs/Other Studies Reviewed:    The following studies were reviewed today:  EKG:   04/19/20: SR 68  Transthoracic Echocardiogram: Date:08/20/2017 Results: Study Conclusions   - Left ventricle: The cavity size was normal. Wall thickness was    increased in a pattern of mild LVH. Systolic function was normal.    The estimated ejection fraction was in the range of 60% to 65%.    Wall motion was normal; there were no regional wall motion    abnormalities. Left ventricular diastolic function parameters    were normal.  - Mitral valve: There was mild regurgitation.  - Atrial septum: No defect or patent foramen ovale was identified.   Cardiac CT  Date:09/11/2017 Results: IMPRESSION: 1. Coronary calcium score of  112. This was 73 percentile for age and sex matched control.   2. Normal coronary origin with right dominance.   3. Mild diffuse CAD in the RCA and ostial LAD, moderate CAD in the mid LAD. Additional analysis with CT FFR will be submitted. IMPRESSION: 1.  CT FFR analysis didn't show any significant stenosis.  ABI and Duplex Date 08/20/2017 Results:  Final Interpretation:  Right: Resting right ankle-brachial index is within normal range. No  evidence of significant right lower extremity arterial disease. The right  toe-brachial index is normal.  Left: Resting left ankle-brachial index is within normal range. No  evidence of significant left lower extremity arterial disease. The left  toe-brachial index is normal.   Recent Labs: 04/18/2020: BUN 15; Creatinine, Ser 0.92; Hemoglobin 13.1; Platelets 190; Potassium 3.8; Sodium 135  Recent Lipid Panel    Component Value Date/Time   CHOL 122 02/07/2010 0914   TRIG 79.0 02/07/2010 0914   HDL 39.70 02/07/2010 0914   CHOLHDL 3 02/07/2010 0914   VLDL 15.8 02/07/2010 0914   LDLCALC 67 02/07/2010 0914   LDLDIRECT 180.9 11/19/2006 0933     Risk Assessment/Calculations:     N/A   Physical Exam:    VS:  BP 122/68   Pulse 70   Ht 6\' 2"  (1.88 m)   Wt 117.5 kg   SpO2 96%   BMI 33.25 kg/m     Wt Readings from Last 3 Encounters:  12/01/20 117.5 kg  04/18/20 113.4 kg  04/18/20 113.4 kg     GEN:  Well nourished, well developed in no acute distress HEENT: Normal NECK: No JVD; No carotid bruits LYMPHATICS: No lymphadenopathy CARDIAC: RRR, no murmurs, rubs, gallops RESPIRATORY:  Clear to auscultation without rales, wheezing or rhonchi  ABDOMEN: Soft, non-tender, non-distended MUSCULOSKELETAL:  No edema; No deformity  SKIN: Warm and dry NEUROLOGIC:  Alert and oriented x 3 PSYCHIATRIC:  Normal  affect   ASSESSMENT:    1. Claudication ([place])   2. OBSTRUCTIVE SLEEP APNEA   3. Coronary artery disease of native artery of native  heart with stable angina pectoris ([place])   4. Essential hypertension   5. Mixed hyperlipidemia    PLAN:    DOE Non-Obstructive CAD HTN OSA on CPAP Bilateral Leg Claudication - continue ASA 81 mg Po Daily - BP controlled on norvasc 10 mg, doxazosin 2 mg PO Daily - LDL at goal (< 70) with rosuvastatin 20 mg PO Daily and Omega 3 Fatty Acids - will get CCTA- 100 metoprolol - will get echocardiogram - can do Zio if worsening palpitations - low threshold for CPET - Risks and benefits of CPET have been discussed with the patient. The patient understands these risks and is willing to proceed. - Has plans to go to July 6th and 7th to [place] - at next visit depending on sx, will consider repeat ABI and Duplex  3-4 months follow up unless new symptoms or abnormal test results warranting change in plan   Would be reasonable for  APP Follow up    Medication Adjustments/Labs and Tests Ordered: Current medicines are reviewed at length with the patient today.  Concerns regarding medicines are outlined above.  Orders Placed This Encounter  Procedures   CT CORONARY MORPH W/CTA COR W/SCORE W/CA W/CM &/OR WO/CM   ECHOCARDIOGRAM COMPLETE    Meds ordered this encounter  Medications   metoprolol tartrate (LOPRESSOR) 100 MG tablet    Sig: Take 2 hours prior to Cardiac CT    Dispense:  1 tablet    Refill:  0     Patient Instructions  Medication Instructions:  Your physician recommends that you continue on your current medications as directed. Please refer to the Current Medication list given to you today.  *If you need a refill on your cardiac medications before your next appointment, please call your pharmacy*   Lab Work: NONE If you have labs (blood work) drawn today and your tests are completely normal, you will receive your results only by: [place] (if you have MyChart) OR A paper copy in the mail If you have any lab test that is abnormal or we need to change your  treatment, we will call you to review the results.   Testing/Procedures: Your physician has requested that you have an echocardiogram. Echocardiography is a painless test that uses sound waves to create images of your heart. It provides your doctor with information about the size and shape of your heart and how well your heart's chambers and valves are working. This procedure takes approximately one hour. There are no restrictions for this procedure.   Your physician has requested that you have cardiac CT. Cardiac computed tomography (CT) is a painless test that uses an x-ray machine to take clear, detailed pictures of your heart. Please follow instruction sheet as given.     Follow-Up: At [facility], you and your health needs are our priority.  As part of our continuing mission to provide you with exceptional heart care, we have created designated Provider Care Teams.  These Care Teams include your primary Cardiologist (physician) and Advanced Practice Providers (APPs -  Physician Assistants and Nurse Practitioners) who all work together to provide you with the care you need, when you need it.  We recommend signing up for the patient portal called "MyChart".  Sign up information is provided on this After Visit Summary.  MyChart is used to connect  with patients for Virtual Visits (Telemedicine).  Patients are able to view lab/test results, encounter notes, upcoming appointments, etc.  Non-urgent messages can be sent to your provider as well.   To learn more about what you can do with MyChart, go to [email or web address].    Your next appointment:   3-4 month(s)  The format for your next appointment:   In Person  Provider:   You may see Werner Lean, MD or one of the following Advanced Practice Providers on your designated Care Team:   Melina Copa, PA-C Ermalinda Barrios, PA-C   Other Instructions  Your cardiac CT will be scheduled at one of the below locations:   [facility] [address] [place], [place] [address] [phone]   At [facility], please arrive at the [facility] main entrance (entrance A) of [facility] 30 minutes prior to test start time. Proceed to the [facility] Radiology Department (first floor) to check-in and test prep.   Please follow these instructions carefully (unless otherwise directed):  Hold all erectile dysfunction medications (VIAGRA) at least 3 days (72 hrs) prior to test.  On the Night Before the Test: Be sure to Drink plenty of water. Do not consume any caffeinated/decaffeinated beverages or chocolate 12 hours prior to your test. Do not take any antihistamines 12 hours prior to your test.   On the Day of the Test: Drink plenty of water until 1 hour prior to the test. Do not eat any food 4 hours prior to the test. You may take your regular medications prior to the test.  Take metoprolol (Lopressor) 100 mg by mouth two hours prior to test.        After the Test: Drink plenty of water. After receiving IV contrast, you may experience a mild flushed feeling. This is normal. On occasion, you may experience a mild rash up to 24 hours after the test. This is not dangerous. If this occurs, you can take Benadryl 25 mg and increase your fluid intake. If you experience trouble breathing, this can be serious. If it is severe call 911 IMMEDIATELY. If it is mild, please call our office. If you take any of these medications: Glipizide/Metformin, Avandament, Glucavance, please do not take 48 hours after completing test unless otherwise instructed.   Once we have confirmed authorization from your insurance company, we will call you to set up a date and time for your test. Based on how quickly your insurance processes prior authorizations requests, please allow up to 4 weeks to be contacted for scheduling your Cardiac CT appointment. Be advised that routine Cardiac CT appointments could be scheduled as  many as 8 weeks after your provider has ordered it.  For non-scheduling related questions, please contact the cardiac imaging nurse navigator should you have any questions/concerns: Marchia Bond, Cardiac Imaging Nurse Navigator Gordy Clement, Cardiac Imaging Nurse Navigator  Heart and Vascular Services Direct Office Dial: [phone]   For scheduling needs, including cancellations and rescheduling, please call [place], [phone].     Signed, Werner Lean, MD  12/01/2020 3:43 PM    [place]

## 2020-12-01 NOTE — Patient Instructions (Signed)
Medication Instructions:  Your physician recommends that you continue on your current medications as directed. Please refer to the Current Medication list given to you today.  *If you need a refill on your cardiac medications before your next appointment, please call your pharmacy*   Lab Work: NONE If you have labs (blood work) drawn today and your tests are completely normal, you will receive your results only by: [place] (if you have MyChart) OR A paper copy in the mail If you have any lab test that is abnormal or we need to change your treatment, we will call you to review the results.   Testing/Procedures: Your physician has requested that you have an echocardiogram. Echocardiography is a painless test that uses sound waves to create images of your heart. It provides your doctor with information about the size and shape of your heart and how well your heart's chambers and valves are working. This procedure takes approximately one hour. There are no restrictions for this procedure.   Your physician has requested that you have cardiac CT. Cardiac computed tomography (CT) is a painless test that uses an x-ray machine to take clear, detailed pictures of your heart. Please follow instruction sheet as given.     Follow-Up: At [facility], you and your health needs are our priority.  As part of our continuing mission to provide you with exceptional heart care, we have created designated Provider Care Teams.  These Care Teams include your primary Cardiologist (physician) and Advanced Practice Providers (APPs -  Physician Assistants and Nurse Practitioners) who all work together to provide you with the care you need, when you need it.  We recommend signing up for the patient portal called "MyChart".  Sign up information is provided on this After Visit Summary.  MyChart is used to connect with patients for Virtual Visits (Telemedicine).  Patients are able to view lab/test results,  encounter notes, upcoming appointments, etc.  Non-urgent messages can be sent to your provider as well.   To learn more about what you can do with MyChart, go to [email or web address].    Your next appointment:   3-4 month(s)  The format for your next appointment:   In Person  Provider:   You may see Werner Lean, MD or one of the following Advanced Practice Providers on your designated Care Team:   Melina Copa, PA-C Ermalinda Barrios, PA-C   Other Instructions  Your cardiac CT will be scheduled at one of the below locations:   [facility] [address] [place], [place] [address] [phone]   At [facility], please arrive at the [facility] main entrance (entrance A) of [facility] 30 minutes prior to test start time. Proceed to the [facility] Radiology Department (first floor) to check-in and test prep.   Please follow these instructions carefully (unless otherwise directed):  Hold all erectile dysfunction medications (VIAGRA) at least 3 days (72 hrs) prior to test.  On the Night Before the Test: Be sure to Drink plenty of water. Do not consume any caffeinated/decaffeinated beverages or chocolate 12 hours prior to your test. Do not take any antihistamines 12 hours prior to your test.   On the Day of the Test: Drink plenty of water until 1 hour prior to the test. Do not eat any food 4 hours prior to the test. You may take your regular medications prior to the test.  Take metoprolol (Lopressor) 100 mg by mouth two hours prior to test.  After the Test: Drink plenty of water. After receiving IV contrast, you may experience a mild flushed feeling. This is normal. On occasion, you may experience a mild rash up to 24 hours after the test. This is not dangerous. If this occurs, you can take Benadryl 25 mg and increase your fluid intake. If you experience trouble breathing, this can be serious. If it is severe call 911  IMMEDIATELY. If it is mild, please call our office. If you take any of these medications: Glipizide/Metformin, Avandament, Glucavance, please do not take 48 hours after completing test unless otherwise instructed.   Once we have confirmed authorization from your insurance company, we will call you to set up a date and time for your test. Based on how quickly your insurance processes prior authorizations requests, please allow up to 4 weeks to be contacted for scheduling your Cardiac CT appointment. Be advised that routine Cardiac CT appointments could be scheduled as many as 8 weeks after your provider has ordered it.  For non-scheduling related questions, please contact the cardiac imaging nurse navigator should you have any questions/concerns: Marchia Bond, Cardiac Imaging Nurse Navigator Gordy Clement, Cardiac Imaging Nurse Navigator [place] [facility] Direct Office Dial: [phone]   For scheduling needs, including cancellations and rescheduling, please call [place], [phone].

## 2020-12-02 ENCOUNTER — Other Ambulatory Visit: Payer: Self-pay

## 2020-12-02 DIAGNOSIS — I10 Essential (primary) hypertension: Secondary | ICD-10-CM

## 2020-12-02 NOTE — Progress Notes (Signed)
Placed orders for BMET. Lab needed prior to CT scheduled for 12/05/20.

## 2020-12-05 ENCOUNTER — Other Ambulatory Visit: Payer: Medicare Other

## 2020-12-05 ENCOUNTER — Other Ambulatory Visit: Payer: Self-pay

## 2020-12-05 DIAGNOSIS — I10 Essential (primary) hypertension: Secondary | ICD-10-CM

## 2020-12-05 LAB — BASIC METABOLIC PANEL
BUN/Creatinine Ratio: 21 (ref 10–24)
BUN: 17 mg/dL (ref 8–27)
CO2: 23 mmol/L (ref 20–29)
Calcium: 9.4 mg/dL (ref 8.6–10.2)
Chloride: 98 mmol/L (ref 96–106)
Creatinine, Ser: 0.82 mg/dL (ref 0.76–1.27)
Glucose: 126 mg/dL — ABNORMAL HIGH (ref 65–99)
Potassium: 3.8 mmol/L (ref 3.5–5.2)
Sodium: 135 mmol/L (ref 134–144)
eGFR: 96 mL/min/{1.73_m2} (ref >59)

## 2020-12-07 ENCOUNTER — Telehealth (HOSPITAL_COMMUNITY): Payer: Self-pay | Admitting: *Deleted

## 2020-12-07 NOTE — Telephone Encounter (Signed)
Attempted to call patient regarding upcoming cardiac CT appointment. °Left message on voicemail with name and callback number ° °Helix Lafontaine RN Navigator Cardiac Imaging °[place] [facility] °[phone] Office °[phone] Cell ° °

## 2020-12-08 ENCOUNTER — Ambulatory Visit (HOSPITAL_COMMUNITY)
Admission: RE | Admit: 2020-12-08 | Discharge: 2020-12-08 | Disposition: A | Discharge status: Home / Self Care | Payer: Medicare Other | Source: Ambulatory Visit | Attending: Internal Medicine | Admitting: Internal Medicine

## 2020-12-08 ENCOUNTER — Other Ambulatory Visit: Payer: Self-pay

## 2020-12-08 DIAGNOSIS — I25118 Atherosclerotic heart disease of native coronary artery with other forms of angina pectoris: Secondary | ICD-10-CM

## 2020-12-08 MED ORDER — IOHEXOL 350 MG/ML SOLN
80.0000 mL | Freq: Once | INTRAVENOUS | Status: AC
  Administered 2020-12-08: 80 mL via INTRAVENOUS

## 2020-12-08 MED ORDER — NITROGLYCERIN 0.4 MG SL SUBL
SUBLINGUAL_TABLET | SUBLINGUAL | Status: AC
  Filled 2020-12-08: qty 2

## 2020-12-08 MED ORDER — NITROGLYCERIN 0.4 MG SL SUBL
0.8000 mg | SUBLINGUAL_TABLET | Freq: Once | SUBLINGUAL | Status: AC
  Administered 2020-12-08: 0.8 mg via SUBLINGUAL

## 2020-12-15 ENCOUNTER — Other Ambulatory Visit: Payer: Self-pay

## 2020-12-15 ENCOUNTER — Ambulatory Visit (HOSPITAL_COMMUNITY)
Admission: RE | Admit: 2020-12-15 | Discharge: 2020-12-15 | Disposition: A | Discharge status: Home / Self Care | Payer: Medicare Other | Source: Ambulatory Visit | Attending: Internal Medicine | Admitting: Internal Medicine

## 2020-12-15 DIAGNOSIS — K219 Gastro-esophageal reflux disease without esophagitis: Secondary | ICD-10-CM | POA: Diagnosis not present

## 2020-12-15 DIAGNOSIS — E785 Hyperlipidemia, unspecified: Secondary | ICD-10-CM | POA: Insufficient documentation

## 2020-12-15 DIAGNOSIS — E119 Type 2 diabetes mellitus without complications: Secondary | ICD-10-CM | POA: Insufficient documentation

## 2020-12-15 DIAGNOSIS — Z87891 Personal history of nicotine dependence: Secondary | ICD-10-CM | POA: Diagnosis not present

## 2020-12-15 DIAGNOSIS — G4730 Sleep apnea, unspecified: Secondary | ICD-10-CM | POA: Insufficient documentation

## 2020-12-15 DIAGNOSIS — I10 Essential (primary) hypertension: Secondary | ICD-10-CM | POA: Insufficient documentation

## 2020-12-15 DIAGNOSIS — I25118 Atherosclerotic heart disease of native coronary artery with other forms of angina pectoris: Secondary | ICD-10-CM | POA: Diagnosis not present

## 2020-12-15 LAB — ECHOCARDIOGRAM COMPLETE
Area-P 1/2: 3.26 cm2
Calc EF: 61.4 %
S' Lateral: 3.6 cm
Single Plane A2C EF: 55.8 %
Single Plane A4C EF: 67.7 %

## 2020-12-15 NOTE — Progress Notes (Signed)
  Echocardiogram 2D Echocardiogram has been performed.  Michiel Cowboy 12/15/2020, 11:53 AM

## 2021-01-26 ENCOUNTER — Ambulatory Visit: Payer: Medicare Other | Admitting: Family Medicine

## 2021-01-29 NOTE — Progress Notes (Signed)
Cardiology Office Note:    Date:  01/30/2021   ID:  Howard [name], DOB Apr 27, 1956, MRN [id number]  PCP:  Crist Infante, MD   [facility] Providers Cardiologist:  Werner Lean, MD     Referring MD: Crist Infante, MD   CC: DOE f/u  History of Present Illness:    Howard [name] is a 65 y.o. male with a hx of moderate non-obstructive CAD, HTN, OSA on CPAP who presents 12/01/20.  In interim of this visit, patient has minimal non obstructive CAD.  Normal EF.  Seen 01/30/21. In interim of this visit, patient had good trip in [place].  Has similar CCTA and echo.  Patient notes that he is doing good.  Still notes some shortness of breath with climbing steps and running .  There are no interval hospital/ED visit.  Has been biking a bit more but has barriers related to his knee.  No chest pain or pressure.  No SOB at rest.  No leg claudication with exercise.  No weight gain or leg swelling.  No palpitations or syncope.   Past Medical History:  Diagnosis Date   Anxiety    Borderline diabetes    Coronary artery disease    cardiologist-  dr Marlou Porch--- coronary CT 09-11-2017 showed non-obstructive mild diffuse cad RCA and ostial LAD and moderate midLAD,     Disorder of tendon of right biceps    dislocation   Family history of adverse reaction to anesthesia    FATHER REACTED TO LIDOCAINE    GERD (gastroesophageal reflux disease)    Gout    per pt stable   History of esophageal spasm 2008   History of nonmelanoma skin cancer    History of nuclear stress test 02/17/2009   normal , no ishemia w/ ef 60%   Hyperlipidemia    Hypertension    Hypogonadism male    followed by pcp   Hypothyroidism    followed by pcp   Nocturia    OA (osteoarthritis)    OSA (obstructive sleep apnea)    per pt has not used for severeal yrs, currently using mouth appliance   Right rotator cuff tear     Past Surgical History:  Procedure Laterality Date   ANAL FISSURE REPAIR  yrs ago    CARPAL TUNNEL RELEASE  2012 /2013   bil   CATARACT EXTRACTION W/ INTRAOCULAR LENS  IMPLANT, BILATERAL  june 9 & 16-- 2020   INJECTION KNEE Left 12/29/2018   Procedure: LEFT KNEE ASPIRATION AND INJECTION;  Surgeon: Tania Ade, MD;  Location: [place];  Service: Orthopedics;  Laterality: Left;   KNEE ARTHROSCOPY  12/12/2011   Procedure: ARTHROSCOPY KNEE;  Surgeon: Gearlean Alf, MD;  Location: [facility];  Service: Orthopedics;  Laterality: Left;  medial menisectomy and chondroplasty   KNEE ARTHROSCOPY Left 10/29/2012   Procedure: LEFT ARTHROSCOPY KNEE WITH CHONDROPLASTY AND DEBRIDMENT OF MENISCUS;  Surgeon: Gearlean Alf, MD;  Location: [facility];  Service: Orthopedics;  Laterality: Left;   KNEE SURGERY Bilateral right 1973 /  left 1985 /   right 1993   PLANTAR FASCIA SURGERY Left 08-02-2008    dr Beola Cord  '@[facility]'$    SHOULDER ARTHROSCOPY WITH ROTATOR CUFF REPAIR Right 12/29/2018   Procedure: SHOULDER ARTHROSCOPY WITH ROTATOR CUFF REPAIR and  SUBACROMIAL DECOMPRESSION AND BICEP TENOTOMY;  Surgeon: Tania Ade, MD;  Location: [place];  Service: Orthopedics;  Laterality: Right;   SHOULDER ARTHROSCOPY WITH ROTATOR CUFF REPAIR AND SUBACROMIAL  DECOMPRESSION Right 04-09-2009   dr supple  '@[facility]'$    w/ labral debridement and dcr   TOTAL KNEE ARTHROPLASTY Right 07/05/2014   Procedure: RIGHT TOTAL KNEE ARTHROPLASTY;  Surgeon: Gearlean Alf, MD;  Location: [facility];  Service: Orthopedics;  Laterality: Right;    Current Medications: Current Meds  Medication Sig   albuterol (VENTOLIN HFA) 108 (90 Base) MCG/ACT inhaler Inhale into the lungs as needed.   allopurinol (ZYLOPRIM) 300 MG tablet Take 300 mg by mouth every morning.    amLODipine (NORVASC) 10 MG tablet Take 10 mg by mouth daily.   aspirin EC 81 MG tablet Take 81 mg by mouth daily.   buPROPion (WELLBUTRIN XL) 150 MG 24 hr tablet bupropion HCl XL 150 mg 24 hr tablet, extended release   celecoxib (CELEBREX) 200 MG  capsule Take 200 mg by mouth daily.   Coenzyme Q10 (CO Q-10) 200 MG CAPS daily.   colchicine 0.6 MG tablet as needed.   CRESTOR 20 MG tablet Take 10 mg by mouth daily.    doxazosin (CARDURA) 2 MG tablet Take 2 mg by mouth daily.   DULoxetine (CYMBALTA) 60 MG capsule Take 60 mg by mouth every morning.   famotidine (PEPCID) 20 MG tablet Take 20 mg by mouth daily.    fluticasone (FLONASE) 50 MCG/ACT nasal spray Place 2 sprays into both nostrils as needed.   guaiFENesin (MUCINEX) 600 MG 12 hr tablet as needed.   levocetirizine (XYZAL) 5 MG tablet daily.   levothyroxine (SYNTHROID, LEVOTHROID) 175 MCG tablet Take 175 mcg by mouth every morning.    Multiple Vitamin (MULTIVITAMIN) tablet Take 1 tablet by mouth daily.   Omega-3 Fatty Acids (FISH OIL) 1000 MG CAPS Take by mouth.   sildenafil (VIAGRA) 100 MG tablet Take 50 mg by mouth once as needed.    testosterone (ANDROGEL) 50 MG/5GM (1%) GEL Place 5 g onto the skin 2 (two) times daily.   valsartan-hydrochlorothiazide (DIOVAN-HCT) 320-25 MG tablet Take 1 tablet by mouth daily.     Allergies:   Sulfa antibiotics   Social History   Socioeconomic History   Marital status: Married    Spouse name: Not on file   Number of children: Not on file   Years of education: Not on file   Highest education level: Not on file  Occupational History   Not on file  Tobacco Use   Smoking status: Former    Years: 30.00    Types: Cigarettes    Quit date: 12/05/2000    Years since quitting: 20.1   Smokeless tobacco: Never  Vaping Use   Vaping Use: Never used  Substance and Sexual Activity   Alcohol use: Yes    Comment: occassional   Drug use: Never   Sexual activity: Not on file  Other Topics Concern   Not on file  Social History Narrative   Not on file   Social Determinants of Health   Financial Resource Strain: Not on file  Food Insecurity: Not on file  Transportation Needs: Not on file  Physical Activity: Not on file  Stress: Not on file   Social Connections: Not on file    Social: has two sons on the [place] ([employer] professors at [employer]) and they often go to [place], Former Audiological scientist  Family History: The patient's family history includes Heart disease in his father; Hypertension in his father.  ROS:   Please see the history of present illness.     All other systems reviewed and are negative.  EKGs/Labs/Other Studies Reviewed:    The following studies were reviewed today:  EKG:   04/19/20: SR 68  Transthoracic Echocardiogram: Date:08/20/2017 Results: Study Conclusions   - Left ventricle: The cavity size was normal. Wall thickness was    increased in a pattern of mild LVH. Systolic function was normal.    The estimated ejection fraction was in the range of 60% to 65%.    Wall motion was normal; there were no regional wall motion    abnormalities. Left ventricular diastolic function parameters    were normal.  - Mitral valve: There was mild regurgitation.  - Atrial septum: No defect or patent foramen ovale was identified.   Date: 12/15/2020 Results:  1. Left ventricular ejection fraction, by estimation, is 60 to 65%. The  left ventricle has normal function. The left ventricle has no regional  wall motion abnormalities. Left ventricular diastolic parameters were  normal.   2. Right ventricular systolic function is normal. The right ventricular  size is normal. Tricuspid regurgitation signal is inadequate for assessing  PA pressure.   3. The mitral valve is normal in structure. Trivial mitral valve  regurgitation. No evidence of mitral stenosis.   4. The aortic valve is tricuspid. There is mild thickening of the aortic  valve. Aortic valve regurgitation is not visualized. Mild aortic valve  sclerosis is present, with no evidence of aortic valve stenosis.   5. Aortic dilatation noted. There is mild dilatation of the ascending  aorta, measuring 37 mm. (WNL for age and BSA)  6. The inferior vena cava is normal in  size with greater than 50%  respiratory variability, suggesting right atrial pressure of 3 mmHg.   Cardiac CT  Date:09/11/2017 Results: IMPRESSION: 1. Coronary calcium score of 112. This was 65 percentile for age and sex matched control.   2. Normal coronary origin with right dominance.   3. Mild diffuse CAD in the RCA and ostial LAD, moderate CAD in the mid LAD. Additional analysis with CT FFR will be submitted. IMPRESSION: 1.  CT FFR analysis didn't show any significant stenosis.  Date: 12/15/20 Results: IMPRESSION: 1. Coronary calcium score of 317. This was 70th percentile for age and sex matched control.   2. Normal coronary origin with right dominance.   3. Nonobstructive CAD. Most significant lesion is calcified plaque in mid LAD that causes mild (25-49%) stenosis    ABI and Duplex Date 08/20/2017 Results:  Final Interpretation:  Right: Resting right ankle-brachial index is within normal range. No  evidence of significant right lower extremity arterial disease. The right  toe-brachial index is normal.  Left: Resting left ankle-brachial index is within normal range. No  evidence of significant left lower extremity arterial disease. The left  toe-brachial index is normal.   Recent Labs: 04/18/2020: Hemoglobin 13.1; Platelets 190 12/05/2020: BUN 17; Creatinine, Ser 0.82; Potassium 3.8; Sodium 135  Recent Lipid Panel    Component Value Date/Time   CHOL 122 02/07/2010 0914   TRIG 79.0 02/07/2010 0914   HDL 39.70 02/07/2010 0914   CHOLHDL 3 02/07/2010 0914   VLDL 15.8 02/07/2010 0914   LDLCALC 67 02/07/2010 0914   LDLDIRECT 180.9 11/19/2006 0933     Risk Assessment/Calculations:     N/A   Physical Exam:    VS:  BP 126/70   Pulse 63   Ht '6\' 2"'$  (1.88 m)   Wt 262 lb (118.8 kg)   SpO2 96%   BMI 33.64 kg/m     Wt Readings from Last  3 Encounters:  01/30/21 262 lb (118.8 kg)  12/01/20 259 lb (117.5 kg)  04/18/20 250 lb (113.4 kg)     GEN:  Well  nourished, well developed in no acute distress HEENT: Normal NECK: No JVD; No carotid bruits LYMPHATICS: No lymphadenopathy CARDIAC: RRR,  no murmurs  no rubs, gallops RESPIRATORY:  Clear to auscultation without rales, wheezing or rhonchi  ABDOMEN: Soft, non-tender, non-distended MUSCULOSKELETAL:  No edema; No deformity  SKIN: Warm and dry NEUROLOGIC:  Alert and oriented x 3 PSYCHIATRIC:  Normal affect   ASSESSMENT:    1. DOE (dyspnea on exertion)   2. Coronary artery disease of native artery of native heart with stable angina pectoris ([place])   3. Essential hypertension   4. OSA (obstructive sleep apnea)     PLAN:    Non-Obstructive CAD and Aortic Atherosclerosis HTN DOE OSA on CPAP - discussed questions of deconditioning vs  - - Risks and benefits of CPET have been discussed with the patient. The patient understands these risks and is willing to proceed. - continue ASA 81 mg Po Daily - BP controlled on norvasc 10 mg, doxazosin 2 mg PO Daily; valsartan and HCTZ - LDL at goal (< 70) with rosuvastatin 20 mg PO Daily and Omega 3 Fatty Acids  - palpitations have resolved - claudication type symptoms have resolved  Winter follow up unless new symptoms or abnormal test results warranting change in plan  Would be reasonable for  APP Follow up     Medication Adjustments/Labs and Tests Ordered: Current medicines are reviewed at length with the patient today.  Concerns regarding medicines are outlined above.  Orders Placed This Encounter  Procedures   Cardiopulmonary exercise test     No orders of the defined types were placed in this encounter.    Patient Instructions  Medication Instructions:  Your physician recommends that you continue on your current medications as directed. Please refer to the Current Medication list given to you today.  *If you need a refill on your cardiac medications before your next appointment, please call your pharmacy*   Lab  Work: NONE If you have labs (blood work) drawn today and your tests are completely normal, you will receive your results only by: [place] (if you have MyChart) OR A paper copy in the mail If you have any lab test that is abnormal or we need to change your treatment, we will call you to review the results.   Testing/Procedures: Your physician has requested that you have an Cardiopulmonary Exercise Test.    Follow-Up: At [facility], you and your health needs are our priority.  As part of our continuing mission to provide you with exceptional heart care, we have created designated Provider Care Teams.  These Care Teams include your primary Cardiologist (physician) and Advanced Practice Providers (APPs -  Physician Assistants and Nurse Practitioners) who all work together to provide you with the care you need, when you need it.    Your next appointment:   4 month(s)  The format for your next appointment:   In Person  Provider:   You may see Werner Lean, MD or one of the following Advanced Practice Providers on your designated Care Team:   Melina Copa, PA-C Ermalinda Barrios, PA-C        Signed, Werner Lean, MD  01/30/2021 9:30 AM    [place]

## 2021-01-30 ENCOUNTER — Encounter: Payer: Self-pay | Admitting: Internal Medicine

## 2021-01-30 ENCOUNTER — Other Ambulatory Visit: Payer: Self-pay

## 2021-01-30 ENCOUNTER — Ambulatory Visit (INDEPENDENT_AMBULATORY_CARE_PROVIDER_SITE_OTHER): Payer: Medicare Other | Admitting: Internal Medicine

## 2021-01-30 VITALS — BP 126/70 | HR 63 | Ht 74.0 in | Wt 262.0 lb

## 2021-01-30 DIAGNOSIS — G4733 Obstructive sleep apnea (adult) (pediatric): Secondary | ICD-10-CM

## 2021-01-30 DIAGNOSIS — R0600 Dyspnea, unspecified: Secondary | ICD-10-CM

## 2021-01-30 DIAGNOSIS — R0609 Other forms of dyspnea: Secondary | ICD-10-CM | POA: Insufficient documentation

## 2021-01-30 DIAGNOSIS — I10 Essential (primary) hypertension: Secondary | ICD-10-CM | POA: Diagnosis not present

## 2021-01-30 DIAGNOSIS — I25118 Atherosclerotic heart disease of native coronary artery with other forms of angina pectoris: Secondary | ICD-10-CM

## 2021-01-30 NOTE — Patient Instructions (Signed)
Medication Instructions:  Your physician recommends that you continue on your current medications as directed. Please refer to the Current Medication list given to you today.  *If you need a refill on your cardiac medications before your next appointment, please call your pharmacy*   Lab Work: NONE If you have labs (blood work) drawn today and your tests are completely normal, you will receive your results only by: [place] (if you have MyChart) OR A paper copy in the mail If you have any lab test that is abnormal or we need to change your treatment, we will call you to review the results.   Testing/Procedures: Your physician has requested that you have an Cardiopulmonary Exercise Test.    Follow-Up: At [facility], you and your health needs are our priority.  As part of our continuing mission to provide you with exceptional heart care, we have created designated Provider Care Teams.  These Care Teams include your primary Cardiologist (physician) and Advanced Practice Providers (APPs -  Physician Assistants and Nurse Practitioners) who all work together to provide you with the care you need, when you need it.    Your next appointment:   4 month(s)  The format for your next appointment:   In Person  Provider:   You may see Werner Lean, MD or one of the following Advanced Practice Providers on your designated Care Team:   Melina Copa, PA-C Ermalinda Barrios, PA-C

## 2021-02-14 ENCOUNTER — Ambulatory Visit: Payer: Medicare Other | Admitting: Cardiology

## 2021-02-16 ENCOUNTER — Ambulatory Visit: Payer: Medicare Other | Admitting: Internal Medicine

## 2021-02-16 ENCOUNTER — Ambulatory Visit (HOSPITAL_COMMUNITY): Payer: Medicare Other | Attending: Internal Medicine

## 2021-02-16 ENCOUNTER — Other Ambulatory Visit: Payer: Self-pay

## 2021-02-16 DIAGNOSIS — R0609 Other forms of dyspnea: Secondary | ICD-10-CM

## 2021-02-16 DIAGNOSIS — R0600 Dyspnea, unspecified: Secondary | ICD-10-CM | POA: Insufficient documentation

## 2021-04-19 ENCOUNTER — Ambulatory Visit (INDEPENDENT_AMBULATORY_CARE_PROVIDER_SITE_OTHER): Payer: Medicare Other | Admitting: Family Medicine

## 2021-04-19 ENCOUNTER — Encounter: Payer: Self-pay | Admitting: Family Medicine

## 2021-04-19 VITALS — BP 125/71 | HR 68 | Ht 74.0 in | Wt 262.0 lb

## 2021-04-19 DIAGNOSIS — G4733 Obstructive sleep apnea (adult) (pediatric): Secondary | ICD-10-CM | POA: Diagnosis not present

## 2021-04-19 DIAGNOSIS — Z9989 Dependence on other enabling machines and devices: Secondary | ICD-10-CM

## 2021-04-19 NOTE — Patient Instructions (Addendum)
Please continue using your CPAP regularly. While your insurance requires that you use CPAP at least 4 hours each night on 70% of the nights, I recommend, that you not skip any nights and use it throughout the night if you can. Getting used to CPAP and staying with the treatment long term does take time and patience and discipline. Untreated obstructive sleep apnea when it is moderate to severe can have an adverse impact on cardiovascular health and raise her risk for heart disease, arrhythmias, hypertension, congestive heart failure, stroke and diabetes. Untreated obstructive sleep apnea causes sleep disruption, nonrestorative sleep, and sleep deprivation. This can have an impact on your day to day functioning and cause daytime sleepiness and impairment of cognitive function, memory loss, mood disturbance, and problems focussing. Using CPAP regularly can improve these symptoms.  Monitor for leak at home. Let me know if worsens or if you would like to try a different mask. Ask DME about travel machine options if you change your mind.   Follow up in 1 year

## 2021-04-19 NOTE — Progress Notes (Signed)
PATIENT: Howard [name] DOB: September 03, 1955  REASON FOR VISIT: follow up HISTORY FROM: patient  Chief Complaint  Patient presents with   Follow-up    Pt alone, new rm. Overall is stable and denies any issues or concerns. [employer]      HISTORY OF PRESENT ILLNESS: 04/19/21 ALL: Howard [name] returns for follow up for OSA on CPAP. He continues to do very well. He is using CPAP nightly for at least 4 hours. He admits that he has a higher air leak. He can not tolerate mask tight on his face. He feels well rested and energized during the day. He does not wish to have mask refitting. He may consider travel CPAP as he has a son in [place] and another in [place].   Set up date 06/17/2019    11/09/2019 ALL:  Howard [name] is a 65 y.o. male here today for follow up for OSA on CPAP. He was sent for mask refitting at last visit due to elevated leak.  He admits that although the DME company has reached out to him, he has not pursued a mask refitting.  He does continue to note a leak on his machine.  He has not used his machine to help identify correct mask seal.  He does continue to note improvement in sleep quality and daytime energy.  Compliance report dated 10/06/2019 through 11/04/2019 reveals that he used CPAP 29 of the past 30 days for compliance of 97%.  He used CPAP greater than 4 hours 25 of the past 30 days for compliance of 83%.  Average usage was 5 hours and 44 minutes.  Residual AHI was 2.8 on 8 to 18 cm of water and an EPR of 1.  There is a significant leak in the 95th percentile of 67.3 L/min.  HISTORY: (copied from my note on 08/10/2019)  Howard [name] is a 65 y.o. male here today for follow up for OSA on CPAP. Sleep study in 04/2019 revealed severe OSA with AHI of 77.2 and hypoxemia for 153 minutes. O2 nadir of 62%. Autopap ordered with large FFM and optional humidity with pressure settings of 8-18cmH20.  He has noted significant improvements in sleep quality and  improvement in fatigue since starting CPAP therapy.   Compliance report dated 07/11/2019 through 08/09/2019 reveals that he used CPAP 29 of the last 30 days for compliance of 97%.  He used CPAP greater than 4 hours 29 of the last 30 days.  Average usage was 6 hours and 58 minutes.  Residual AHI was 4.7 on 8 to 18 cm of water and an EPR of 1.  There was a very high leak noted in the 95th percentile of 60.4 L/min.  He states that he has had a lot of difficulty with the air leaking around his mask.  He notes that he wakes in the morning with the bottom of the mask in his mouth.   HISTORY: (copied from Dr Edwena Felty note on 04/14/2019)   Howard [name] is a 66 y.o. year old Caucasian male patient seen on 04/14/2019 upon referral from Dr. Joylene Draft for a sleep consultation.  Chief concern according to patient : " I have a dental device but I am very sleepy"    I have the pleasure of seeing Howard HOLTZER today, a right-handed Caucasian male with a possible sleep disorder. He  has a past medical history of Anxiety, Borderline diabetes, Coronary artery disease, Disorder of tendon of right biceps, Family history of  adverse reaction to anesthesia, GERD (gastroesophageal reflux disease), Gout, History of esophageal spasm (2008), History of non-melanoma ( basalioma)  skin cancer, History of nuclear stress test (02/17/2009), Hyperlipidemia, Hypertension, Hypogonadism male, Hypothyroidism, Nocturia, OA (osteoarthritis), OSA (obstructive sleep apnea), and Right rotator cuff tear.   The patient had the first sleep study in the year 2010 at [facility] - severe apnea) - and later a home study through his dentist,  with a result of an AHI ( Apnea Hypopnea index)  Indicating OSA. He has been using a dental device since , but feels he is sleepier again.   Sleep relevant medical history: former smoker, shift worker, gout and arthritis pain, neck and toe, shoulder rotator cuff injuries, knee replacement  with pain.  Nocturia twice-  No Tonsillectomy, cervical spine trauma, deviated septum repair. Family medical /sleep history: no other family member on CPAP with OSA, father was a snorer.     Social history: Howard [name] worked for [employer], Patient is retired from EMS/ stroke  and lives in a household with 2 persons. Family status is married with 2 adult sons, 43 and 66, and grandchildren. Both sons out of state.  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 Tobacco use: former , 25 years.  ETOH use: socially, 3/ week  Caffeine intake in form of Coffee( 3 cups  ) Soda( some ) Tea ( at outside dinner) or energy drinks. Regular exercise in form of walking/ in PT for the shoulder .     Sleep habits are as follows: The patient's dinner time is between 6- 6.30 PM. He watches Tv and often is asleep in the den in a recliner by 9.30/ The patient goes to bed at 11 PM and continues to sleep for several  hours, wakes for bathroom breaks, the first time at 2-3 AM and again 4-5 AM. .   The preferred sleep position is left side ( caveat is shoulder pain) , with the support of 1 pillow. Dreams are reportedly frequent , but not acting out. He has called out in his sleep- rarely. 7 AM is the usual rise time.  The patient wakes up spontaneously.  Gets 8 hours of sleep- but reports not feeling refreshed or restored in AM, with symptoms such as dry mouth, no morning headaches, and residual fatigue.  Naps are taken after lunch  infrequently, lasting from 30 to 45 minutes and are more refreshing than nocturnal sleep.   REVIEW OF SYSTEMS: Out of a complete 14 system review of symptoms, the patient complains only of the following symptoms, none and all other reviewed systems are negative.   ALLERGIES: Allergies  Allergen Reactions   Sulfa Antibiotics Rash    HOME MEDICATIONS: Outpatient Medications Prior to Visit  Medication Sig Dispense Refill   albuterol  (VENTOLIN HFA) 108 (90 Base) MCG/ACT inhaler Inhale into the lungs as needed.     allopurinol (ZYLOPRIM) 300 MG tablet Take 300 mg by mouth every morning.      amLODipine (NORVASC) 10 MG tablet Take 10 mg by mouth daily.     aspirin EC 81 MG tablet Take 81 mg by mouth daily.     buPROPion (WELLBUTRIN XL) 150 MG 24 hr tablet bupropion HCl XL 150 mg 24 hr tablet, extended release     celecoxib (CELEBREX) 200 MG capsule Take 200 mg by mouth daily.     Coenzyme Q10 (CO Q-10) 200 MG CAPS daily.  colchicine 0.6 MG tablet as needed.     CRESTOR 20 MG tablet Take 10 mg by mouth daily.   0   doxazosin (CARDURA) 2 MG tablet Take 2 mg by mouth daily.     DULoxetine (CYMBALTA) 60 MG capsule Take 60 mg by mouth every morning.     famotidine (PEPCID) 20 MG tablet Take 20 mg by mouth daily.      fluticasone (FLONASE) 50 MCG/ACT nasal spray Place 2 sprays into both nostrils as needed.     guaiFENesin (MUCINEX) 600 MG 12 hr tablet as needed.     levocetirizine (XYZAL) 5 MG tablet daily.     levothyroxine (SYNTHROID, LEVOTHROID) 175 MCG tablet Take 175 mcg by mouth every morning.      Multiple Vitamin (MULTIVITAMIN) tablet Take 1 tablet by mouth daily.     Omega-3 Fatty Acids (FISH OIL) 1000 MG CAPS Take by mouth.     sildenafil (VIAGRA) 100 MG tablet Take 50 mg by mouth once as needed.      testosterone (ANDROGEL) 50 MG/5GM (1%) GEL Place 5 g onto the skin 2 (two) times daily.     valsartan-hydrochlorothiazide (DIOVAN-HCT) 320-25 MG tablet Take 1 tablet by mouth daily.     No facility-administered medications prior to visit.    PAST MEDICAL HISTORY: Past Medical History:  Diagnosis Date   Anxiety    Borderline diabetes    Coronary artery disease    cardiologist-  dr Marlou Porch--- coronary CT 09-11-2017 showed non-obstructive mild diffuse cad RCA and ostial LAD and moderate midLAD,     Disorder of tendon of right biceps    dislocation   Family history of adverse reaction to anesthesia    FATHER  REACTED TO LIDOCAINE    GERD (gastroesophageal reflux disease)    Gout    per pt stable   History of esophageal spasm 2008   History of nonmelanoma skin cancer    History of nuclear stress test 02/17/2009   normal , no ishemia w/ ef 60%   Hyperlipidemia    Hypertension    Hypogonadism male    followed by pcp   Hypothyroidism    followed by pcp   Nocturia    OA (osteoarthritis)    OSA (obstructive sleep apnea)    per pt has not used for severeal yrs, currently using mouth appliance   Right rotator cuff tear     PAST SURGICAL HISTORY: Past Surgical History:  Procedure Laterality Date   ANAL FISSURE REPAIR  yrs ago   CARPAL TUNNEL RELEASE  2012 /2013   bil   CATARACT EXTRACTION W/ INTRAOCULAR LENS  IMPLANT, BILATERAL  june 9 & 16-- 2020   INJECTION KNEE Left 12/29/2018   Procedure: LEFT KNEE ASPIRATION AND INJECTION;  Surgeon: Tania Ade, MD;  Location: [facility];  Service: Orthopedics;  Laterality: Left;   KNEE ARTHROSCOPY  12/12/2011   Procedure: ARTHROSCOPY KNEE;  Surgeon: Gearlean Alf, MD;  Location: [facility];  Service: Orthopedics;  Laterality: Left;  medial menisectomy and chondroplasty   KNEE ARTHROSCOPY Left 10/29/2012   Procedure: LEFT ARTHROSCOPY KNEE WITH CHONDROPLASTY AND DEBRIDMENT OF MENISCUS;  Surgeon: Gearlean Alf, MD;  Location: [facility];  Service: Orthopedics;  Laterality: Left;   KNEE SURGERY Bilateral right 1973 /  left 1985 /   right [place] Left 08-02-2008    dr Beola Cord  @[facility]    SHOULDER ARTHROSCOPY WITH ROTATOR CUFF REPAIR Right 12/29/2018   Procedure: SHOULDER  ARTHROSCOPY WITH ROTATOR CUFF REPAIR and  SUBACROMIAL DECOMPRESSION AND BICEP TENOTOMY;  Surgeon: Tania Ade, MD;  Location: [facility];  Service: Orthopedics;  Laterality: Right;   SHOULDER ARTHROSCOPY WITH ROTATOR CUFF REPAIR AND SUBACROMIAL DECOMPRESSION Right 04-09-2009   dr supple  @[facility]    w/ labral debridement and dcr   TOTAL KNEE  ARTHROPLASTY Right 07/05/2014   Procedure: RIGHT TOTAL KNEE ARTHROPLASTY;  Surgeon: Gearlean Alf, MD;  Location: [facility];  Service: Orthopedics;  Laterality: Right;    FAMILY HISTORY: Family History  Problem Relation Age of Onset   Heart disease Father    Hypertension Father     SOCIAL HISTORY: Social History   Socioeconomic History   Marital status: Married    Spouse name: Not on file   Number of children: Not on file   Years of education: Not on file   Highest education level: Not on file  Occupational History   Not on file  Tobacco Use   Smoking status: Former    Years: 30.00    Types: Cigarettes    Quit date: 12/05/2000    Years since quitting: 20.3   Smokeless tobacco: Never  Vaping Use   Vaping Use: Never used  Substance and Sexual Activity   Alcohol use: Yes    Comment: occassional   Drug use: Never   Sexual activity: Not on file  Other Topics Concern   Not on file  Social History Narrative   Not on file   Social Determinants of Health   Financial Resource Strain: Not on file  Food Insecurity: Not on file  Transportation Needs: Not on file  Physical Activity: Not on file  Stress: Not on file  Social Connections: Not on file  Intimate Partner Violence: Not on file      PHYSICAL EXAM  Vitals:   04/19/21 0907  BP: 125/71  Pulse: 68  Weight: 262 lb (118.8 kg)  Height: 6\' 2"  (1.88 m)    Body mass index is 33.64 kg/m.  Generalized: Well developed, in no acute distress  Cardiology: normal rate and rhythm, no murmur noted Respiratory: clear to auscultation bilaterally  Neurological examination  Mentation: Alert oriented to time, place, history taking. Follows all commands speech and language fluent Cranial nerve II-XII: Pupils were equal round reactive to light. Extraocular movements were full Motor: The motor testing reveals 5 over 5 strength of all 4 extremities. Good symmetric motor tone is noted throughout.  Gait and station: Gait is  normal.   DIAGNOSTIC DATA (LABS, IMAGING, TESTING) - I reviewed patient records, labs, notes, testing and imaging myself where available.  No flowsheet data found.   Lab Results  Component Value Date   WBC 4.1 04/18/2020   HGB 13.1 04/18/2020   HCT 38.5 (L) 04/18/2020   MCV 90.2 04/18/2020   PLT 190 04/18/2020      Component Value Date/Time   NA 135 12/05/2020 1001   K 3.8 12/05/2020 1001   CL 98 12/05/2020 1001   CO2 23 12/05/2020 1001   GLUCOSE 126 (H) 12/05/2020 1001   GLUCOSE 123 (H) 04/18/2020 2315   BUN 17 12/05/2020 1001   CREATININE 0.82 12/05/2020 1001   CALCIUM 9.4 12/05/2020 1001   PROT 7.7 06/29/2014 1125   ALBUMIN 4.4 06/29/2014 1125   AST 42 (H) 06/29/2014 1125   ALT 33 06/29/2014 1125   ALKPHOS 66 06/29/2014 1125   BILITOT 0.7 06/29/2014 1125   GFRNONAA >60 04/18/2020 2315   GFRAA 106 08/13/2017  7   Lab Results  Component Value Date   CHOL 122 02/07/2010   HDL 39.70 02/07/2010   LDLCALC 67 02/07/2010   LDLDIRECT 180.9 11/19/2006   TRIG 79.0 02/07/2010   CHOLHDL 3 02/07/2010   Lab Results  Component Value Date   HGBA1C 6.3 02/07/2010   No results found for: VITAMINB12 Lab Results  Component Value Date   TSH 3.82 02/07/2010       ASSESSMENT AND PLAN 65 y.o. year old male  has a past medical history of Anxiety, Borderline diabetes, Coronary artery disease, Disorder of tendon of right biceps, Family history of adverse reaction to anesthesia, GERD (gastroesophageal reflux disease), Gout, History of esophageal spasm (2008), History of nonmelanoma skin cancer, History of nuclear stress test (02/17/2009), Hyperlipidemia, Hypertension, Hypogonadism male, Hypothyroidism, Nocturia, OA (osteoarthritis), OSA (obstructive sleep apnea), and Right rotator cuff tear. here with     ICD-10-CM   1. OSA on CPAP  G47.33 For home use only DME continuous positive airway pressure (CPAP)   Z99.89        Keelen continues to note benefit with CPAP therapy.  Compliance report reveals optimal compliance.  Unfortunately, concerns of a large leak continue. He is happy with his full facemask and does not wish to try anything new.  He would like to continue working on tightening his headgear at home.  AHI is slightly elevated at 5.8/hr. I will recheck download in 8-10 weeks to ensure  adequate control. He was encouraged to continue using CPAP nightly and for greater than 4 hours each night.  He will follow up with me in 1 year, sooner if needed.  He verbalizes understanding and agreement with this plan.  Orders Placed This Encounter  Procedures   For home use only DME continuous positive airway pressure (CPAP)    Supplies    Order Specific Question:   Length of Need    Answer:   Lifetime    Order Specific Question:   Patient has OSA or probable OSA    Answer:   Yes    Order Specific Question:   Is the patient currently using CPAP in the home    Answer:   Yes    Order Specific Question:   Settings    Answer:   Other see comments    Order Specific Question:   CPAP supplies needed    Answer:   Mask, headgear, cushions, filters, heated tubing and water chamber      No orders of the defined types were placed in this encounter.     Debbora Presto, FNP-C 04/19/2021, 9:33 AM [facility] [address] [place], [place] [address] [phone]

## 2021-05-15 ENCOUNTER — Ambulatory Visit: Payer: Medicare Other | Admitting: Family Medicine

## 2021-06-23 ENCOUNTER — Ambulatory Visit: Payer: Medicare Other | Admitting: Internal Medicine

## 2021-06-27 ENCOUNTER — Telehealth: Payer: Self-pay | Admitting: Family Medicine

## 2021-06-27 NOTE — Telephone Encounter (Signed)
CPAP message Mychart

## 2021-07-18 NOTE — Progress Notes (Signed)
Cardiology Office Note:    Date:  07/19/2021   ID:  Howard [name], DOB 11-09-1955, MRN [id number]  PCP:  Crist Infante, MD   [facility] Providers Cardiologist:  Werner Lean, MD     Referring MD: Crist Infante, MD   CC: DOE f/u  History of Present Illness:    Howard [name] is a 66 y.o. male with a hx of moderate non-obstructive CAD, HTN, OSA on CPAP who presented 12/01/20.  In interim of this visit, patient has minimal non obstructive CAD.  Normal EF.  Seen 01/30/21. In interim of this visit, patient had good trip in [place].  Has similar CCTA and echo.  Patient notes that he is dealing with an infection and chest cold and and had multiple infections.  Start on antibiotics..   Back from [place] and did well.  There are no interval hospital/ED visit.    No chest pain or pressure.  No SOB/DOE unit his recent  illness. and no PND/Orthopnea.  No weight gain or leg swelling.  No palpitations or syncope .  Ambulatory blood pressure is normal at home.   Past Medical History:  Diagnosis Date   Anxiety    Borderline diabetes    Coronary artery disease    cardiologist-  dr Marlou Porch--- coronary CT 09-11-2017 showed non-obstructive mild diffuse cad RCA and ostial LAD and moderate midLAD,     Disorder of tendon of right biceps    dislocation   Family history of adverse reaction to anesthesia    FATHER REACTED TO LIDOCAINE    GERD (gastroesophageal reflux disease)    Gout    per pt stable   History of esophageal spasm 2008   History of nonmelanoma skin cancer    History of nuclear stress test 02/17/2009   normal , no ishemia w/ ef 60%   Hyperlipidemia    Hypertension    Hypogonadism male    followed by pcp   Hypothyroidism    followed by pcp   Nocturia    OA (osteoarthritis)    OSA (obstructive sleep apnea)    per pt has not used for severeal yrs, currently using mouth appliance   Right rotator cuff tear     Past Surgical History:  Procedure Laterality  Date   ANAL FISSURE REPAIR  yrs ago   CARPAL TUNNEL RELEASE  2012 /2013   bil   CATARACT EXTRACTION W/ INTRAOCULAR LENS  IMPLANT, BILATERAL  june 9 & 16-- 2020   INJECTION KNEE Left 12/29/2018   Procedure: LEFT KNEE ASPIRATION AND INJECTION;  Surgeon: Tania Ade, MD;  Location: [facility];  Service: Orthopedics;  Laterality: Left;   KNEE ARTHROSCOPY  12/12/2011   Procedure: ARTHROSCOPY KNEE;  Surgeon: Gearlean Alf, MD;  Location: [facility];  Service: Orthopedics;  Laterality: Left;  medial menisectomy and chondroplasty   KNEE ARTHROSCOPY Left 10/29/2012   Procedure: LEFT ARTHROSCOPY KNEE WITH CHONDROPLASTY AND DEBRIDMENT OF MENISCUS;  Surgeon: Gearlean Alf, MD;  Location: [facility];  Service: Orthopedics;  Laterality: Left;   KNEE SURGERY Bilateral right 1973 /  left 1985 /   right [place] Left 08-02-2008    dr Beola Cord  @[facility]    SHOULDER ARTHROSCOPY WITH ROTATOR CUFF REPAIR Right 12/29/2018   Procedure: SHOULDER ARTHROSCOPY WITH ROTATOR CUFF REPAIR and  SUBACROMIAL DECOMPRESSION AND BICEP TENOTOMY;  Surgeon: Tania Ade, MD;  Location: [facility];  Service: Orthopedics;  Laterality: Right;   SHOULDER ARTHROSCOPY  WITH ROTATOR CUFF REPAIR AND SUBACROMIAL DECOMPRESSION Right 04-09-2009   dr supple  @[facility]    w/ labral debridement and dcr   TOTAL KNEE ARTHROPLASTY Right 07/05/2014   Procedure: RIGHT TOTAL KNEE ARTHROPLASTY;  Surgeon: Gearlean Alf, MD;  Location: [facility];  Service: Orthopedics;  Laterality: Right;    Current Medications: Current Meds  Medication Sig   acetaminophen (TYLENOL) 650 MG CR tablet Take by mouth as needed.   albuterol (VENTOLIN HFA) 108 (90 Base) MCG/ACT inhaler Inhale into the lungs as needed.   allopurinol (ZYLOPRIM) 300 MG tablet Take 300 mg by mouth every morning.    amLODipine (NORVASC) 10 MG tablet Take 10 mg by mouth daily.   aspirin EC 81 MG tablet Take 81 mg by mouth daily.   benzonatate (TESSALON)  100 MG capsule Take 100 mg by mouth as needed.   buPROPion (WELLBUTRIN XL) 150 MG 24 hr tablet bupropion HCl XL 150 mg 24 hr tablet, extended release   celecoxib (CELEBREX) 200 MG capsule Take 200 mg by mouth daily.   clarithromycin (BIAXIN) 500 MG tablet Take 500 mg by mouth 2 (two) times daily.   Coenzyme Q10 (CO Q-10) 200 MG CAPS daily.   colchicine 0.6 MG tablet as needed.   CRESTOR 20 MG tablet Take 10 mg by mouth daily.    doxazosin (CARDURA) 2 MG tablet Take 2 mg by mouth daily.   DULoxetine (CYMBALTA) 60 MG capsule Take 60 mg by mouth every morning.   famotidine (PEPCID) 20 MG tablet Take 20 mg by mouth daily.    fluticasone (FLONASE) 50 MCG/ACT nasal spray Place 2 sprays into both nostrils as needed.   guaiFENesin (MUCINEX) 600 MG 12 hr tablet as needed.   levocetirizine (XYZAL) 5 MG tablet daily.   levothyroxine (SYNTHROID, LEVOTHROID) 175 MCG tablet Take 175 mcg by mouth every morning.    methylPREDNISolone (MEDROL DOSEPAK) 4 MG TBPK tablet See admin instructions. follow package directions   Multiple Vitamin (MULTIVITAMIN) tablet Take 1 tablet by mouth daily.   Omega-3 Fatty Acids (FISH OIL) 1000 MG CAPS Take by mouth.   sildenafil (VIAGRA) 100 MG tablet Take 50 mg by mouth once as needed.    testosterone (ANDROGEL) 50 MG/5GM (1%) GEL Place 5 g onto the skin 2 (two) times daily.   valsartan-hydrochlorothiazide (DIOVAN-HCT) 320-25 MG tablet Take 1 tablet by mouth daily.     Allergies:   Sulfa antibiotics   Social History   Socioeconomic History   Marital status: Married    Spouse name: Not on file   Number of children: Not on file   Years of education: Not on file   Highest education level: Not on file  Occupational History   Not on file  Tobacco Use   Smoking status: Former    Years: 30.00    Types: Cigarettes    Quit date: 12/05/2000    Years since quitting: 20.6   Smokeless tobacco: Never  Vaping Use   Vaping Use: Never used  Substance and Sexual Activity    Alcohol use: Yes    Comment: occassional   Drug use: Never   Sexual activity: Not on file  Other Topics Concern   Not on file  Social History Narrative   Not on file   Social Determinants of Health   Financial Resource Strain: Not on file  Food Insecurity: Not on file  Transportation Needs: Not on file  Physical Activity: Not on file  Stress: Not on file  Social Connections: Not  on file    Social: has two sons on the [employer] 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  ROS:   Please see the history of present illness.     All other systems reviewed and are negative.  EKGs/Labs/Other Studies Reviewed:    The following studies were reviewed today:  EKG:   07/19/21:  SR Rate 62 04/19/20: SR 68  Transthoracic Echocardiogram: Date: 12/15/2020 Results:  1. Left ventricular ejection fraction, by estimation, is 60 to 65%. The  left ventricle has normal function. The left ventricle has no regional  wall motion abnormalities. Left ventricular diastolic parameters were  normal.   2. Right ventricular systolic function is normal. The right ventricular  size is normal. Tricuspid regurgitation signal is inadequate for assessing  PA pressure.   3. The mitral valve is normal in structure. Trivial mitral valve  regurgitation. No evidence of mitral stenosis.   4. The aortic valve is tricuspid. There is mild thickening of the aortic  valve. Aortic valve regurgitation is not visualized. Mild aortic valve  sclerosis is present, with no evidence of aortic valve stenosis.   5. Aortic dilatation noted. There is mild dilatation of the ascending  aorta, measuring 37 mm. (WNL for age and BSA)  6. The inferior vena cava is normal in size with greater than 50%  respiratory variability, suggesting right atrial pressure of 3 mmHg.   Cardiac CT   Date:09/11/2017 Results: IMPRESSION: 1. Coronary calcium score of 112. This was 71 percentile for age and sex matched control.   2. Normal coronary origin with right dominance.   3. Mild diffuse CAD in the RCA and ostial LAD, moderate CAD in the mid LAD. Additional analysis with CT FFR will be submitted. IMPRESSION: 1.  CT FFR analysis didn't show any significant stenosis.  Date: 12/15/20 Results: IMPRESSION: 1. Coronary calcium score of 317. This was 70th percentile for age and sex matched control.   2. Normal coronary origin with right dominance.   3. Nonobstructive CAD. Most significant lesion is calcified plaque in mid LAD that causes mild (25-49%) stenosis    ABI and Duplex Date 08/20/2017 Results:  Final Interpretation:  Right: Resting right ankle-brachial index is within normal range. No  evidence of significant right lower extremity arterial disease. The right  toe-brachial index is normal.  Left: Resting left ankle-brachial index is within normal range. No  evidence of significant left lower extremity arterial disease. The left  toe-brachial index is normal.   Recent Labs: 12/05/2020: BUN 17; Creatinine, Ser 0.82; Potassium 3.8; Sodium 135  Recent Lipid Panel    Component Value Date/Time   CHOL 122 02/07/2010 0914   TRIG 79.0 02/07/2010 0914   HDL 39.70 02/07/2010 0914   CHOLHDL 3 02/07/2010 0914   VLDL 15.8 02/07/2010 0914   LDLCALC 67 02/07/2010 0914   LDLDIRECT 180.9 11/19/2006 0933    Physical Exam:    VS:  BP 130/68    Pulse 62    Ht 6\' 2"  (1.88 m)    Wt 118.4 kg    SpO2 96%    BMI 33.51 kg/m     Wt Readings from Last 3 Encounters:  07/19/21 118.4 kg  04/19/21 118.8 kg  01/30/21 118.8 kg     Gen: No distress  Neck: No JVD,  Cardiac: No Rubs or Gallops, no murmur, regular rate +2 radial  pulses Respiratory:  Expiratory rhonchi,  decreased breath sounds LUL field normal effort, normal  respiratory rate GI: Soft, nontender, non-distended  MS:  No  edema;  moves all extremities Integument: Skin feels warm Neuro:  At time of evaluation, alert and oriented to person/place/time/situation  Psych: Normal affect, patient feels alright   ASSESSMENT:    1. Coronary artery disease of native artery of native heart with stable angina pectoris ([place])   2. Essential hypertension   3. OSA (obstructive sleep apnea)      PLAN:    Non-Obstructive CAD and Aortic Atherosclerosis HTN DOE OSA on CPAP - continue ASA 81 mg Po Daily - no angina on present medications - BP controlled on norvasc 10 mg, doxazosin 2 mg PO Daily; valsartan and HCTZ - LDL at goal (< 55) with rosuvastatin 20 mg PO Daily  - patient is on Omega 3 Fatty Acids  - palpitations have resolved - claudication type symptoms have resolved - CPET suggestive of deconditioning 9/22 - [facility] PCP has he may presently have PNA, he is on second round of antibiotics  Six month with me   Medication Adjustments/Labs and Tests Ordered: Current medicines are reviewed at length with the patient today.  Concerns regarding medicines are outlined above.  Orders Placed This Encounter  Procedures   EKG 12-Lead     No orders of the defined types were placed in this encounter.     Patient Instructions  Medication Instructions:  Your physician recommends that you continue on your current medications as directed. Please refer to the Current Medication list given to you today.  *If you need a refill on your cardiac medications before your next appointment, please call your pharmacy*   Lab Work: NONE If you have labs (blood work) drawn today and your tests are completely normal, you will receive your results only by: [place] (if you have MyChart) OR A paper copy in the mail If you have any lab test that is abnormal or we need to change your treatment, we will call you to review the results.   Testing/Procedures: NONE   Follow-Up: At [facility], you and your  health needs are our priority.  As part of our continuing mission to provide you with exceptional heart care, we have created designated Provider Care Teams.  These Care Teams include your primary Cardiologist (physician) and Advanced Practice Providers (APPs -  Physician Assistants and Nurse Practitioners) who all work together to provide you with the care you need, when you need it.   Your next appointment:   6 month(s)  The format for your next appointment:   In Person  Provider:   Werner Lean, MD        Signed, Werner Lean, MD  07/19/2021 8:53 AM    [place]

## 2021-07-19 ENCOUNTER — Ambulatory Visit (INDEPENDENT_AMBULATORY_CARE_PROVIDER_SITE_OTHER): Payer: Medicare Other | Admitting: Internal Medicine

## 2021-07-19 ENCOUNTER — Encounter: Payer: Self-pay | Admitting: Internal Medicine

## 2021-07-19 ENCOUNTER — Other Ambulatory Visit: Payer: Self-pay

## 2021-07-19 VITALS — BP 130/68 | HR 62 | Ht 74.0 in | Wt 261.0 lb

## 2021-07-19 DIAGNOSIS — G4733 Obstructive sleep apnea (adult) (pediatric): Secondary | ICD-10-CM | POA: Diagnosis not present

## 2021-07-19 DIAGNOSIS — I25118 Atherosclerotic heart disease of native coronary artery with other forms of angina pectoris: Secondary | ICD-10-CM

## 2021-07-19 DIAGNOSIS — I10 Essential (primary) hypertension: Secondary | ICD-10-CM | POA: Diagnosis not present

## 2021-07-19 NOTE — Patient Instructions (Signed)
Medication Instructions:  Your physician recommends that you continue on your current medications as directed. Please refer to the Current Medication list given to you today.  *If you need a refill on your cardiac medications before your next appointment, please call your pharmacy*   Lab Work: NONE If you have labs (blood work) drawn today and your tests are completely normal, you will receive your results only by: [place] (if you have MyChart) OR A paper copy in the mail If you have any lab test that is abnormal or we need to change your treatment, we will call you to review the results.   Testing/Procedures: NONE   Follow-Up: At [facility], you and your health needs are our priority.  As part of our continuing mission to provide you with exceptional heart care, we have created designated Provider Care Teams.  These Care Teams include your primary Cardiologist (physician) and Advanced Practice Providers (APPs -  Physician Assistants and Nurse Practitioners) who all work together to provide you with the care you need, when you need it.   Your next appointment:   6 month(s)  The format for your next appointment:   In Person  Provider:   Werner Lean, MD

## 2022-01-17 ENCOUNTER — Encounter: Payer: Self-pay | Admitting: Internal Medicine

## 2022-01-17 ENCOUNTER — Ambulatory Visit (INDEPENDENT_AMBULATORY_CARE_PROVIDER_SITE_OTHER): Payer: Medicare Other | Admitting: Internal Medicine

## 2022-01-17 VITALS — BP 118/70 | HR 65 | Ht 74.0 in | Wt 255.4 lb

## 2022-01-17 DIAGNOSIS — I10 Essential (primary) hypertension: Secondary | ICD-10-CM

## 2022-01-17 DIAGNOSIS — I700 Atherosclerosis of aorta: Secondary | ICD-10-CM | POA: Diagnosis not present

## 2022-01-17 DIAGNOSIS — G4733 Obstructive sleep apnea (adult) (pediatric): Secondary | ICD-10-CM | POA: Diagnosis not present

## 2022-01-17 DIAGNOSIS — I25118 Atherosclerotic heart disease of native coronary artery with other forms of angina pectoris: Secondary | ICD-10-CM

## 2022-01-17 NOTE — Patient Instructions (Signed)
Medication Instructions:  ?Your physician recommends that you continue on your current medications as directed. Please refer to the Current Medication list given to you today. ? ?*If you need a refill on your cardiac medications before your next appointment, please call your pharmacy* ? ? ?Lab Work: ?NONE ?If you have labs (blood work) drawn today and your tests are completely normal, you will receive your results only by: ?MyChart Message (if you have MyChart) OR ?A paper copy in the mail ?If you have any lab test that is abnormal or we need to change your treatment, we will call you to review the results. ? ? ?Testing/Procedures: ?NONE ? ? ?Follow-Up: ?At [facility], you and your health needs are our priority.  As part of our continuing mission to provide you with exceptional heart care, we have created designated Provider Care Teams.  These Care Teams include your primary Cardiologist (physician) and Advanced Practice Providers (APPs -  Physician Assistants and Nurse Practitioners) who all work together to provide you with the care you need, when you need it. ? ?We recommend signing up for the patient portal called "MyChart".  Sign up information is provided on this After Visit Summary.  MyChart is used to connect with patients for Virtual Visits (Telemedicine).  Patients are able to view lab/test results, encounter notes, upcoming appointments, etc.  Non-urgent messages can be sent to your provider as well.   ?To learn more about what you can do with MyChart, go to [email or web address].   ? ?Your next appointment:   ?1 year(s) ? ?The format for your next appointment:   ?In Person ? ?Provider:   ?Mahesh A Chandrasekhar, MD   ? ? ?Important Information About Sugar ? ? ? ? ?  ?

## 2022-01-17 NOTE — Progress Notes (Signed)
Cardiology Office Note:    Date:  01/17/2022   ID:  Howard [name], DOB 18-Sep-1955, MRN [id number]  PCP:  Crist Infante, MD   [facility] Providers Cardiologist:  Werner Lean, MD     Referring MD: Crist Infante, MD   CC: DOE f/u  History of Present Illness:    Howard [name] is a 66 y.o. male with a hx of moderate non-obstructive CAD, HTN, OSA on CPAP who presented 12/01/20. 2022:  minimal non obstructive CAD.  Normal EF.  2023: has PNA in February 2023.  Patient notes that he is doing well.   Since last visit notes did well in [place], has been with grandchildren, but feels a bit better. There are no interval hospital/ED visit.   Dealing with hip and knee pain.  No chest pain or pressure .  No SOB/DOE and no PND/Orthopnea.  No weight gain or leg swelling.  No palpitations or syncope.  Still has some breathlessness.  Potentially getting knee surgery in November.   Past Medical History:  Diagnosis Date   Anxiety    Borderline diabetes    Coronary artery disease    cardiologist-  dr Marlou Porch--- coronary CT 09-11-2017 showed non-obstructive mild diffuse cad RCA and ostial LAD and moderate midLAD,     Disorder of tendon of right biceps    dislocation   Family history of adverse reaction to anesthesia    FATHER REACTED TO LIDOCAINE    GERD (gastroesophageal reflux disease)    Gout    per pt stable   History of esophageal spasm 2008   History of nonmelanoma skin cancer    History of nuclear stress test 02/17/2009   normal , no ishemia w/ ef 60%   Hyperlipidemia    Hypertension    Hypogonadism male    followed by pcp   Hypothyroidism    followed by pcp   Nocturia    OA (osteoarthritis)    OSA (obstructive sleep apnea)    per pt has not used for severeal yrs, currently using mouth appliance   Right rotator cuff tear     Past Surgical History:  Procedure Laterality Date   ANAL FISSURE REPAIR  yrs ago   CARPAL TUNNEL RELEASE  2012 /2013   bil    CATARACT EXTRACTION W/ INTRAOCULAR LENS  IMPLANT, BILATERAL  june 9 & 16-- 2020   INJECTION KNEE Left 12/29/2018   Procedure: LEFT KNEE ASPIRATION AND INJECTION;  Surgeon: Tania Ade, MD;  Location: [place];  Service: Orthopedics;  Laterality: Left;   KNEE ARTHROSCOPY  12/12/2011   Procedure: ARTHROSCOPY KNEE;  Surgeon: Gearlean Alf, MD;  Location: [facility];  Service: Orthopedics;  Laterality: Left;  medial menisectomy and chondroplasty   KNEE ARTHROSCOPY Left 10/29/2012   Procedure: LEFT ARTHROSCOPY KNEE WITH CHONDROPLASTY AND DEBRIDMENT OF MENISCUS;  Surgeon: Gearlean Alf, MD;  Location: [facility];  Service: Orthopedics;  Laterality: Left;   KNEE SURGERY Bilateral right 1973 /  left 1985 /   right 1993   PLANTAR FASCIA SURGERY Left 08-02-2008    dr Beola Cord  '@[facility]'$    SHOULDER ARTHROSCOPY WITH ROTATOR CUFF REPAIR Right 12/29/2018   Procedure: SHOULDER ARTHROSCOPY WITH ROTATOR CUFF REPAIR and  SUBACROMIAL DECOMPRESSION AND BICEP TENOTOMY;  Surgeon: Tania Ade, MD;  Location: [place];  Service: Orthopedics;  Laterality: Right;   SHOULDER ARTHROSCOPY WITH ROTATOR CUFF REPAIR AND SUBACROMIAL DECOMPRESSION Right 04-09-2009   dr supple  '@[facility]'$    w/  labral debridement and dcr   TOTAL KNEE ARTHROPLASTY Right 07/05/2014   Procedure: RIGHT TOTAL KNEE ARTHROPLASTY;  Surgeon: Gearlean Alf, MD;  Location: [facility];  Service: Orthopedics;  Laterality: Right;    Current Medications: Current Meds  Medication Sig   acetaminophen (TYLENOL) 650 MG CR tablet Take by mouth as needed.   albuterol (VENTOLIN HFA) 108 (90 Base) MCG/ACT inhaler Inhale into the lungs as needed.   allopurinol (ZYLOPRIM) 300 MG tablet Take 300 mg by mouth every morning.    amLODipine (NORVASC) 10 MG tablet Take 10 mg by mouth daily.   aspirin EC 81 MG tablet Take 81 mg by mouth daily.   buPROPion (WELLBUTRIN XL) 150 MG 24 hr tablet bupropion HCl XL 150 mg 24 hr tablet, extended release    celecoxib (CELEBREX) 200 MG capsule Take 200 mg by mouth daily.   Coenzyme Q10 (CO Q-10) 200 MG CAPS daily.   colchicine 0.6 MG tablet as needed.   CRESTOR 20 MG tablet Take 10 mg by mouth daily.    doxazosin (CARDURA) 2 MG tablet Take 2 mg by mouth daily.   DULoxetine (CYMBALTA) 60 MG capsule Take 60 mg by mouth every morning.   famotidine (PEPCID) 20 MG tablet Take 20 mg by mouth daily.    fluticasone (FLONASE) 50 MCG/ACT nasal spray Place 2 sprays into both nostrils as needed.   levocetirizine (XYZAL) 5 MG tablet daily.   levothyroxine (SYNTHROID, LEVOTHROID) 175 MCG tablet Take 175 mcg by mouth every morning.    montelukast (SINGULAIR) 10 MG tablet Take 10 mg by mouth as needed for allergies.   Multiple Vitamin (MULTIVITAMIN) tablet Take 1 tablet by mouth daily.   Omega-3 Fatty Acids (FISH OIL) 1000 MG CAPS Take by mouth.   sildenafil (VIAGRA) 100 MG tablet Take 50 mg by mouth once as needed.    testosterone (ANDROGEL) 50 MG/5GM (1%) GEL Place 5 g onto the skin 2 (two) times daily.   valsartan-hydrochlorothiazide (DIOVAN-HCT) 320-25 MG tablet Take 1 tablet by mouth daily.   [DISCONTINUED] benzonatate (TESSALON) 100 MG capsule Take 100 mg by mouth as needed.   [DISCONTINUED] clarithromycin (BIAXIN) 500 MG tablet Take 500 mg by mouth 2 (two) times daily.   [DISCONTINUED] guaiFENesin (MUCINEX) 600 MG 12 hr tablet as needed.   [DISCONTINUED] methylPREDNISolone (MEDROL DOSEPAK) 4 MG TBPK tablet See admin instructions. follow package directions     Allergies:   Sulfa antibiotics   Social History   Socioeconomic History   Marital status: Married    Spouse name: Not on file   Number of children: Not on file   Years of education: Not on file   Highest education level: Not on file  Occupational History   Not on file  Tobacco Use   Smoking status: Former    Years: 30.00    Types: Cigarettes    Quit date: 12/05/2000    Years since quitting: 21.1   Smokeless tobacco: Never  Vaping Use    Vaping Use: Never used  Substance and Sexual Activity   Alcohol use: Yes    Comment: occassional   Drug use: Never   Sexual activity: Not on file  Other Topics Concern   Not on file  Social History Narrative   Not on file   Social Determinants of Health   Financial Resource Strain: Not on file  Food Insecurity: Not on file  Transportation Needs: Not on file  Physical Activity: Not on file  Stress: Not on file  Social  Connections: Not on file    Social: has two sons on the [place] [employer] 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  ROS:   Please see the history of present illness.     All other systems reviewed and are negative.  EKGs/Labs/Other Studies Reviewed:    The following studies were reviewed today:  EKG:   07/19/21:  SR Rate 62 04/19/20: SR 68  Transthoracic Echocardiogram: Date: 12/15/2020 Results:  1. Left ventricular ejection fraction, by estimation, is 60 to 65%. The  left ventricle has normal function. The left ventricle has no regional  wall motion abnormalities. Left ventricular diastolic parameters were  normal.   2. Right ventricular systolic function is normal. The right ventricular  size is normal. Tricuspid regurgitation signal is inadequate for assessing  PA pressure.   3. The mitral valve is normal in structure. Trivial mitral valve  regurgitation. No evidence of mitral stenosis.   4. The aortic valve is tricuspid. There is mild thickening of the aortic  valve. Aortic valve regurgitation is not visualized. Mild aortic valve  sclerosis is present, with no evidence of aortic valve stenosis.   5. Aortic dilatation noted. There is mild dilatation of the ascending  aorta, measuring 37 mm. (WNL for age and BSA)  6. The inferior vena cava is normal in size with greater than 50%  respiratory variability,  suggesting right atrial pressure of 3 mmHg.   Cardiac CT  Date:09/11/2017 Results: IMPRESSION: 1. Coronary calcium score of 112. This was 50 percentile for age and sex matched control.   2. Normal coronary origin with right dominance.   3. Mild diffuse CAD in the RCA and ostial LAD, moderate CAD in the mid LAD. Additional analysis with CT FFR will be submitted. IMPRESSION: 1.  CT FFR analysis didn't show any significant stenosis.  Date: 12/15/20 Results: IMPRESSION: 1. Coronary calcium score of 317. This was 70th percentile for age and sex matched control.   2. Normal coronary origin with right dominance.   3. Nonobstructive CAD. Most significant lesion is calcified plaque in mid LAD that causes mild (25-49%) stenosis    ABI and Duplex Date 08/20/2017 Results:  Final Interpretation:  Right: Resting right ankle-brachial index is within normal range. No  evidence of significant right lower extremity arterial disease. The right  toe-brachial index is normal.  Left: Resting left ankle-brachial index is within normal range. No  evidence of significant left lower extremity arterial disease. The left  toe-brachial index is normal.   Recent Labs: No results found for requested labs within last 365 days.  Recent Lipid Panel    Component Value Date/Time   CHOL 122 02/07/2010 0914   TRIG 79.0 02/07/2010 0914   HDL 39.70 02/07/2010 0914   CHOLHDL 3 02/07/2010 0914   VLDL 15.8 02/07/2010 0914   LDLCALC 67 02/07/2010 0914   LDLDIRECT 180.9 11/19/2006 0933    Physical Exam:    VS:  BP 118/70   Pulse 65   Ht '6\' 2"'$  (1.88 m)   Wt 255 lb 6.4 oz (115.8 kg)   SpO2 95%   BMI 32.79 kg/m     Wt Readings from Last 3 Encounters:  01/17/22 255 lb 6.4 oz (115.8 kg)  07/19/21 261 lb (118.4 kg)  04/19/21 262 lb (118.8 kg)    Gen: no distress   Neck: No JVD Cardiac: No Rubs  or Gallops, no murmur, RRR +2 radial pulses Respiratory: Clear to auscultation bilaterally, no effort, no   respiratory rate GI: Soft, nontender, non-distended  MS: No  edema;  moves all extremities Integument: Skin feels  Neuro:  At time of evaluation, alert and oriented to person/place/time/situation  Psych: Normal affect, patient feels warm   ASSESSMENT:    1. Coronary artery disease of native artery of native heart with stable angina pectoris ([place])   2. Aortic atherosclerosis ([place])   3. Essential hypertension   4. OSA (obstructive sleep apnea)     PLAN:    Non-Obstructive CAD and Aortic Atherosclerosis HTN OSA on CPAP - continue ASA 81 mg PO Daily - no angina on present medications - BP controlled on norvasc 10 mg, doxazosin 2 mg PO Daily; valsartan and HCTZ - LDL at goal, 50, with rosuvastatin 20 mg PO Daily  - patient is on Omega 3 Fatty Acids  - CPET suggestive of deconditioning 9/22  Preoperative Risk Assessment - The Revised Cardiac Risk Index = 1 due to 1=0.9%: low estimated risk of perioperative myocardial infarction, pulmonary edema, ventricular fibrillation, cardiac arrest, or complete heart block.  - DASI score of 23 associated with 5.6 functional mets - No further cardiac testing is recommended prior to surgery.  - Our service is available as needed in the peri-operative period.   - can hold ASA as needed   One year me    Medication Adjustments/Labs and Tests Ordered: Current medicines are reviewed at length with the patient today.  Concerns regarding medicines are outlined above.  No orders of the defined types were placed in this encounter.    No orders of the defined types were placed in this encounter.     Patient Instructions  Medication Instructions:  Your physician recommends that you continue on your current medications as directed. Please refer to the Current Medication list given to you today.  *If you need a refill on your cardiac medications before your next appointment, please call your pharmacy*   Lab Work: NONE If you have labs (blood  work) drawn today and your tests are completely normal, you will receive your results only by: [place] (if you have MyChart) OR A paper copy in the mail If you have any lab test that is abnormal or we need to change your treatment, we will call you to review the results.   Testing/Procedures: NONE   Follow-Up: At [facility], you and your health needs are our priority.  As part of our continuing mission to provide you with exceptional heart care, we have created designated Provider Care Teams.  These Care Teams include your primary Cardiologist (physician) and Advanced Practice Providers (APPs -  Physician Assistants and Nurse Practitioners) who all work together to provide you with the care you need, when you need it.  We recommend signing up for the patient portal called "MyChart".  Sign up information is provided on this After Visit Summary.  MyChart is used to connect with patients for Virtual Visits (Telemedicine).  Patients are able to view lab/test results, encounter notes, upcoming appointments, etc.  Non-urgent messages can be sent to your provider as well.   To learn more about what you can do with MyChart, go to [email or web address].    Your next appointment:   1 year(s)  The format for your next appointment:   In Person  Provider:   Werner Lean, MD    Important Information About Sugar  Signed, Werner Lean, MD  01/17/2022 5:10 PM    [facility]

## 2022-01-23 ENCOUNTER — Telehealth: Payer: Self-pay

## 2022-01-23 NOTE — Telephone Encounter (Signed)
   Pre-operative Risk Assessment    Patient Name: Howard [name]  DOB: Apr 16, 1956 MRN: [id number]      Request for Surgical Clearance    Procedure:   LT TOTAL KNEE ARTHOPLASTY  Date of Surgery:  Clearance 04/24/22                                 Surgeon:  Rosanne Gutting Surgeon's Group or Practice Name:  DR. Gaynelle Arabian Phone number:  [phone] Fax number:  [phone]   Type of Clearance Requested:   - Pharmacy:  Hold Aspirin NEEDS INSTRUCTIONS    Type of Anesthesia:   CHOICE    Additional requests/questions:    SignedJacinta Shoe   01/23/2022, 5:25 PM

## 2022-01-24 NOTE — Telephone Encounter (Signed)
I am faxing Dr. Oralia Rud note of clearance from office visit 01/17/22.   Emmaline Life, NP-C    01/24/2022, 10:27 AM [place] [address] Office [phone] Fax [phone]

## 2022-03-26 ENCOUNTER — Telehealth (HOSPITAL_BASED_OUTPATIENT_CLINIC_OR_DEPARTMENT_OTHER): Payer: Self-pay | Admitting: Student in an Organized Health Care Education/Training Program

## 2022-03-26 NOTE — Telephone Encounter (Signed)
Patient called to schedule a knee aspiration. Scheduled for 10/16. He has a knee replacement scheduled for 04/24/2022 in [place].   Is a knee aspiration ok for patient?

## 2022-03-27 NOTE — Telephone Encounter (Signed)
Spoke to pt to ask him to get advise from ortho surgeon who is doing his knee replacement.   Pt said he spoke to surgeon ( Dr. Royetta Asal) and PA at [facility], [place], [place] [phone]. They confirmed that it is ok to have knee aspiration done 10/16. Pt reports swelling above kneecap, has difficulty bending knee. Michela Pitcher he has has it aspirated around 20 times.     Forward to Dr. Charyl Bigger for review.    Bradd Canary, RN, BSN  [place] at [facility]

## 2022-03-28 NOTE — Telephone Encounter (Signed)
Patient has appt already scheduled with [facility] for 10/16. Added to the note it will be for knee aspiration     [facility] / [facility] at [place] [id number]  [place]  [address]    EMAIL:  [email or web address] WEB: [email or web address]  OFFICE:    [phone] OPT 2   FAX: [phone]

## 2022-03-28 NOTE — Telephone Encounter (Addendum)
Received message from [facility] at Dr. Anne Fu office [phone].  Per surgeon's PA Theresa Duty, confirmed left knee aspiration will be OK prior to surgery.  Patient is available to come in earlier if available.     Thank you,    Miquel Dunn RN, BSN  [place] at [facility]

## 2022-04-02 ENCOUNTER — Encounter (HOSPITAL_BASED_OUTPATIENT_CLINIC_OR_DEPARTMENT_OTHER): Payer: Self-pay | Admitting: Student in an Organized Health Care Education/Training Program

## 2022-04-02 ENCOUNTER — Ambulatory Visit
Payer: Medicare PPO | Attending: Student in an Organized Health Care Education/Training Program | Admitting: Student in an Organized Health Care Education/Training Program

## 2022-04-02 VITALS — BP 158/80 | HR 77 | Temp 98.8°F

## 2022-04-02 DIAGNOSIS — M25462 Effusion, left knee: Secondary | ICD-10-CM | POA: Insufficient documentation

## 2022-04-02 DIAGNOSIS — M1712 Unilateral primary osteoarthritis, left knee: Secondary | ICD-10-CM

## 2022-04-02 NOTE — Patient Instructions (Signed)
You had a knee joint aspiration today. Please take it easy for the next few days and ice the area when needed.  Please keep the area covered with a bandage for 24 hours. Please do not submerge the area underwater for 48 hours (I.e. pool, hot tub, lake, bath). Showers are okay.    After 2-3 days, you can start to gradually build back into your normal activity level based on how you feel.    Infection after an aspiration is very, very rare. However, it can be very serious. If you notice any of the following, please call our office right away at [phone], option 2. If it is after business hours, you will be redirected to speak to a triage nurse.  (1) Fever or chills  (2) Red/hot/inflamed joint  (3) Drainage from the injection site  (4) Pain in the joint is worsening compared to pre-injection levels

## 2022-04-02 NOTE — Progress Notes (Signed)
The patient is a very pleasant 66 year old gentleman who is status post right TKA, and is awaiting left TKA for significant arthritis.  He normally lives in [place], but has been in [place] state for the past 4 weeks to help care for his 2 grandchildren who live here.  He has a TKA scheduled with his orthopedist back in [place] early next month.  His left knee has been very painful and swollen, and he is interested in having this drained today.  He has had it drained multiple times before back in [place].  He discussed this with his orthopedic surgeon, who was fine with [place] performing an aspiration as long as there was no injection coupled with this.    Physical exam today was notable for a large left knee effusion without warmth or erythema.  Range of motion from 0-110.  The left knee was nontender to palpation.  The patient ambulates with a slight limp.      Ultrasound-guided left knee joint aspiration:    Informed consent  The risks and benefits of the procedure were discussed. Potential benefits include decreased pain, reduced inflammation, and improved function. The risks include, but are not limited to, damage to surrounding structures, a small risk of bleeding, a reaction to the medicine, allergy, or infection.  The risk of fluid reaccummulating after the procedure was also discussed.  The patient was given an opportunity to ask questions. Verbal consent were obtained before the procedure. The patient agreed to proceed.    Procedure  Final verification of correct patient identity, procedure, site/side and instrument was performed prior to start of procedure: YES    The skin overlying the superolateral knee and surrounding areas was cleansed with chlorhexidine three times, and the ultrasound probe was also sterilely prepped. A small amount of 1% lidocaine was infiltrated in the overlying skin and along the planned aspiration and injection path using a 27-gauge needle, continual  ultrasound guidance, sterile ultrasound gel, and an in-plane approach. Then, using a 18-gauge needle, 165 cc of clear yellow to blood-tinged fluid was aspirated.  The needle was withdrawn, the area cleansed, and a sterile bandage placed. Good hemostasis was noted. The patient tolerated the procedure well. There were no complications.  He felt significantly better after the aspiration.    The patient and I discussed the benefits of sending the fluid for analysis, and he declined this today.  This is very reasonable given his benign physical exam, the normal appearance of his synovial fluid, and his prior history.    Aftercare instructions  Post-procedural care and return precautions, including infection warning signs, were reviewed with the patient prior to them leaving clinic. The patient verbalized understanding. All questions were answered.    Follow-up:  As needed.    --  Dub Amis, MD, CAQSM  Associate Professor  [place] Department of [facility] Medicine  Sports Medicine Section

## 2022-04-16 NOTE — Patient Instructions (Incomplete)

## 2022-04-16 NOTE — Progress Notes (Unsigned)
PATIENT: Howard [name] DOB: 1956/05/20  REASON FOR VISIT: follow up HISTORY FROM: patient  No chief complaint on file.    HISTORY OF PRESENT ILLNESS:  04/16/22 ALL: [name] returns for follow up for OSA on CPAP.    04/19/2021 ALL: [name] returns for follow up for OSA on CPAP. He continues to do very well. He is using CPAP nightly for at least 4 hours. He admits that he has a higher air leak. He can not tolerate mask tight on his face. He feels well rested and energized during the day. He does not wish to have mask refitting. He may consider travel CPAP as he has a son in [place] and another in [place].   Set up date 06/17/2019    11/09/2019 ALL:  ENRRIQUE [name] is a 66 y.o. male here today for follow up for OSA on CPAP. He was sent for mask refitting at last visit due to elevated leak.  He admits that although the DME company has reached out to him, he has not pursued a mask refitting.  He does continue to note a leak on his machine.  He has not used his machine to help identify correct mask seal.  He does continue to note improvement in sleep quality and daytime energy.  Compliance report dated 10/06/2019 through 11/04/2019 reveals that he used CPAP 29 of the past 30 days for compliance of 97%.  He used CPAP greater than 4 hours 25 of the past 30 days for compliance of 83%.  Average usage was 5 hours and 44 minutes.  Residual AHI was 2.8 on 8 to 18 cm of water and an EPR of 1.  There is a significant leak in the 95th percentile of 67.3 L/min.  HISTORY: (copied from my note on 08/10/2019)  Howard [name] is a 66 y.o. male here today for follow up for OSA on CPAP. Sleep study in 04/2019 revealed severe OSA with AHI of 77.2 and hypoxemia for 153 minutes. O2 nadir of 62%. Autopap ordered with large FFM and optional humidity with pressure settings of 8-18cmH20.  He has noted significant improvements in sleep quality and improvement in fatigue since starting CPAP therapy.    Compliance report dated 07/11/2019 through 08/09/2019 reveals that he used CPAP 29 of the last 30 days for compliance of 97%.  He used CPAP greater than 4 hours 29 of the last 30 days.  Average usage was 6 hours and 58 minutes.  Residual AHI was 4.7 on 8 to 18 cm of water and an EPR of 1.  There was a very high leak noted in the 95th percentile of 60.4 L/min.  He states that he has had a lot of difficulty with the air leaking around his mask.  He notes that he wakes in the morning with the bottom of the mask in his mouth.   HISTORY: (copied from Dr Edwena Felty note on 04/14/2019)   Howard [name] is a 66 y.o. year old Caucasian male patient seen on 04/14/2019 upon referral from Dr. Joylene Draft for a sleep consultation.  Chief concern according to patient : " I have a dental device but I am very sleepy"    I have the pleasure of seeing Howard [name] today, a right-handed Caucasian male with a possible sleep disorder. He  has a past medical history of Anxiety, Borderline diabetes, Coronary artery disease, Disorder of tendon of right biceps, Family history of adverse reaction to anesthesia, GERD (gastroesophageal reflux disease), Gout,  History of esophageal spasm (2008), History of non-melanoma ( basalioma)  skin cancer, History of nuclear stress test (02/17/2009), Hyperlipidemia, Hypertension, Hypogonadism male, Hypothyroidism, Nocturia, OA (osteoarthritis), OSA (obstructive sleep apnea), and Right rotator cuff tear.   The patient had the first sleep study in the year 2010 at [facility] - severe apnea) - and later a home study through his dentist,  with a result of an AHI ( Apnea Hypopnea index)  Indicating OSA. He has been using a dental device since , but feels he is sleepier again.   Sleep relevant medical history: former smoker, shift worker, gout and arthritis pain, neck and toe, shoulder rotator cuff injuries, knee replacement with pain.  Nocturia twice-  No Tonsillectomy, cervical  spine trauma, deviated septum repair. Family medical /sleep history: no other family member on CPAP with OSA, father was a snorer.     Social history: Howard [name] worked for [employer], Patient is retired from EMS/ stroke  and lives in a household with 2 persons. Family status is married with 2 adult sons, 76 and 72, and grandchildren. Both sons out of state.  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 Tobacco use: former , 25 years.  ETOH use: socially, 3/ week  Caffeine intake in form of Coffee( 3 cups  ) Soda( some ) Tea ( at outside dinner) or energy drinks. Regular exercise in form of walking/ in PT for the shoulder .     Sleep habits are as follows: The patient's dinner time is between 6- 6.30 PM. He watches Tv and often is asleep in the den in a recliner by 9.30/ The patient goes to bed at 11 PM and continues to sleep for several  hours, wakes for bathroom breaks, the first time at 2-3 AM and again 4-5 AM. .   The preferred sleep position is left side ( caveat is shoulder pain) , with the support of 1 pillow. Dreams are reportedly frequent , but not acting out. He has called out in his sleep- rarely. 7 AM is the usual rise time.  The patient wakes up spontaneously.  Gets 8 hours of sleep- but reports not feeling refreshed or restored in AM, with symptoms such as dry mouth, no morning headaches, and residual fatigue.  Naps are taken after lunch  infrequently, lasting from 30 to 45 minutes and are more refreshing than nocturnal sleep.   REVIEW OF SYSTEMS: Out of a complete 14 system review of symptoms, the patient complains only of the following symptoms, none and all other reviewed systems are negative.   ALLERGIES: Allergies  Allergen Reactions   Sulfa Antibiotics Rash    HOME MEDICATIONS: Outpatient Medications Prior to Visit  Medication Sig Dispense Refill   acetaminophen (TYLENOL) 650 MG CR tablet Take by mouth as needed.      albuterol (VENTOLIN HFA) 108 (90 Base) MCG/ACT inhaler Inhale into the lungs as needed.     allopurinol (ZYLOPRIM) 300 MG tablet Take 300 mg by mouth every morning.      amLODipine (NORVASC) 10 MG tablet Take 10 mg by mouth daily.     aspirin EC 81 MG tablet Take 81 mg by mouth daily.     buPROPion (WELLBUTRIN XL) 150 MG 24 hr tablet bupropion HCl XL 150 mg 24 hr tablet, extended release     celecoxib (CELEBREX) 200 MG capsule Take 200 mg by mouth daily.     Coenzyme Q10 (  CO Q-10) 200 MG CAPS daily.     colchicine 0.6 MG tablet as needed.     CRESTOR 20 MG tablet Take 10 mg by mouth daily.   0   doxazosin (CARDURA) 2 MG tablet Take 2 mg by mouth daily.     DULoxetine (CYMBALTA) 60 MG capsule Take 60 mg by mouth every morning.     famotidine (PEPCID) 20 MG tablet Take 20 mg by mouth daily.      fluticasone (FLONASE) 50 MCG/ACT nasal spray Place 2 sprays into both nostrils as needed.     levocetirizine (XYZAL) 5 MG tablet daily.     levothyroxine (SYNTHROID, LEVOTHROID) 175 MCG tablet Take 175 mcg by mouth every morning.      montelukast (SINGULAIR) 10 MG tablet Take 10 mg by mouth as needed for allergies.     Multiple Vitamin (MULTIVITAMIN) tablet Take 1 tablet by mouth daily.     Omega-3 Fatty Acids (FISH OIL) 1000 MG CAPS Take by mouth.     sildenafil (VIAGRA) 100 MG tablet Take 50 mg by mouth once as needed.      testosterone (ANDROGEL) 50 MG/5GM (1%) GEL Place 5 g onto the skin 2 (two) times daily.     valsartan-hydrochlorothiazide (DIOVAN-HCT) 320-25 MG tablet Take 1 tablet by mouth daily.     No facility-administered medications prior to visit.    PAST MEDICAL HISTORY: Past Medical History:  Diagnosis Date   Anxiety    Borderline diabetes    Coronary artery disease    cardiologist-  dr Marlou Porch--- coronary CT 09-11-2017 showed non-obstructive mild diffuse cad RCA and ostial LAD and moderate midLAD,     Disorder of tendon of right biceps    dislocation   Family history of  adverse reaction to anesthesia    FATHER REACTED TO LIDOCAINE    GERD (gastroesophageal reflux disease)    Gout    per pt stable   History of esophageal spasm 2008   History of nonmelanoma skin cancer    History of nuclear stress test 02/17/2009   normal , no ishemia w/ ef 60%   Hyperlipidemia    Hypertension    Hypogonadism male    followed by pcp   Hypothyroidism    followed by pcp   Nocturia    OA (osteoarthritis)    OSA (obstructive sleep apnea)    per pt has not used for severeal yrs, currently using mouth appliance   Right rotator cuff tear     PAST SURGICAL HISTORY: Past Surgical History:  Procedure Laterality Date   ANAL FISSURE REPAIR  yrs ago   CARPAL TUNNEL RELEASE  2012 /2013   bil   CATARACT EXTRACTION W/ INTRAOCULAR LENS  IMPLANT, BILATERAL  june 9 & 16-- 2020   INJECTION KNEE Left 12/29/2018   Procedure: LEFT KNEE ASPIRATION AND INJECTION;  Surgeon: Tania Ade, MD;  Location: [facility];  Service: Orthopedics;  Laterality: Left;   KNEE ARTHROSCOPY  12/12/2011   Procedure: ARTHROSCOPY KNEE;  Surgeon: Gearlean Alf, MD;  Location: [facility];  Service: Orthopedics;  Laterality: Left;  medial menisectomy and chondroplasty   KNEE ARTHROSCOPY Left 10/29/2012   Procedure: LEFT ARTHROSCOPY KNEE WITH CHONDROPLASTY AND DEBRIDMENT OF MENISCUS;  Surgeon: Gearlean Alf, MD;  Location: [facility];  Service: Orthopedics;  Laterality: Left;   KNEE SURGERY Bilateral right 1973 /  left 1985 /   right [place] Left 08-02-2008    dr Beola Cord  @  [facility]   SHOULDER ARTHROSCOPY WITH ROTATOR CUFF REPAIR Right 12/29/2018   Procedure: SHOULDER ARTHROSCOPY WITH ROTATOR CUFF REPAIR and  SUBACROMIAL DECOMPRESSION AND BICEP TENOTOMY;  Surgeon: Tania Ade, MD;  Location: [facility];  Service: Orthopedics;  Laterality: Right;   SHOULDER ARTHROSCOPY WITH ROTATOR CUFF REPAIR AND SUBACROMIAL DECOMPRESSION Right 04-09-2009   dr supple  '@[facility]'$     w/ labral debridement and dcr   TOTAL KNEE ARTHROPLASTY Right 07/05/2014   Procedure: RIGHT TOTAL KNEE ARTHROPLASTY;  Surgeon: Gearlean Alf, MD;  Location: [facility];  Service: Orthopedics;  Laterality: Right;    FAMILY HISTORY: Family History  Problem Relation Age of Onset   Heart disease Father    Hypertension Father     SOCIAL HISTORY: Social History   Socioeconomic History   Marital status: Married    Spouse name: Not on file   Number of children: Not on file   Years of education: Not on file   Highest education level: Not on file  Occupational History   Not on file  Tobacco Use   Smoking status: Former    Years: 30.00    Types: Cigarettes    Quit date: 12/05/2000    Years since quitting: 21.3   Smokeless tobacco: Never  Vaping Use   Vaping Use: Never used  Substance and Sexual Activity   Alcohol use: Yes    Comment: occassional   Drug use: Never   Sexual activity: Not on file  Other Topics Concern   Not on file  Social History Narrative   Not on file   Social Determinants of Health   Financial Resource Strain: Not on file  Food Insecurity: Not on file  Transportation Needs: Not on file  Physical Activity: Not on file  Stress: Not on file  Social Connections: Not on file  Intimate Partner Violence: Not on file      PHYSICAL EXAM  There were no vitals filed for this visit.   There is no height or weight on file to calculate BMI.  Generalized: Well developed, in no acute distress  Cardiology: normal rate and rhythm, no murmur noted Respiratory: clear to auscultation bilaterally  Neurological examination  Mentation: Alert oriented to time, place, history taking. Follows all commands speech and language fluent Cranial nerve II-XII: Pupils were equal round reactive to light. Extraocular movements were full Motor: The motor testing reveals 5 over 5 strength of all 4 extremities. Good symmetric motor tone is noted throughout.  Gait and station: Gait is  normal.   DIAGNOSTIC DATA (LABS, IMAGING, TESTING) - I reviewed patient records, labs, notes, testing and imaging myself where available.      No data to display           Lab Results  Component Value Date   WBC 4.1 04/18/2020   HGB 13.1 04/18/2020   HCT 38.5 (L) 04/18/2020   MCV 90.2 04/18/2020   PLT 190 04/18/2020      Component Value Date/Time   NA 135 12/05/2020 1001   K 3.8 12/05/2020 1001   CL 98 12/05/2020 1001   CO2 23 12/05/2020 1001   GLUCOSE 126 (H) 12/05/2020 1001   GLUCOSE 123 (H) 04/18/2020 2315   BUN 17 12/05/2020 1001   CREATININE 0.82 12/05/2020 1001   CALCIUM 9.4 12/05/2020 1001   PROT 7.7 06/29/2014 1125   ALBUMIN 4.4 06/29/2014 1125   AST 42 (H) 06/29/2014 1125   ALT 33 06/29/2014 1125   ALKPHOS 66 06/29/2014 1125  BILITOT 0.7 06/29/2014 1125   GFRNONAA >60 04/18/2020 2315   GFRAA 106 08/13/2017 1135   Lab Results  Component Value Date   CHOL 122 02/07/2010   HDL 39.70 02/07/2010   LDLCALC 67 02/07/2010   LDLDIRECT 180.9 11/19/2006   TRIG 79.0 02/07/2010   CHOLHDL 3 02/07/2010   Lab Results  Component Value Date   HGBA1C 6.3 02/07/2010   No results found for: "VITAMINB12" Lab Results  Component Value Date   TSH 3.82 02/07/2010       ASSESSMENT AND PLAN 66 y.o. year old male  has a past medical history of Anxiety, Borderline diabetes, Coronary artery disease, Disorder of tendon of right biceps, Family history of adverse reaction to anesthesia, GERD (gastroesophageal reflux disease), Gout, History of esophageal spasm (2008), History of nonmelanoma skin cancer, History of nuclear stress test (02/17/2009), Hyperlipidemia, Hypertension, Hypogonadism male, Hypothyroidism, Nocturia, OA (osteoarthritis), OSA (obstructive sleep apnea), and Right rotator cuff tear. here with   No diagnosis found.    Torre continues to note benefit with CPAP therapy. Compliance report reveals optimal compliance.  Unfortunately, concerns of a large leak  continue. He is happy with his full facemask and does not wish to try anything new.  He would like to continue working on tightening his headgear at home.  AHI is slightly elevated at 5.8/hr. I will recheck download in 8-10 weeks to ensure  adequate control. He was encouraged to continue using CPAP nightly and for greater than 4 hours each night.  He will follow up with me in 1 year, sooner if needed.  He verbalizes understanding and agreement with this plan.   No orders of the defined types were placed in this encounter.     No orders of the defined types were placed in this encounter.      Debbora Presto, FNP-C 04/16/2022, 12:41 PM [facility] [address] [place], [place] [address] [phone]

## 2022-04-18 HISTORY — PX: REPLACEMENT TOTAL HIP W/  RESURFACING IMPLANTS: SUR1222

## 2022-04-19 ENCOUNTER — Other Ambulatory Visit: Payer: Self-pay | Admitting: Internal Medicine

## 2022-04-19 ENCOUNTER — Ambulatory Visit (INDEPENDENT_AMBULATORY_CARE_PROVIDER_SITE_OTHER): Payer: Medicare Other | Admitting: Family Medicine

## 2022-04-19 ENCOUNTER — Encounter: Payer: Self-pay | Admitting: Family Medicine

## 2022-04-19 VITALS — BP 142/78 | HR 69 | Ht 74.0 in | Wt 258.0 lb

## 2022-04-19 DIAGNOSIS — G4733 Obstructive sleep apnea (adult) (pediatric): Secondary | ICD-10-CM

## 2022-04-19 DIAGNOSIS — Z720 Tobacco use: Secondary | ICD-10-CM

## 2022-04-23 ENCOUNTER — Telehealth: Payer: Self-pay

## 2022-06-04 ENCOUNTER — Ambulatory Visit
Admission: RE | Admit: 2022-06-04 | Discharge: 2022-06-04 | Disposition: A | Discharge status: Home / Self Care | Payer: Medicare Other | Source: Ambulatory Visit | Attending: Internal Medicine | Admitting: Internal Medicine

## 2022-06-04 DIAGNOSIS — Z720 Tobacco use: Secondary | ICD-10-CM

## 2022-07-19 ENCOUNTER — Telehealth: Payer: Self-pay | Admitting: *Deleted

## 2022-07-19 NOTE — Telephone Encounter (Signed)
   Pre-operative Risk Assessment    Patient Name: Howard [name]  DOB: [birth year] MRN: [id number]      Request for Surgical Clearance    Procedure:   RIGHT TOTAL HIP ARTHROPLASTY  Date of Surgery:  Clearance 10/09/22                                 Surgeon:  DR. Gaynelle Arabian Surgeon's Group or Practice Name:  [facility] Phone number:  [phone]; [name] Fax number:  [phone]   Type of Clearance Requested:   - Medical ; ASA    Type of Anesthesia:   CHOICE   Additional requests/questions:    [name]   07/19/2022, 1:57 PM

## 2022-07-19 NOTE — Telephone Encounter (Signed)
Please arrange virtual telephone visit with preop APP

## 2022-07-20 NOTE — Telephone Encounter (Signed)
Left message to call back for tele pre op appt 

## 2022-07-24 ENCOUNTER — Telehealth: Payer: Self-pay

## 2022-07-24 NOTE — Telephone Encounter (Signed)
  Patient Consent for Virtual Visit         Howard [name] has provided verbal consent on 07/24/2022 for a virtual visit (video or telephone).   CONSENT FOR VIRTUAL VISIT FOR:  Howard [name]  By participating in this virtual visit I agree to the following:  I hereby voluntarily request, consent and authorize [facility] and its employed or contracted physicians, physician assistants, nurse practitioners or other licensed health care professionals (the Practitioner), to provide me with telemedicine health care services (the "Services") as deemed necessary by the treating Practitioner. I acknowledge and consent to receive the Services by the Practitioner via telemedicine. I understand that the telemedicine visit will involve communicating with the Practitioner through live audiovisual communication technology and the disclosure of certain medical information by electronic transmission. I acknowledge that I have been given the opportunity to request an in-person assessment or other available alternative prior to the telemedicine visit and am voluntarily participating in the telemedicine visit.  I understand that I have the right to withhold or withdraw my consent to the use of telemedicine in the course of my care at any time, without affecting my right to future care or treatment, and that the Practitioner or I may terminate the telemedicine visit at any time. I understand that I have the right to inspect all information obtained and/or recorded in the course of the telemedicine visit and may receive copies of available information for a reasonable fee.  I understand that some of the potential risks of receiving the Services via telemedicine include:  Delay or interruption in medical evaluation due to technological equipment failure or disruption; Information transmitted may not be sufficient (e.g. poor resolution of images) to allow for appropriate medical decision making by the  Practitioner; and/or  In rare instances, security protocols could fail, causing a breach of personal health information.  Furthermore, I acknowledge that it is my responsibility to provide information about my medical history, conditions and care that is complete and accurate to the best of my ability. I acknowledge that Practitioner's advice, recommendations, and/or decision may be based on factors not within their control, such as incomplete or inaccurate data provided by me or distortions of diagnostic images or specimens that may result from electronic transmissions. I understand that the practice of medicine is not an exact science and that Practitioner makes no warranties or guarantees regarding treatment outcomes. I acknowledge that a copy of this consent can be made available to me via my patient portal ([facility]), or I can request a printed copy by calling the office of [facility].    I understand that my insurance will be billed for this visit.   I have read or had this consent read to me. I understand the contents of this consent, which adequately explains the benefits and risks of the Services being provided via telemedicine.  I have been provided ample opportunity to ask questions regarding this consent and the Services and have had my questions answered to my satisfaction. I give my informed consent for the services to be provided through the use of telemedicine in my medical care

## 2022-07-24 NOTE — Telephone Encounter (Signed)
Pt is returning call and is requesting return call.

## 2022-07-24 NOTE — Telephone Encounter (Signed)
Patient agreeable with telehealth appt. Consent given and med list reviewed.

## 2022-07-31 ENCOUNTER — Telehealth: Payer: Self-pay | Admitting: Internal Medicine

## 2022-07-31 NOTE — Telephone Encounter (Signed)
Pt would like a callback regarding Clearance appt. He would like to know if appt can be moved up being that procedure maybe sooner. Please advise

## 2022-07-31 NOTE — Telephone Encounter (Signed)
I s/w the pt and he asked for a sooner tele pre op appt as his surgery may be moved up sooner. I said I cannot go out least from the date we have now of 10/09/22, pt said that is ok. We scheduled 08/10/22 which will still be in our 2 month protocol for pre op. Pt said thank you for the help. I assured the pt that I will update the surgeon office as well of sooner tele pre op appt.

## 2022-08-07 NOTE — Telephone Encounter (Signed)
Patient calling requesting a sooner telehealth appt this week.

## 2022-08-07 NOTE — Telephone Encounter (Signed)
Returned call to pt.   He advised that his surgery has been moved up to the middle of March and requested a sooner tele visit. Pt was advised, that appt 08/10/22 is still plenty of time for his clearance to be taken care of and that we really couldn't add anyone to a "full" schedule unless it was an emergency.  Pt did thank me for the help.

## 2022-08-10 ENCOUNTER — Ambulatory Visit: Payer: Medicare Other | Attending: Cardiovascular Disease | Admitting: Student

## 2022-08-10 DIAGNOSIS — Z01810 Encounter for preprocedural cardiovascular examination: Secondary | ICD-10-CM | POA: Diagnosis not present

## 2022-08-10 NOTE — Progress Notes (Signed)
Virtual Visit via Telephone Note   Because of Howard [name]'s co-morbid illnesses, he is at least at moderate risk for complications without adequate follow up.  This format is felt to be most appropriate for this patient at this time.  The patient did not have access to video technology/had technical difficulties with video requiring transitioning to audio format only (telephone).  All issues noted in this document were discussed and addressed.  No physical exam could be performed with this format.  Please refer to the patient's chart for his consent to telehealth for Howard [name].  Evaluation Performed:  Preoperative cardiovascular risk assessment _____________   Date:  08/10/2022   Patient ID:  Howard [name], DOB 11-10-1955, MRN [id number] Patient Location:  Home Provider location:   Office  Primary Care Provider:  Crist Infante, MD Primary Cardiologist:  Werner Lean, MD  Chief Complaint / Patient Profile   67 y.o. y/o male with a h/o nonobstructive CAD, hypertension, OSA on CPAP who is pending right total hip arthoplasty by Dr. Wynelle Link and presents today for telephonic preoperative cardiovascular risk assessment.  History of Present Illness    Howard [name] is a 67 y.o. male who presents via audio/video conferencing for a telehealth visit today.  Pt was last seen in cardiology clinic on 01/17/2022 by Dr. Gasper Sells.  At that time Howard [name] was doing well.  The patient is now pending procedure as outlined above. Since his last visit, he is doing well from a cardiac standpoint. Patient denies shortness of breath or dyspnea on exertion. No chest pain, pressure, or tightness. Denies lower extremity edema, orthopnea, or PND. No palpitations. His activity is very limited secondary to hip pain. Per [facility] he is able to achieve 5.62 METS.   Past Medical History    Past Medical History:  Diagnosis Date   Anxiety    Borderline diabetes     Coronary artery disease    cardiologist-  dr Marlou Porch--- coronary CT 09-11-2017 showed non-obstructive mild diffuse cad RCA and ostial LAD and moderate midLAD,     Disorder of tendon of right biceps    dislocation   Family history of adverse reaction to anesthesia    FATHER REACTED TO LIDOCAINE    GERD (gastroesophageal reflux disease)    Gout    per pt stable   History of esophageal spasm 2008   History of nonmelanoma skin cancer    History of nuclear stress test 02/17/2009   normal , no ishemia w/ ef 60%   Hyperlipidemia    Hypertension    Hypogonadism male    followed by pcp   Hypothyroidism    followed by pcp   Nocturia    OA (osteoarthritis)    OSA (obstructive sleep apnea)    per pt has not used for severeal yrs, currently using mouth appliance   Right rotator cuff tear    Past Surgical History:  Procedure Laterality Date   ANAL FISSURE REPAIR  yrs ago   CARPAL TUNNEL RELEASE  2012 /2013   bil   CATARACT EXTRACTION W/ INTRAOCULAR LENS  IMPLANT, BILATERAL  june 9 & 16-- 2020   INJECTION KNEE Left 12/29/2018   Procedure: LEFT KNEE ASPIRATION AND INJECTION;  Surgeon: Tania Ade, MD;  Location: [facility];  Service: Orthopedics;  Laterality: Left;   KNEE ARTHROSCOPY  12/12/2011   Procedure: ARTHROSCOPY KNEE;  Surgeon: Gearlean Alf, MD;  Location: [facility];  Service: Orthopedics;  Laterality: Left;  medial menisectomy and chondroplasty   KNEE ARTHROSCOPY Left 10/29/2012   Procedure: LEFT ARTHROSCOPY KNEE WITH CHONDROPLASTY AND DEBRIDMENT OF MENISCUS;  Surgeon: Gearlean Alf, MD;  Location: [facility];  Service: Orthopedics;  Laterality: Left;   KNEE SURGERY Bilateral right 1973 /  left 1985 /   right 1993   PLANTAR FASCIA SURGERY Left 08-02-2008    dr Beola Cord  '@[facility]'$    SHOULDER ARTHROSCOPY WITH ROTATOR CUFF REPAIR Right 12/29/2018   Procedure: SHOULDER ARTHROSCOPY WITH ROTATOR CUFF REPAIR and  SUBACROMIAL DECOMPRESSION AND BICEP TENOTOMY;  Surgeon: Tania Ade, MD;  Location: [facility];  Service: Orthopedics;  Laterality: Right;   SHOULDER ARTHROSCOPY WITH ROTATOR CUFF REPAIR AND SUBACROMIAL DECOMPRESSION Right 04-09-2009   dr supple  '@[facility]'$    w/ labral debridement and dcr   TOTAL KNEE ARTHROPLASTY Right 07/05/2014   Procedure: RIGHT TOTAL KNEE ARTHROPLASTY;  Surgeon: Gearlean Alf, MD;  Location[facility];  Service: Orthopedics;  Laterality: Right;    Allergies  Allergies  Allergen Reactions   Sulfa Antibiotics Rash    Home Medications    Prior to Admission medications   Medication Sig Start Date End Date Taking? Authorizing Provider  acetaminophen (TYLENOL) 650 MG CR tablet Take by mouth as needed.    [provider]  allopurinol (ZYLOPRIM) 300 MG tablet Take 300 mg by mouth every morning.     [provider]  amLODipine (NORVASC) 10 MG tablet Take 10 mg by mouth daily. 02/19/19   [provider]  aspirin EC 81 MG tablet Take 81 mg by mouth daily.    [provider]  buPROPion (WELLBUTRIN XL) 150 MG 24 hr tablet bupropion HCl XL 150 mg 24 hr tablet, extended release 07/24/17   [provider]  celecoxib (CELEBREX) 200 MG capsule Take 200 mg by mouth daily. 06/28/19   [provider]  Coenzyme Q10 (CO Q-10) 200 MG CAPS Take 1 tablet by mouth daily. 07/27/10   [provider]  colchicine 0.6 MG tablet as needed. 11/28/10   [provider]  COLLAGEN-VITAMIN C-BIOTIN PO Take 6,000 mg by mouth daily.    [provider]  CRESTOR 20 MG tablet Take 10 mg by mouth daily.  05/19/14   [provider]  doxazosin (CARDURA) 2 MG tablet Take 2 mg by mouth daily. 08/17/19   [provider]  DULoxetine (CYMBALTA) 60 MG capsule Take 60 mg by mouth every morning.    [provider]  famotidine (PEPCID) 20 MG tablet Take 20 mg by mouth daily.     [provider]  levocetirizine (XYZAL) 5 MG tablet Take 2.5 mg by mouth daily. 07/04/17    [provider]  levothyroxine (SYNTHROID, LEVOTHROID) 175 MCG tablet Take 175 mcg by mouth every morning.     [provider]  montelukast (SINGULAIR) 10 MG tablet Take 10 mg by mouth as needed for allergies. 11/14/21   [provider]  Multiple Vitamin (MULTIVITAMIN) tablet Take 1 tablet by mouth daily.    [provider]  Omega-3 Fatty Acids (FISH OIL) 1000 MG CAPS Take by mouth.    [provider]  sildenafil (VIAGRA) 100 MG tablet Take 50 mg by mouth once as needed.  12/24/19   [provider]  testosterone (ANDROGEL) 50 MG/5GM (1%) GEL Place 5 g onto the skin 2 (two) times daily.    [provider]  Turmeric (QC TUMERIC COMPLEX PO) Take 1,000 mg by mouth daily.  [provider]  valsartan-hydrochlorothiazide (DIOVAN-HCT) 320-25 MG tablet Take 1 tablet by mouth daily. 09/25/20   [provider]    Physical Exam    Vital Signs:  Howard [name] does not have vital signs available for review today.  Given telephonic nature of communication, physical exam is limited. AAOx3. NAD. Normal affect.  Speech and respirations are unlabored.  Accessory Clinical Findings    None  Assessment & Plan    Primary Cardiologist: Werner Lean, MD  Preoperative cardiovascular risk assessment. Right total hip arthroplasty.   Chart reviewed as part of pre-operative protocol coverage. According to the RCRI, patient has a 0.4% risk of MACE. Patient reports activity equivalent to 5.62 METS (walking in the grocery store/carrying in groceries, moderate household chores, light yard cleanup).   Given past medical history and time since last visit, based on ACC/AHA guidelines, Howard [name] would be at acceptable risk for the planned procedure without further cardiovascular testing.   Patient was advised that if he develops new symptoms prior to surgery to contact our office to arrange a follow-up appointment.   he verbalized understanding.  Ideally aspirin should be continued without interruption, however if the bleeding risk is too great, aspirin may be held for 7 days prior to surgery. Please resume aspirin post operatively when it is felt to be safe from a bleeding standpoint.  I will route this recommendation to the requesting party via Epic fax function.  Please call with questions.  Time:   Today, I have spent 6 minutes with the patient with telehealth technology discussing medical history, symptoms, and management plan.     Mayra Reel, NP  08/10/2022, 8:07 AM

## 2022-08-17 ENCOUNTER — Telehealth: Payer: Medicare Other

## 2022-10-17 HISTORY — PX: REPLACEMENT TOTAL KNEE: SUR1224

## 2022-12-27 DIAGNOSIS — Z96652 Presence of left artificial knee joint: Secondary | ICD-10-CM | POA: Insufficient documentation

## 2022-12-27 DIAGNOSIS — Z96641 Presence of right artificial hip joint: Secondary | ICD-10-CM | POA: Insufficient documentation

## 2023-01-21 DIAGNOSIS — M2040 Other hammer toe(s) (acquired), unspecified foot: Secondary | ICD-10-CM | POA: Insufficient documentation

## 2023-01-21 DIAGNOSIS — M2010 Hallux valgus (acquired), unspecified foot: Secondary | ICD-10-CM | POA: Insufficient documentation

## 2023-04-05 ENCOUNTER — Ambulatory Visit: Payer: Medicare Other | Admitting: Internal Medicine

## 2023-04-22 NOTE — Patient Instructions (Signed)

## 2023-04-22 NOTE — Progress Notes (Unsigned)
PATIENT: Howard [name] DOB: 1955/08/06  REASON FOR VISIT: follow up HISTORY FROM: patient  No chief complaint on file.    HISTORY OF PRESENT ILLNESS:  04/22/23 ALL: Howard [name] returns for follow up for OSA on CPAP. He continues to do well on therapy.   04/19/2022 ALL:  Howard [name] returns for follow up for OSA on CPAP. He is doing well on therapy. He is using CPAP most every night for about 6hr. He has had trouble with knee pain which has interrupted sleep. He is planning to have a left knee replacement next week. He is also having right hip and low back pain, likely due to compensation. He is followed closely by ortho. He continues to monitor for leak at home. He is unable to tolerate mask any tighter.     04/19/2021 ALL: Howard [name] returns for follow up for OSA on CPAP. He continues to do very well. He is using CPAP nightly for at least 4 hours. He admits that he has a higher air leak. He can not tolerate mask tight on his face. He feels well rested and energized during the day. He does not wish to have mask refitting. He may consider travel CPAP as he has a son in [place] and another in [place].   Set up date 06/17/2019    11/09/2019 ALL:  Howard [name] is a 67 y.o. male here today for follow up for OSA on CPAP. He was sent for mask refitting at last visit due to elevated leak.  He admits that although the DME company has reached out to him, he has not pursued a mask refitting.  He does continue to note a leak on his machine.  He has not used his machine to help identify correct mask seal.  He does continue to note improvement in sleep quality and daytime energy.  Compliance report dated 10/06/2019 through 11/04/2019 reveals that he used CPAP 29 of the past 30 days for compliance of 97%.  He used CPAP greater than 4 hours 25 of the past 30 days for compliance of 83%.  Average usage was 5 hours and 44 minutes.  Residual AHI was 2.8 on 8 to 18 cm of water and an EPR of 1.  There is a  significant leak in the 95th percentile of 67.3 L/min.  HISTORY: (copied from my note on 08/10/2019)  Howard STROOT is a 67 y.o. male here today for follow up for OSA on CPAP. Sleep study in 04/2019 revealed severe OSA with AHI of 77.2 and hypoxemia for 153 minutes. O2 nadir of 62%. Autopap ordered with large FFM and optional humidity with pressure settings of 8-18cmH20.  He has noted significant improvements in sleep quality and improvement in fatigue since starting CPAP therapy.   Compliance report dated 07/11/2019 through 08/09/2019 reveals that he used CPAP 29 of the last 30 days for compliance of 97%.  He used CPAP greater than 4 hours 29 of the last 30 days.  Average usage was 6 hours and 58 minutes.  Residual AHI was 4.7 on 8 to 18 cm of water and an EPR of 1.  There was a very high leak noted in the 95th percentile of 60.4 L/min.  He states that he has had a lot of difficulty with the air leaking around his mask.  He notes that he wakes in the morning with the bottom of the mask in his mouth.   HISTORY: (copied from Dr Dohmeier's note on 04/14/2019)  Howard [name] is a 67 y.o. year old Caucasian male patient seen on 04/14/2019 upon referral from Dr. Waynard Edwards for a sleep consultation.  Chief concern according to patient : " I have a dental device but I am very sleepy"    I have the pleasure of seeing [name] today, a right-handed Caucasian male with a possible sleep disorder. He  has a past medical history of Anxiety, Borderline diabetes, Coronary artery disease, Disorder of tendon of right biceps, Family history of adverse reaction to anesthesia, GERD (gastroesophageal reflux disease), Gout, History of esophageal spasm (2008), History of non-melanoma ( basalioma)  skin cancer, History of nuclear stress test (02/17/2009), Hyperlipidemia, Hypertension, Hypogonadism male, Hypothyroidism, Nocturia, OA (osteoarthritis), OSA (obstructive sleep apnea), and Right rotator cuff tear.    The patient had the first sleep study in the year 2010 at [facility] - severe apnea) - and later a home study through his dentist,  with a result of an AHI ( Apnea Hypopnea index)  Indicating OSA. He has been using a dental device since , but feels he is sleepier again.   Sleep relevant medical history: former smoker, shift worker, gout and arthritis pain, neck and toe, shoulder rotator cuff injuries, knee replacement with pain.  Nocturia twice-  No Tonsillectomy, cervical spine trauma, deviated septum repair. Family medical /sleep history: no other family member on CPAP with OSA, father was a snorer.     Social history: [name] worked for [employer], Patient is retired from EMS/ stroke  and lives in a household with 2 persons. Family status is married with 2 adult sons, 45 and 64, and grandchildren. Both sons out of state.  The patient currently works/ used to work in shifts( Chief Technology Officer,) Pets are present- 2 cats. Tobacco use: former , 25 years.  ETOH use: socially, 3/ week  Caffeine intake in form of Coffee( 3 cups  ) Soda( some ) Tea ( at outside dinner) or energy drinks. Regular exercise in form of walking/ in PT for the shoulder .     Sleep habits are as follows: The patient's dinner time is between 6- 6.30 PM. He watches Tv and often is asleep in the den in a recliner by 9.30/ The patient goes to bed at 11 PM and continues to sleep for several  hours, wakes for bathroom breaks, the first time at 2-3 AM and again 4-5 AM. .   The preferred sleep position is left side ( caveat is shoulder pain) , with the support of 1 pillow. Dreams are reportedly frequent , but not acting out. He has called out in his sleep- rarely. 7 AM is the usual rise time.  The patient wakes up spontaneously.  Gets 8 hours of sleep- but reports not feeling refreshed or restored in AM, with symptoms such as dry mouth, no morning headaches, and residual fatigue.  Naps are taken after lunch   infrequently, lasting from 30 to 45 minutes and are more refreshing than nocturnal sleep.   REVIEW OF SYSTEMS: Out of a complete 14 system review of symptoms, the patient complains only of the following symptoms, left knee pain,  right hip pain and all other reviewed systems are negative.  ESS: 15/24  ALLERGIES: Allergies  Allergen Reactions   Sulfa Antibiotics Rash    HOME MEDICATIONS: Outpatient Medications Prior to Visit  Medication Sig Dispense Refill   acetaminophen (TYLENOL) 650 MG CR tablet Take by mouth as needed.  allopurinol (ZYLOPRIM) 300 MG tablet Take 300 mg by mouth every morning.      amLODipine (NORVASC) 10 MG tablet Take 10 mg by mouth daily.     aspirin EC 81 MG tablet Take 81 mg by mouth daily.     buPROPion (WELLBUTRIN XL) 150 MG 24 hr tablet bupropion HCl XL 150 mg 24 hr tablet, extended release     celecoxib (CELEBREX) 200 MG capsule Take 200 mg by mouth daily.     Coenzyme Q10 (CO Q-10) 200 MG CAPS Take 1 tablet by mouth daily.     colchicine 0.6 MG tablet as needed.     COLLAGEN-VITAMIN C-BIOTIN PO Take 6,000 mg by mouth daily.     CRESTOR 20 MG tablet Take 10 mg by mouth daily.   0   doxazosin (CARDURA) 2 MG tablet Take 2 mg by mouth daily.     DULoxetine (CYMBALTA) 60 MG capsule Take 60 mg by mouth every morning.     famotidine (PEPCID) 20 MG tablet Take 20 mg by mouth daily.      levocetirizine (XYZAL) 5 MG tablet Take 2.5 mg by mouth daily.     levothyroxine (SYNTHROID, LEVOTHROID) 175 MCG tablet Take 175 mcg by mouth every morning.      montelukast (SINGULAIR) 10 MG tablet Take 10 mg by mouth as needed for allergies.     Multiple Vitamin (MULTIVITAMIN) tablet Take 1 tablet by mouth daily.     Omega-3 Fatty Acids (FISH OIL) 1000 MG CAPS Take by mouth.     sildenafil (VIAGRA) 100 MG tablet Take 50 mg by mouth once as needed.      testosterone (ANDROGEL) 50 MG/5GM (1%) GEL Place 5 g onto the skin 2 (two) times daily.     Turmeric (QC TUMERIC COMPLEX  PO) Take 1,000 mg by mouth daily.     valsartan-hydrochlorothiazide (DIOVAN-HCT) 320-25 MG tablet Take 1 tablet by mouth daily.     No facility-administered medications prior to visit.    PAST MEDICAL HISTORY: Past Medical History:  Diagnosis Date   Anxiety    Borderline diabetes    Coronary artery disease    cardiologist-  dr Anne Fu--- coronary CT 09-11-2017 showed non-obstructive mild diffuse cad RCA and ostial LAD and moderate midLAD,     Disorder of tendon of right biceps    dislocation   Family history of adverse reaction to anesthesia    FATHER REACTED TO LIDOCAINE    GERD (gastroesophageal reflux disease)    Gout    per pt stable   History of esophageal spasm 2008   History of nonmelanoma skin cancer    History of nuclear stress test 02/17/2009   normal , no ishemia w/ ef 60%   Hyperlipidemia    Hypertension    Hypogonadism male    followed by pcp   Hypothyroidism    followed by pcp   Nocturia    OA (osteoarthritis)    OSA (obstructive sleep apnea)    per pt has not used for severeal yrs, currently using mouth appliance   Right rotator cuff tear     PAST SURGICAL HISTORY: Past Surgical History:  Procedure Laterality Date   ANAL FISSURE REPAIR  yrs ago   CARPAL TUNNEL RELEASE  2012 /2013   bil   CATARACT EXTRACTION W/ INTRAOCULAR LENS  IMPLANT, BILATERAL  june 9 & 16-- 2020   INJECTION KNEE Left 12/29/2018   Procedure: LEFT KNEE ASPIRATION AND INJECTION;  Surgeon: Jones Broom, MD;  Location:  [facility];  Service: Orthopedics;  Laterality: Left;   KNEE ARTHROSCOPY  12/12/2011   Procedure: ARTHROSCOPY KNEE;  Surgeon: Loanne Drilling, MD;  Location: [facility];  Service: Orthopedics;  Laterality: Left;  medial menisectomy and chondroplasty   KNEE ARTHROSCOPY Left 10/29/2012   Procedure: LEFT ARTHROSCOPY KNEE WITH CHONDROPLASTY AND DEBRIDMENT OF MENISCUS;  Surgeon: Loanne Drilling, MD;  Location: [facility];  Service: Orthopedics;  Laterality: Left;    KNEE SURGERY Bilateral right 1973 /  left 1985 /   right 1993   PLANTAR FASCIA SURGERY Left 08-02-2008    dr Lestine Box  @[facility]    SHOULDER ARTHROSCOPY WITH ROTATOR CUFF REPAIR Right 12/29/2018   Procedure: SHOULDER ARTHROSCOPY WITH ROTATOR CUFF REPAIR and  SUBACROMIAL DECOMPRESSION AND BICEP TENOTOMY;  Surgeon: Jones Broom, MD;  Location: [facility] ;  Service: Orthopedics;  Laterality: Right;   SHOULDER ARTHROSCOPY WITH ROTATOR CUFF REPAIR AND SUBACROMIAL DECOMPRESSION Right 04-09-2009   dr supple  @[facility]    w/ labral debridement and dcr   TOTAL KNEE ARTHROPLASTY Right 07/05/2014   Procedure: RIGHT TOTAL KNEE ARTHROPLASTY;  Surgeon: Loanne Drilling, MD;  Location: [facility];  Service: Orthopedics;  Laterality: Right;    FAMILY HISTORY: Family History  Problem Relation Age of Onset   Heart disease Father    Hypertension Father     SOCIAL HISTORY: Social History   Socioeconomic History   Marital status: Married    Spouse name: Not on file   Number of children: Not on file   Years of education: Not on file   Highest education level: Not on file  Occupational History   Not on file  Tobacco Use   Smoking status: Former    Current packs/day: 0.00    Types: Cigarettes    Start date: 12/06/1970    Quit date: 12/05/2000    Years since quitting: 22.3   Smokeless tobacco: Never  Vaping Use   Vaping status: Never Used  Substance and Sexual Activity   Alcohol use: Yes    Comment: occassional   Drug use: Never   Sexual activity: Not on file  Other Topics Concern   Not on file  Social History Narrative   Not on file   Social Determinants of Health   Financial Resource Strain: Not on file  Food Insecurity: Not on file  Transportation Needs: Not on file  Physical Activity: Not on file  Stress: Not on file  Social Connections: Not on file  Intimate Partner Violence: Not on file      PHYSICAL EXAM  There were no vitals filed for this visit.    There is no  height or weight on file to calculate BMI.  Generalized: Well developed, in no acute distress  Cardiology: normal rate and rhythm, no murmur noted Respiratory: clear to auscultation bilaterally  Neurological examination  Mentation: Alert oriented to time, place, history taking. Follows all commands speech and language fluent Cranial nerve II-XII: Pupils were equal round reactive to light. Extraocular movements were full Motor: The motor testing reveals 5 over 5 strength of all 4 extremities. Good symmetric motor tone is noted throughout.  Gait and station: Gait is normal.   DIAGNOSTIC DATA (LABS, IMAGING, TESTING) - I reviewed patient records, labs, notes, testing and imaging myself where available.      No data to display           Lab Results  Component Value Date   WBC 4.1 04/18/2020   HGB  13.1 04/18/2020   HCT 38.5 (L) 04/18/2020   MCV 90.2 04/18/2020   PLT 190 04/18/2020      Component Value Date/Time   NA 135 12/05/2020 1001   K 3.8 12/05/2020 1001   CL 98 12/05/2020 1001   CO2 23 12/05/2020 1001   GLUCOSE 126 (H) 12/05/2020 1001   GLUCOSE 123 (H) 04/18/2020 2315   BUN 17 12/05/2020 1001   CREATININE 0.82 12/05/2020 1001   CALCIUM 9.4 12/05/2020 1001   PROT 7.7 06/29/2014 1125   ALBUMIN 4.4 06/29/2014 1125   AST 42 (H) 06/29/2014 1125   ALT 33 06/29/2014 1125   ALKPHOS 66 06/29/2014 1125   BILITOT 0.7 06/29/2014 1125   GFRNONAA >60 04/18/2020 2315   GFRAA 106 08/13/2017 1135   Lab Results  Component Value Date   CHOL 122 02/07/2010   HDL 39.70 02/07/2010   LDLCALC 67 02/07/2010   LDLDIRECT 180.9 11/19/2006   TRIG 79.0 02/07/2010   CHOLHDL 3 02/07/2010   Lab Results  Component Value Date   HGBA1C 6.3 02/07/2010   No results found for: "VITAMINB12" Lab Results  Component Value Date   TSH 3.82 02/07/2010       ASSESSMENT AND PLAN 67 y.o. year old male  has a past medical history of Anxiety, Borderline diabetes, Coronary artery disease,  Disorder of tendon of right biceps, Family history of adverse reaction to anesthesia, GERD (gastroesophageal reflux disease), Gout, History of esophageal spasm (2008), History of nonmelanoma skin cancer, History of nuclear stress test (02/17/2009), Hyperlipidemia, Hypertension, Hypogonadism male, Hypothyroidism, Nocturia, OA (osteoarthritis), OSA (obstructive sleep apnea), and Right rotator cuff tear. here with   No diagnosis found.   Leevon continues to note benefit with CPAP therapy. Compliance report reveals optimal compliance. Leak remains elevated. He is happy with his full facemask and does not wish to try anything new.  He would like to continue working on tightening his headgear at home.  AHI is slightly elevated but stable at 5.8/hr. He was encouraged to continue using CPAP nightly and for greater than 4 hours each night.  He will follow up with me in 1 year, sooner if needed.  He verbalizes understanding and agreement with this plan.   No orders of the defined types were placed in this encounter.     No orders of the defined types were placed in this encounter.      Shawnie Dapper, FNP-C 04/22/2023, 4:36 PM [facility] [address] [place], [place] [address] [phone]

## 2023-04-25 ENCOUNTER — Ambulatory Visit (INDEPENDENT_AMBULATORY_CARE_PROVIDER_SITE_OTHER): Payer: Medicare Other | Admitting: Family Medicine

## 2023-04-25 ENCOUNTER — Encounter: Payer: Self-pay | Admitting: Family Medicine

## 2023-04-25 VITALS — BP 122/65 | HR 64 | Ht 74.0 in | Wt 258.0 lb

## 2023-04-25 DIAGNOSIS — G4733 Obstructive sleep apnea (adult) (pediatric): Secondary | ICD-10-CM | POA: Diagnosis not present

## 2023-06-13 DIAGNOSIS — J069 Acute upper respiratory infection, unspecified: Secondary | ICD-10-CM | POA: Insufficient documentation

## 2023-07-05 ENCOUNTER — Ambulatory Visit: Payer: Medicare Other | Admitting: Internal Medicine

## 2023-07-20 HISTORY — PX: TOTAL HIP ARTHROPLASTY: SHX124

## 2023-11-12 ENCOUNTER — Other Ambulatory Visit (HOSPITAL_COMMUNITY): Payer: Self-pay

## 2023-11-12 ENCOUNTER — Ambulatory Visit: Payer: Medicare Other | Attending: Internal Medicine | Admitting: Internal Medicine

## 2023-11-12 ENCOUNTER — Encounter: Payer: Self-pay | Admitting: Internal Medicine

## 2023-11-12 VITALS — BP 120/60 | HR 70 | Ht 74.0 in | Wt 260.0 lb

## 2023-11-12 DIAGNOSIS — I25118 Atherosclerotic heart disease of native coronary artery with other forms of angina pectoris: Secondary | ICD-10-CM

## 2023-11-12 DIAGNOSIS — G4733 Obstructive sleep apnea (adult) (pediatric): Secondary | ICD-10-CM | POA: Diagnosis not present

## 2023-11-12 DIAGNOSIS — I10 Essential (primary) hypertension: Secondary | ICD-10-CM

## 2023-11-12 DIAGNOSIS — I700 Atherosclerosis of aorta: Secondary | ICD-10-CM

## 2023-11-12 MED ORDER — RSV PRE-FUSION F A&B VAC RCMB 120 MCG/0.5ML IM SOLR
0.5000 mL | Freq: Once | INTRAMUSCULAR | 0 refills | Status: AC
  Filled 2023-11-12: qty 0.5, 1d supply, fill #0

## 2023-11-12 NOTE — Progress Notes (Signed)
 Cardiology Office Note:  .    Date:  11/12/2023  ID:  Howard [name], DOB [birth year], MRN [id number] PCP: Howard Hun, MD  [facility] Providers Cardiologist:  Howard Melody, MD     CC: Secondary Prevention- CAD  History of Present Illness: .    Howard [name] is a 68 y.o. male with coronary artery disease and hypertension who presents for a follow-up on secondary prevention and evaluation of cardiac symptoms. He is often accompanied by his wife to visits.  He has a history of minimal non-obstructive coronary artery disease, first identified in 2022 with a calcium  score of 112 and nonobstructive coronary disease in the mid LAD. An echocardiogram in 2022 showed an aorta within normal limits for his age and body surface area. He remains active with yard work and traveling but experiences shortness of breath during physical activities, which he attributes to being 'out of shape'. No chest pain is associated with the shortness of breath.  Hypertension is managed with amlodipine 10 mg PO daily and Diovan  HCT 320/25 mg PO daily, resulting in well-controlled blood pressure at 120/60 mmHg. He is on rosuvastatin  20 mg for cholesterol management, with recent labs showing triglycerides at 55 and LDL at 43. He also takes fish oil and turmeric. For secondary prevention, he takes aspirin 81 mg PO daily.  He reports a new onset of fine motor tremors, describing difficulty with writing legibly and using tools like a screwdriver. He notes a 'caressing tremor' and has an upcoming neurology appointment in August.  He underwent hip replacement surgery in 2024, which has significantly improved his joint pain. He continues to experience some joint pain but notes it is much better than before the surgery.  Discussed the use of AI scribe software for clinical note transcription with the patient, who gave verbal consent to proceed.   Relevant histories: .  Social  2022:  minimal non  obstructive CAD.  Normal EF.  2023: has PNA in February 2023. 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   Studies Reviewed: .     Cardiac Studies & Procedures   ______________________________________________________________________________________________     ECHOCARDIOGRAM  ECHOCARDIOGRAM COMPLETE 12/15/2020  Narrative ECHOCARDIOGRAM REPORT    Patient Name:   Howard [name] [facility] Date of Exam: 12/15/2020 Medical Rec #:  [id number]           Height:       74.0 in Accession #:    [id number]          Weight:       259.0 lb Date of Birth:  16-Oct-1955            BSA:          2.426 m Patient Age:    65 years            BP:           113/73 mmHg Patient Gender: M                   HR:           70 bpm. Exam Location:  Outpatient  Procedure: 2D Echo, Cardiac Doppler and Color Doppler  Indications:    CAD (coronary artery disease) [295284]  History:        Patient has prior history of Echocardiogram examinations, most recent  08/20/2017. CAD; Risk Factors:Hypertension, Sleep Apnea, Former Smoker, Diabetes and Dyslipidemia. GERD.  Sonographer:    Howard [name] RDCS Referring Phys: [id number] Howard [name]  IMPRESSIONS   1. Left ventricular ejection fraction, by estimation, is 60 to 65%. The left ventricle has normal function. The left ventricle has no regional wall motion abnormalities. Left ventricular diastolic parameters were normal. 2. Right ventricular systolic function is normal. The right ventricular size is normal. Tricuspid regurgitation signal is inadequate for assessing PA pressure. 3. The mitral valve is normal in structure. Trivial mitral valve regurgitation. No evidence of mitral stenosis. 4. The aortic valve is tricuspid. There is mild thickening of the aortic valve. Aortic valve regurgitation is not visualized. Mild aortic valve  sclerosis is present, with no evidence of aortic valve stenosis. 5. Aortic dilatation noted. There is mild dilatation of the ascending aorta, measuring 37 mm. 6. The inferior vena cava is normal in size with greater than 50% respiratory variability, suggesting right atrial pressure of 3 mmHg.  Comparison(s): Compared to prior echo in 2019, there is no significant change.  FINDINGS Left Ventricle: Left ventricular ejection fraction, by estimation, is 60 to 65%. The left ventricle has normal function. The left ventricle has no regional wall motion abnormalities. The left ventricular internal cavity size was normal in size. There is no left ventricular hypertrophy. Left ventricular diastolic parameters were normal.  Right Ventricle: The right ventricular size is normal. No increase in right ventricular wall thickness. Right ventricular systolic function is normal. Tricuspid regurgitation signal is inadequate for assessing PA pressure.  Left Atrium: Left atrial size was normal in size.  Right Atrium: Right atrial size was normal in size.  Pericardium: There is no evidence of pericardial effusion.  Mitral Valve: The mitral valve is normal in structure. Mild mitral annular calcification. Trivial mitral valve regurgitation. No evidence of mitral valve stenosis.  Tricuspid Valve: The tricuspid valve is normal in structure. Tricuspid valve regurgitation is trivial.  Aortic Valve: The aortic valve is tricuspid. There is mild thickening of the aortic valve. There is mild aortic valve annular calcification. Aortic valve regurgitation is not visualized. Mild aortic valve sclerosis is present, with no evidence of aortic valve stenosis.  Pulmonic Valve: The pulmonic valve was normal in structure. Pulmonic valve regurgitation is trivial.  Aorta: Aortic dilatation noted. There is mild dilatation of the ascending aorta, measuring 37 mm.  Venous: The inferior vena cava is normal in size with greater than  50% respiratory variability, suggesting right atrial pressure of 3 mmHg.  IAS/Shunts: No atrial level shunt detected by color flow Doppler.   LEFT VENTRICLE PLAX 2D LVIDd:         5.10 cm      Diastology LVIDs:         3.60 cm      LV e' medial:    9.25 cm/s LV PW:         0.90 cm      LV E/e' medial:  8.8 LV IVS:        0.90 cm      LV e' lateral:   10.30 cm/s LVOT diam:     2.10 cm      LV E/e' lateral: 7.9 LV SV:         85 LV SV Index:   35 LVOT Area:     3.46 cm  LV Volumes (MOD) LV vol d, MOD A2C: 191.0 ml LV vol d, MOD A4C: 146.0 ml LV vol s, MOD A2C:  84.5 ml LV vol s, MOD A4C: 47.2 ml LV SV MOD A2C:     106.5 ml LV SV MOD A4C:     146.0 ml LV SV MOD BP:      102.9 ml  RIGHT VENTRICLE RV S prime:     13.80 cm/s TAPSE (M-mode): 2.7 cm  LEFT ATRIUM             Index       RIGHT ATRIUM           Index LA diam:        3.80 cm 1.57 cm/m  RA Area:     15.70 cm LA Vol (A2C):   52.3 ml 21.56 ml/m RA Volume:   41.90 ml  17.27 ml/m LA Vol (A4C):   56.7 ml 23.37 ml/m LA Biplane Vol: 55.1 ml 22.71 ml/m AORTIC VALVE LVOT Vmax:   117.00 cm/s LVOT Vmean:  72.300 cm/s LVOT VTI:    0.246 m  AORTA Ao Root diam: 3.50 cm Ao Asc diam:  3.70 cm  MITRAL VALVE MV Area (PHT): 3.26 cm    SHUNTS MV Decel Time: 233 msec    Systemic VTI:  0.25 m MV E velocity: 81.20 cm/s  Systemic Diam: 2.10 cm MV A velocity: 78.10 cm/s MV E/A ratio:  1.04  Riccardo Chamberlain MD Electronically signed by Riccardo Chamberlain MD Signature Date/Time: 12/15/2020/3:11:44 PM    Final      CT SCANS  CT CORONARY MORPH W/CTA COR W/SCORE 12/08/2020  Addendum 12/08/2020  6:16 PM ADDENDUM REPORT: 12/08/2020 18:14  CLINICAL DATA:  12M with DOE  EXAM: Cardiac/Coronary CTA  TECHNIQUE: The patient was scanned on a [employer].  FINDINGS: A 100 kV prospective scan was triggered in the descending thoracic aorta at 111 HU's. Axial non-contrast 3 mm slices were carried out through the  heart. The data set was analyzed on a dedicated work station and scored using the Agatson method. Gantry rotation speed was 250 msecs and collimation was .6 mm. 0.8 mg of sl NTG was given. The 3D data set was reconstructed in 5% intervals of the 35-75 % of the R-R cycle. Phases were analyzed on a dedicated work station using MPR, MIP and VRT modes. The patient received 80 cc of contrast.  Aorta: Normal size.  No calcifications.  No dissection.  Aortic Valve:  Trileaflet.  No calcifications.  Coronary Arteries:  Normal coronary origin.  Right dominance.  RCA is a large dominant artery that gives rise to PDA and PLA. There is calcified plaque in the proximal RCA causing 0-24% stenosis. Calcified plaque in the mid RCA causes 0-24% stenosis. Calcified plaque in the distal RCA causes 0-24% stenosis  Left main is a large artery that gives rise to LAD and LCX arteries.  LAD is a large vessel. There is calcified plaque in the proximal LAD causing 0-24% stenosis. There is calcified plaque in the mid LAD causing 25-49% stenosis. There is calcified plaque in the distal LAD causing 25-49% stenosis.  LCX is a non-dominant artery that gives rise to one large OM1 branch. There is calcified plaque in the proximal LCX causing 0-24% stenosis.  Other findings:  Left Ventricle: Normal size  Left Atrium: Normal size  Pulmonary Veins: Normal configuration  Right Ventricle: Moderate enlargement  Right Atrium: Mild enlargement  Thoracic aorta: Normal size  Pulmonary Arteries: Normal size  Systemic Veins: Normal drainage  Pericardium: Normal thickness  IMPRESSION: 1. Coronary calcium  score of 317. This was 70th percentile  for age and sex matched control.  2. Normal coronary origin with right dominance.  3. Nonobstructive CAD. Most significant lesion is calcified plaque in mid LAD that causes mild (25-49%) stenosis  CAD-RADS 1. Minimal non-obstructive CAD (0-24%).  Consider non-atherosclerotic causes of chest pain. Consider preventive therapy and risk factor modification.   Electronically Signed By: Carson Clara MD On: 12/08/2020 18:14  Narrative EXAM: OVER-READ INTERPRETATION  CT CHEST  The following report is an over-read performed by radiologist Dr. Alexandria Angel of [facility] on 12/08/2020. This over-read does not include interpretation of cardiac or coronary anatomy or pathology. The coronary calcium  score/coronary CTA interpretation by the cardiologist is attached.  COMPARISON:  N chest CT 04/19/2020.  One.  FINDINGS: Atherosclerotic calcifications in the thoracic aorta. Within the visualized portions of the thorax there are no suspicious appearing pulmonary nodules or masses, there is no acute consolidative airspace disease, no pleural effusions, no pneumothorax and no lymphadenopathy. Visualized portions of the upper abdomen are unremarkable. There are no aggressive appearing lytic or blastic lesions noted in the visualized portions of the skeleton.  IMPRESSION: 1.  Aortic Atherosclerosis (ICD10-I70.0).  Electronically Signed: By: Alexandria Angel M.D. On: 12/08/2020 14:42   CT SCANS  CT CORONARY FRACTIONAL FLOW RESERVE DATA PREP 09/11/2017  Narrative EXAM: FF/RCT ANALYSIS  FINDINGS: FFRct analysis was performed on the original cardiac CT angiogram dataset. Diagrammatic representation of the FFRct analysis is provided in a separate PDF document in PACS. This dictation was created using the PDF document and an interactive 3D model of the results. 3D model is not available in the EMR/PACS. Normal FFR range is >0.80.  1. Left Main:  No significant stenosis.  2. LAD: No significant stenosis. 3. LCX: No significant stenosis. 4. RCA: No significant stenosis.  IMPRESSION: 1.  CT FFR analysis didn't show any significant stenosis.   Electronically Signed By: Christoper Crafts On: 09/12/2017  08:51   CT CORONARY MORPH W/CTA COR W/SCORE 09/11/2017  Addendum 09/11/2017  3:45 PM ADDENDUM REPORT: 09/11/2017 15:42  EXAM: OVER-READ INTERPRETATION  CT CHEST  The following report is an over-read performed by radiologist Dr. Mort Ards [facility] on 09/11/2017. This over-read does not include interpretation of cardiac or coronary anatomy or pathology. The coronary CTA interpretation by the cardiologist is attached.  COMPARISON:  None.  FINDINGS: No mediastinal or hilar adenopathy identified.  No pleural effusion. The lungs are clear. No airspace consolidation, atelectasis or mass.  Degenerative changes noted within the lower thoracic spine.  IMPRESSION: Negative over-read.   Electronically Signed By: Kimberley Penman M.D. On: 09/11/2017 15:42  Narrative CLINICAL DATA:  47 -year-old male with h/o obesity and DOE.  EXAM: Cardiac/Coronary  CT  TECHNIQUE: The patient was scanned on a [employer].  FINDINGS: A 120 kV prospective scan was triggered in the descending thoracic aorta at 111 HU's. Axial non-contrast 3 mm slices were carried out through the heart. The data set was analyzed on a dedicated work station and scored using the Agatson method. Gantry rotation speed was 250 msecs and collimation was .6 mm. 5 mg of iv Metoprolol  and 0.8 mg of sl NTG was given. The 3D data set was reconstructed in 5% intervals of the 67-82 % of the R-R cycle. Diastolic phases were analyzed on a dedicated work station using MPR, MIP and VRT modes. The patient received 80 cc of contrast.  Aorta:  Normal size.  No calcifications.  No dissection.  Aortic Valve:  Trileaflet.  No calcifications.  Coronary Arteries:  Normal coronary origin.  Right dominance.  RCA is a large dominant artery that gives rise to PDA and PLVB. There is diffuse scattered predominantly calcified plaque throughout the vessel with maximum stenosis 25-50%. PDA is poorly  visualized but with no obvious plaque.  Left main is a large artery that gives rise to LAD, ramus intermedius and LCX arteries. LM has no plaque.  LAD is a large vessel that gives rise to one diagonal artery. Ostial LAD has a mild calcified plaque with stenosis 25-50%. Mid LAD has moderate calcified plaque with stenosis 50-69%. Distal LAD has only minimal plaque.  D1 is medium size and has no significant plaque.  RI is a small artery with no obvious plaque.  LCX is a non-dominant artery that gives rise to one medium size OM1 branch. There is no plaque.  Other findings:  Normal pulmonary vein drainage into the left atrium.  Normal let atrial appendage without a thrombus.  Upper normal size of the pulmonary artery measuring 30 mm.  IMPRESSION: 1. Coronary calcium  score of 112. This was 20 percentile for age and sex matched control.  2. Normal coronary origin with right dominance.  3. Mild diffuse CAD in the RCA and ostial LAD, moderate CAD in the mid LAD. Additional analysis with CT FFR will be submitted.  Electronically Signed: By: Christoper Crafts On: 09/11/2017 13:56     ______________________________________________________________________________________________       Physical Exam:    VS:  BP 120/60   Pulse 70   Ht 6\' 2"  (1.88 m)   Wt 260 lb (117.9 kg)   SpO2 94%   BMI 33.38 kg/m    Wt Readings from Last 3 Encounters:  11/12/23 260 lb (117.9 kg)  04/25/23 258 lb (117 kg)  04/19/22 258 lb (117 kg)    Gen: no distress  Neck: No JVD Cardiac: No Rubs or Gallops, no murmur, RRR +2 radial pulses Respiratory: Clear to auscultation bilaterally, normal effort, normal  respiratory rate GI: Soft, nontender, non-distended  MS: No  edema;  moves all extremities Integument: Skin feels warm Neuro:  At time of evaluation, alert and oriented to person/place/time/situation  Psych: Normal affect, patient feels ok   ASSESSMENT AND PLAN: .    Coronary artery  disease HLD - Minimal non-obstructive coronary artery disease with a calcium  score of 112 noted in 2022. No significant progression or obstructive disease. He is asymptomatic with no chest pain or significant cardiac symptoms. Blood pressure and cholesterol levels are well controlled. Emphasis on secondary prevention and lifestyle modifications to maintain cardiovascular health. - Continue rosuvastatin  20 mg PO daily - Continue aspirin 81 mg PO daily for secondary prevention - Encourage 150 minutes of cardiovascular exercise per week as per AHA guidelines - Discuss the importance of non-weight bearing exercises and light strength training to maintain muscle tone and cardiovascular health - Offer follow-up in two years or as needed based on his preference  Hypertension Hypertension is well controlled with current medication regimen. Blood pressure is 120/60 mmHg. - Continue amlodipine 10 mg PO daily - Continue Diovan  HCT 320/25 mg PO daily - discussed exercise management  Obstructive sleep apnea - Obstructive sleep apnea managed with CPAP. No new concerns or symptoms.  Tremor Reports a fine motor tremor affecting writing and use of tools. Tremor is a resting tremor. Scheduled to see a neurologist in August for further evaluation. No immediate intervention planned as gait and overall function appear strong.  Offered graduation  from cardiology clinic Prefers longitudinal follow up- 2 years unless clinical change   Gloriann Larger, MD [facility] Cardiologist [facility]  [address] [place], [address] [place], [place] [address] [phone]  8:45 AM

## 2023-11-12 NOTE — Addendum Note (Signed)
 Addended by: Jamal Mays on: 11/12/2023 09:31 AM   Modules accepted: Orders

## 2023-11-12 NOTE — Patient Instructions (Signed)
 Medication Instructions:  No medication changes were made at this visit. Continue current regimen.    *If you need a refill on your cardiac medications before your next appointment, please call your pharmacy*  Lab Work: None ordered today. If you have labs (blood work) drawn today and your tests are completely normal, you will receive your results only by: MyChart Message (if you have MyChart) OR A paper copy in the mail If you have any lab test that is abnormal or we need to change your treatment, we will call you to review the results.  Testing/Procedures: None ordered today.  Follow-Up: At [facility], you and your health needs are our priority.  As part of our continuing mission to provide you with exceptional heart care, our providers are all part of one team.  This team includes your primary Cardiologist (physician) and Advanced Practice Providers or APPs (Physician Assistants and Nurse Practitioners) who all work together to provide you with the care you need, when you need it.  Your next appointment:   2 year(s)  Provider:   Jann Melody, MD

## 2023-11-26 ENCOUNTER — Encounter: Payer: Self-pay | Admitting: Internal Medicine

## 2024-02-05 ENCOUNTER — Telehealth: Payer: Self-pay | Admitting: Neurology

## 2024-02-05 ENCOUNTER — Telehealth: Payer: Self-pay

## 2024-02-05 NOTE — Telephone Encounter (Signed)
 Received a notification from [facility] that the patient is going to have left reverse total shoulder arthroplasty procedure competed. They wanted neurology clearance. We see the pt for OSA diagnosis and he is compliant on CPAP. Pt is cleared from neuro standpoint to have procedure and just notify anesthesia of the patient medical diagnosis as CPAP may need to be used when waking up from surgery and if kept overnight. Will have Amy NP review and sign for the pt.

## 2024-02-05 NOTE — Telephone Encounter (Signed)
   Name: Howard [name]  DOB: 1955/12/16  MRN: [id number]  Primary Cardiologist: Stanly DELENA Leavens, MD   Preoperative team, please contact this patient and set up a phone call appointment for further preoperative risk assessment. Please obtain consent and complete medication review. Thank you for your help.  I confirm that guidance regarding antiplatelet and oral anticoagulation therapy has been completed and, if necessary, noted below.  Per office protocol, if patient is without any new symptoms or concerns at the time of their virtual visit, he may hold Aspirin for 7 days prior to procedure. Please resume aspirin as soon as possible postprocedure, at the discretion of the surgeon.    I also confirmed the patient resides in the state of [place] . As per [facility] telemedicine laws, the patient must reside in the state in which the provider is licensed.   Howard Satterfield, NP 02/05/2024, 4:03 PM  [facility]

## 2024-02-05 NOTE — Telephone Encounter (Signed)
 Left message to call back to schedule tele pre op appt.

## 2024-02-05 NOTE — Telephone Encounter (Signed)
   Pre-operative Risk Assessment    Patient Name: Howard [name]  DOB: May 16, 1956 MRN: [id number]   Date of last office visit: 11/12/23 [facility], MD Date of next office visit: NONE   Request for Surgical Clearance    Procedure:  LEFT REVERSE TOTAL SHOULDER ARTHROPLASTY  Date of Surgery:  Clearance 03/10/24                                Surgeon:  EVA HERRING, MD Surgeon's Group or Practice Name:  [facility] Phone number:  [phone] Fax number:  [phone]   Type of Clearance Requested:   - Medical  - Pharmacy:  Hold Aspirin     Type of Anesthesia:  CHOICE   Additional requests/questions:    Bonney Lucie DELENA Alvia   02/05/2024, 3:54 PM

## 2024-02-06 ENCOUNTER — Telehealth: Payer: Self-pay

## 2024-02-06 NOTE — Telephone Encounter (Signed)
 Pt returning call

## 2024-02-06 NOTE — Telephone Encounter (Signed)
 Faxed completed form/last OV note, received fax confirmation

## 2024-02-06 NOTE — Telephone Encounter (Signed)
 Patient has been scheduled for televisit and consent done     Patient Consent for Virtual Visit         Howard [name] has provided verbal consent on 02/06/2024 for a virtual visit (video or telephone).   CONSENT FOR VIRTUAL VISIT FOR:  Howard [name]  By participating in this virtual visit I agree to the following:  I hereby voluntarily request, consent and authorize [facility] and its employed or contracted physicians, physician assistants, nurse practitioners or other licensed health care professionals (the Practitioner), to provide me with telemedicine health care services (the "Services) as deemed necessary by the treating Practitioner. I acknowledge and consent to receive the Services by the Practitioner via telemedicine. I understand that the telemedicine visit will involve communicating with the Practitioner through live audiovisual communication technology and the disclosure of certain medical information by electronic transmission. I acknowledge that I have been given the opportunity to request an in-person assessment or other available alternative prior to the telemedicine visit and am voluntarily participating in the telemedicine visit.  I understand that I have the right to withhold or withdraw my consent to the use of telemedicine in the course of my care at any time, without affecting my right to future care or treatment, and that the Practitioner or I may terminate the telemedicine visit at any time. I understand that I have the right to inspect all information obtained and/or recorded in the course of the telemedicine visit and may receive copies of available information for a reasonable fee.  I understand that some of the potential risks of receiving the Services via telemedicine include:  Delay or interruption in medical evaluation due to technological equipment failure or disruption; Information transmitted may not be sufficient (e.g. poor resolution of  images) to allow for appropriate medical decision making by the Practitioner; and/or  In rare instances, security protocols could fail, causing a breach of personal health information.  Furthermore, I acknowledge that it is my responsibility to provide information about my medical history, conditions and care that is complete and accurate to the best of my ability. I acknowledge that Practitioner's advice, recommendations, and/or decision may be based on factors not within their control, such as incomplete or inaccurate data provided by me or distortions of diagnostic images or specimens that may result from electronic transmissions. I understand that the practice of medicine is not an exact science and that Practitioner makes no warranties or guarantees regarding treatment outcomes. I acknowledge that a copy of this consent can be made available to me via my patient portal [facility] MyChart), or I can request a printed copy by calling the office of [facility].    I understand that my insurance will be billed for this visit.   I have read or had this consent read to me. I understand the contents of this consent, which adequately explains the benefits and risks of the Services being provided via telemedicine.  I have been provided ample opportunity to ask questions regarding this consent and the Services and have had my questions answered to my satisfaction. I give my informed consent for the services to be provided through the use of telemedicine in my medical care

## 2024-02-06 NOTE — Telephone Encounter (Signed)
 Called and spoke to patient who stated surgery is scheduled for November. Patient scheduled televisit

## 2024-03-05 NOTE — Telephone Encounter (Signed)
 2nd request received.  Pt is scheduled for TELE Preop appt 04/13/24.  Will update the surgeons office

## 2024-03-10 ENCOUNTER — Other Ambulatory Visit: Payer: Self-pay | Admitting: Internal Medicine

## 2024-03-10 DIAGNOSIS — Z720 Tobacco use: Secondary | ICD-10-CM

## 2024-04-13 ENCOUNTER — Ambulatory Visit

## 2024-04-13 NOTE — Telephone Encounter (Signed)
 Patient was scheduled for televisit for pending shoulder surgery today.  Patient notified me that he has canceled the procedure as he has had improvement in symptoms.  Will cancel televisit for today as cardiac clearance is no longer needed.

## 2024-04-22 ENCOUNTER — Other Ambulatory Visit

## 2024-04-27 ENCOUNTER — Ambulatory Visit: Payer: Medicare Other | Admitting: Family Medicine

## 2024-05-01 ENCOUNTER — Ambulatory Visit
Admission: RE | Admit: 2024-05-01 | Discharge: 2024-05-01 | Disposition: A | Discharge status: Home / Self Care | Source: Ambulatory Visit | Attending: Internal Medicine | Admitting: Internal Medicine

## 2024-05-01 DIAGNOSIS — Z720 Tobacco use: Secondary | ICD-10-CM

## 2024-05-09 ENCOUNTER — Other Ambulatory Visit (HOSPITAL_BASED_OUTPATIENT_CLINIC_OR_DEPARTMENT_OTHER): Payer: Self-pay

## 2024-05-20 NOTE — Progress Notes (Unsigned)
 Howard [name]

## 2024-05-20 NOTE — Patient Instructions (Signed)

## 2024-05-20 NOTE — Progress Notes (Unsigned)
 PATIENT: Howard [name] DOB: 1955-08-23  REASON FOR VISIT: follow up HISTORY FROM: patient  No chief complaint on file.    HISTORY OF PRESENT ILLNESS:  05/20/24 ALL: Howard [name] returns for follow up for OSA on CPAP.   Eligible for a new machine     04/25/2023 ALL:  Howard [name] returns for follow up for OSA on CPAP. He continues to do well on therapy. He is using CPAP nightly for about 6 hours, on average. He denies concerns with machine or supplies. He does admit that he does not change mask out as frequently as he should. He is not bothered by air leak. AHI well managed.     04/19/2022 ALL:  Howard [name] returns for follow up for OSA on CPAP. He is doing well on therapy. He is using CPAP most every night for about 6hr. He has had trouble with knee pain which has interrupted sleep. He is planning to have a left knee replacement next week. He is also having right hip and low back pain, likely due to compensation. He is followed closely by ortho. He continues to monitor for leak at home. He is unable to tolerate mask any tighter.     04/19/2021 ALL: Howard [name] returns for follow up for OSA on CPAP. He continues to do very well. He is using CPAP nightly for at least 4 hours. He admits that he has a higher air leak. He can not tolerate mask tight on his face. He feels well rested and energized during the day. He does not wish to have mask refitting. He may consider travel CPAP as he has a son in [place] and another in [place].   Set up date 06/17/2019    11/09/2019 ALL:  Howard [name] is a 68 y.o. male here today for follow up for OSA on CPAP. He was sent for mask refitting at last visit due to elevated leak.  He admits that although the DME company has reached out to him, he has not pursued a mask refitting.  He does continue to note a leak on his machine.  He has not used his machine to help identify correct mask seal.  He does continue to note improvement in sleep quality and daytime  energy.  Compliance report dated 10/06/2019 through 11/04/2019 reveals that he used CPAP 29 of the past 30 days for compliance of 97%.  He used CPAP greater than 4 hours 25 of the past 30 days for compliance of 83%.  Average usage was 5 hours and 44 minutes.  Residual AHI was 2.8 on 8 to 18 cm of water and an EPR of 1.  There is a significant leak in the 95th percentile of 67.3 L/min.  HISTORY: (copied from my note on 08/10/2019)  Howard [name] is a 68 y.o. male here today for follow up for OSA on CPAP. Sleep study in 04/2019 revealed severe OSA with AHI of 77.2 and hypoxemia for 153 minutes. O2 nadir of 62%. Autopap ordered with large FFM and optional humidity with pressure settings of 8-18cmH20.  He has noted significant improvements in sleep quality and improvement in fatigue since starting CPAP therapy.   Compliance report dated 07/11/2019 through 08/09/2019 reveals that he used CPAP 29 of the last 30 days for compliance of 97%.  He used CPAP greater than 4 hours 29 of the last 30 days.  Average usage was 6 hours and 58 minutes.  Residual AHI was 4.7 on 8 to 18 cm of water  and an EPR of 1.  There was a very high leak noted in the 95th percentile of 60.4 L/min.  He states that he has had a lot of difficulty with the air leaking around his mask.  He notes that he wakes in the morning with the bottom of the mask in his mouth.   HISTORY: (copied from Dr Lionell note on 04/14/2019)   Howard [name] is a 68 y.o. year old Caucasian male patient seen on 04/14/2019 upon referral from Dr. Shayne for a sleep consultation.  Chief concern according to patient :  I have a dental device but I am very sleepy    I have the pleasure of seeing Howard [name] today, a right-handed Caucasian male with a possible sleep disorder. He  has a past medical history of Anxiety, Borderline diabetes, Coronary artery disease, Disorder of tendon of right biceps, Family history of adverse reaction to anesthesia,  GERD (gastroesophageal reflux disease), Gout, History of esophageal spasm (2008), History of non-melanoma ( basalioma)  skin cancer, History of nuclear stress test (02/17/2009), Hyperlipidemia, Hypertension, Hypogonadism male, Hypothyroidism, Nocturia, OA (osteoarthritis), OSA (obstructive sleep apnea), and Right rotator cuff tear.   The patient had the first sleep study in the year 2010 at [facility] - severe apnea) - and later a home study through his dentist,  with a result of an AHI ( Apnea Hypopnea index)  Indicating OSA. He has been using a dental device since , but feels he is sleepier again.   Sleep relevant medical history: former smoker, shift worker, gout and arthritis pain, neck and toe, shoulder rotator cuff injuries, knee replacement with pain.  Nocturia twice-  No Tonsillectomy, cervical spine trauma, deviated septum repair. Family medical /sleep history: no other family member on CPAP with OSA, father was a snorer.     Social history: Howard [name] worked for [employer], Patient is retired from EMS/ stroke  and lives in a household with 2 persons. Family status is married with 2 adult sons, 58 and 31, and grandchildren. Both sons out of state.  The patient currently works/ used to work in shifts( chief technology officer,) Pets are present- 2 cats. Tobacco use: former , 25 years.  ETOH use: socially, 3/ week  Caffeine intake in form of Coffee( 3 cups  ) Soda( some ) Tea ( at outside dinner) or energy drinks. Regular exercise in form of walking/ in PT for the shoulder .     Sleep habits are as follows: The patient's dinner time is between 6- 6.30 PM. He watches Tv and often is asleep in the den in a recliner by 9.30/ The patient goes to bed at 11 PM and continues to sleep for several  hours, wakes for bathroom breaks, the first time at 2-3 AM and again 4-5 AM. .   The preferred sleep position is left side ( caveat is shoulder pain) , with the support of 1 pillow. Dreams are  reportedly frequent , but not acting out. He has called out in his sleep- rarely. 7 AM is the usual rise time.  The patient wakes up spontaneously.  Gets 8 hours of sleep- but reports not feeling refreshed or restored in AM, with symptoms such as dry mouth, no morning headaches, and residual fatigue.  Naps are taken after lunch  infrequently, lasting from 30 to 45 minutes and are more refreshing than nocturnal sleep.   REVIEW OF SYSTEMS: Out of a complete 14 system review of symptoms,  the patient complains only of the following symptoms, left knee pain, right hip pain and all other reviewed systems are negative.  ESS: 9/24, previously 15/24  ALLERGIES: Allergies  Allergen Reactions   Sulfa Antibiotics Rash    HOME MEDICATIONS: Outpatient Medications Prior to Visit  Medication Sig Dispense Refill   acetaminophen  (TYLENOL ) 650 MG CR tablet Take by mouth as needed.     allopurinol  (ZYLOPRIM ) 300 MG tablet Take 300 mg by mouth every morning.      amLODipine (NORVASC) 10 MG tablet Take 10 mg by mouth daily.     aspirin EC 81 MG tablet Take 81 mg by mouth daily.     buPROPion (WELLBUTRIN XL) 150 MG 24 hr tablet bupropion HCl XL 150 mg 24 hr tablet, extended release     celecoxib (CELEBREX) 200 MG capsule Take 200 mg by mouth daily.     Coenzyme Q10 (CO Q-10) 200 MG CAPS Take 1 tablet by mouth daily.     colchicine 0.6 MG tablet as needed.     COLLAGEN-VITAMIN C-BIOTIN PO Take 6,000 mg by mouth daily.     CRESTOR  20 MG tablet Take 10 mg by mouth daily.   0   doxazosin (CARDURA) 2 MG tablet Take 2 mg by mouth daily.     DULoxetine  (CYMBALTA ) 60 MG capsule Take 60 mg by mouth every morning.     famotidine  (PEPCID ) 20 MG tablet Take 20 mg by mouth daily.      levocetirizine (XYZAL) 5 MG tablet Take 2.5 mg by mouth daily.     levothyroxine  (SYNTHROID , LEVOTHROID) 175 MCG tablet Take 175 mcg by mouth every morning.      montelukast (SINGULAIR) 10 MG tablet Take 10 mg by mouth as needed for  allergies.     Multiple Vitamin (MULTIVITAMIN) tablet Take 1 tablet by mouth daily.     sildenafil (VIAGRA) 100 MG tablet Take 50 mg by mouth once as needed.      testosterone  (ANDROGEL ) 50 MG/5GM (1%) GEL Place 5 g onto the skin 2 (two) times daily.     Turmeric (QC TUMERIC COMPLEX PO) Take 1,000 mg by mouth daily.     valsartan -hydrochlorothiazide  (DIOVAN -HCT) 320-25 MG tablet Take 1 tablet by mouth daily.     No facility-administered medications prior to visit.    PAST MEDICAL HISTORY: Past Medical History:  Diagnosis Date   Anxiety    Borderline diabetes    Coronary artery disease    cardiologist-  dr jeffrie--- coronary CT 09-11-2017 showed non-obstructive mild diffuse cad RCA and ostial LAD and moderate midLAD,     Disorder of tendon of right biceps    dislocation   Family history of adverse reaction to anesthesia    FATHER REACTED TO LIDOCAINE     GERD (gastroesophageal reflux disease)    Gout    per pt stable   History of esophageal spasm 2008   History of nonmelanoma skin cancer    History of nuclear stress test 02/17/2009   normal , no ishemia w/ ef 60%   Hyperlipidemia    Hypertension    Hypogonadism male    followed by pcp   Hypothyroidism    followed by pcp   Nocturia    OA (osteoarthritis)    OSA (obstructive sleep apnea)    per pt has not used for severeal yrs, currently using mouth appliance   Right rotator cuff tear     PAST SURGICAL HISTORY: Past Surgical History:  Procedure Laterality Date  ANAL FISSURE REPAIR  yrs ago   CARPAL TUNNEL RELEASE  2012 /2013   bil   CATARACT EXTRACTION W/ INTRAOCULAR LENS  IMPLANT, BILATERAL  june 9 & 16-- 2020   INJECTION KNEE Left 12/29/2018   Procedure: LEFT KNEE ASPIRATION AND INJECTION;  Surgeon: Dozier Soulier, MD;  Location: [facility];  Service: Orthopedics;  Laterality: Left;   KNEE ARTHROSCOPY  12/12/2011   Procedure: ARTHROSCOPY KNEE;  Surgeon: Dempsey LULLA Moan, MD;  Location: [facility];   Service: Orthopedics;  Laterality: Left;  medial menisectomy and chondroplasty   KNEE ARTHROSCOPY Left 10/29/2012   Procedure: LEFT ARTHROSCOPY KNEE WITH CHONDROPLASTY AND DEBRIDMENT OF MENISCUS;  Surgeon: Dempsey LULLA Moan, MD;  Location: [facility];  Service: Orthopedics;  Laterality: Left;   KNEE SURGERY Bilateral right 1973 /  left 1985 /   right 1993   PLANTAR FASCIA SURGERY Left 08-02-2008    dr vickye  @[facility]    REPLACEMENT TOTAL HIP W/  RESURFACING IMPLANTS Right 04/18/2022   REPLACEMENT TOTAL KNEE Left 10/2022   SHOULDER ARTHROSCOPY WITH ROTATOR CUFF REPAIR Right 12/29/2018   Procedure: SHOULDER ARTHROSCOPY WITH ROTATOR CUFF REPAIR and  SUBACROMIAL DECOMPRESSION AND BICEP TENOTOMY;  Surgeon: Dozier Soulier, MD;  Location: [facility];  Service: Orthopedics;  Laterality: Right;   SHOULDER ARTHROSCOPY WITH ROTATOR CUFF REPAIR AND SUBACROMIAL DECOMPRESSION Right 04-09-2009   dr supple  @[facility]    w/ labral debridement and dcr   TOTAL KNEE ARTHROPLASTY Right 07/05/2014   Procedure: RIGHT TOTAL KNEE ARTHROPLASTY;  Surgeon: Dempsey Moan LULLA, MD;  Location: [facility];  Service: Orthopedics;  Laterality: Right;    FAMILY HISTORY: Family History  Problem Relation Age of Onset   Heart disease Father    Hypertension Father     SOCIAL HISTORY: Social History   Socioeconomic History   Marital status: Married    Spouse name: Not on file   Number of children: Not on file   Years of education: Not on file   Highest education level: Not on file  Occupational History   Not on file  Tobacco Use   Smoking status: Former    Current packs/day: 0.00    Types: Cigarettes    Start date: 12/06/1970    Quit date: 12/05/2000    Years since quitting: 23.4   Smokeless tobacco: Never  Vaping Use   Vaping status: Never Used  Substance and Sexual Activity   Alcohol  use: Yes    Comment: occassional   Drug use: Never   Sexual activity: Not on file  Other Topics Concern   Not on file  Social  History Narrative   Right Handed   4-5 Cups of Coffee per Day   2-3 Sodas per Day   Social Drivers of Health   Financial Resource Strain: Not on file  Food Insecurity: Not on file  Transportation Needs: Not on file  Physical Activity: Not on file  Stress: Not on file  Social Connections: Not on file  Intimate Partner Violence: Not on file      PHYSICAL EXAM  There were no vitals filed for this visit.     There is no height or weight on file to calculate BMI.  Generalized: Well developed, in no acute distress  Cardiology: normal rate and rhythm, no murmur noted Respiratory: clear to auscultation bilaterally  Neurological examination  Mentation: Alert oriented to time, place, history taking. Follows all commands speech and language fluent Cranial nerve II-XII: Pupils were equal round reactive to  light. Extraocular movements were full Motor: The motor testing reveals 5 over 5 strength of all 4 extremities. Good symmetric motor tone is noted throughout.  Gait and station: Gait is normal.   DIAGNOSTIC DATA (LABS, IMAGING, TESTING) - I reviewed patient records, labs, notes, testing and imaging myself where available.      No data to display           Lab Results  Component Value Date   WBC 4.1 04/18/2020   HGB 13.1 04/18/2020   HCT 38.5 (L) 04/18/2020   MCV 90.2 04/18/2020   PLT 190 04/18/2020      Component Value Date/Time   NA 135 12/05/2020 1001   K 3.8 12/05/2020 1001   CL 98 12/05/2020 1001   CO2 23 12/05/2020 1001   GLUCOSE 126 (H) 12/05/2020 1001   GLUCOSE 123 (H) 04/18/2020 2315   BUN 17 12/05/2020 1001   CREATININE 0.82 12/05/2020 1001   CALCIUM  9.4 12/05/2020 1001   PROT 7.7 06/29/2014 1125   ALBUMIN 4.4 06/29/2014 1125   AST 42 (H) 06/29/2014 1125   ALT 33 06/29/2014 1125   ALKPHOS 66 06/29/2014 1125   BILITOT 0.7 06/29/2014 1125   GFRNONAA >60 04/18/2020 2315   GFRAA 106 08/13/2017 1135   Lab Results  Component Value Date   CHOL 122  02/07/2010   HDL 39.70 02/07/2010   LDLCALC 67 02/07/2010   LDLDIRECT 180.9 11/19/2006   TRIG 79.0 02/07/2010   CHOLHDL 3 02/07/2010   Lab Results  Component Value Date   HGBA1C 6.3 02/07/2010   No results found for: VITAMINB12 Lab Results  Component Value Date   TSH 3.82 02/07/2010       ASSESSMENT AND PLAN 67 y.o. year old male  has a past medical history of Anxiety, Borderline diabetes, Coronary artery disease, Disorder of tendon of right biceps, Family history of adverse reaction to anesthesia, GERD (gastroesophageal reflux disease), Gout, History of esophageal spasm (2008), History of nonmelanoma skin cancer, History of nuclear stress test (02/17/2009), Hyperlipidemia, Hypertension, Hypogonadism male, Hypothyroidism, Nocturia, OA (osteoarthritis), OSA (obstructive sleep apnea), and Right rotator cuff tear. here with   No diagnosis found.   Howard continues to note benefit with CPAP therapy. Compliance report reveals optimal compliance. Leak remains elevated. He is happy with his full facemask and does not wish to try anything new.  He would like to continue working on tightening his headgear at home.  AHI is well managed. He was encouraged to continue using CPAP nightly and for greater than 4 hours each night.  He will follow up with me in 1 year, sooner if needed.  He verbalizes understanding and agreement with this plan.   No orders of the defined types were placed in this encounter.     No orders of the defined types were placed in this encounter.      Greig Forbes, FNP-C 05/20/2024, 8:53 AM [facility] [address] [place], [place] [address] [phone]

## 2024-05-21 ENCOUNTER — Encounter: Payer: Self-pay | Admitting: Family Medicine

## 2024-05-21 ENCOUNTER — Ambulatory Visit: Admitting: Family Medicine

## 2024-05-21 VITALS — BP 141/78 | Ht 73.0 in | Wt 243.0 lb

## 2024-05-21 DIAGNOSIS — G4733 Obstructive sleep apnea (adult) (pediatric): Secondary | ICD-10-CM

## 2024-05-21 DIAGNOSIS — D649 Anemia, unspecified: Secondary | ICD-10-CM | POA: Insufficient documentation

## 2024-07-24 ENCOUNTER — Encounter
